# Patient Record
Sex: Female | Born: 1990
Health system: Southern US, Community
[De-identification: ages and names within clinical notes are randomized; demographics above are authoritative.]

## PROBLEM LIST (undated history)

## (undated) DIAGNOSIS — O4402 Placenta previa specified as without hemorrhage, second trimester: Secondary | ICD-10-CM

## (undated) DIAGNOSIS — Z9189 Other specified personal risk factors, not elsewhere classified: Secondary | ICD-10-CM

## (undated) DIAGNOSIS — N926 Irregular menstruation, unspecified: Secondary | ICD-10-CM

## (undated) DIAGNOSIS — R51 Headache: Secondary | ICD-10-CM

## (undated) DIAGNOSIS — I499 Cardiac arrhythmia, unspecified: Secondary | ICD-10-CM

## (undated) DIAGNOSIS — G43909 Migraine, unspecified, not intractable, without status migrainosus: Secondary | ICD-10-CM

## (undated) DIAGNOSIS — R7612 Nonspecific reaction to cell mediated immunity measurement of gamma interferon antigen response without active tuberculosis: Secondary | ICD-10-CM

## (undated) DIAGNOSIS — R519 Headache, unspecified: Secondary | ICD-10-CM

## (undated) HISTORY — DX: Headache, unspecified: R51.9

## (undated) HISTORY — PX: WISDOM TOOTH EXTRACTION: SHX21

## (undated) HISTORY — DX: Migraine, unspecified, not intractable, without status migrainosus: G43.909

## (undated) HISTORY — DX: Other specified personal risk factors, not elsewhere classified: Z91.89

## (undated) HISTORY — DX: Headache: R51

## (undated) HISTORY — DX: Nonspecific reaction to cell mediated immunity measurement of gamma interferon antigen response without active tuberculosis: R76.12

## (undated) HISTORY — DX: Irregular menstruation, unspecified: N92.6

---
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
## 2013-03-16 ENCOUNTER — Emergency Department (HOSPITAL_COMMUNITY)
Admission: EM | Admit: 2013-03-16 | Discharge: 2013-03-16 | Disposition: A | Discharge status: Home / Self Care | Payer: No Typology Code available for payment source | Attending: Emergency Medicine | Admitting: Emergency Medicine

## 2013-03-16 ENCOUNTER — Encounter (HOSPITAL_COMMUNITY): Payer: Self-pay | Admitting: Emergency Medicine

## 2013-03-16 DIAGNOSIS — S0993XA Unspecified injury of face, initial encounter: Secondary | ICD-10-CM | POA: Insufficient documentation

## 2013-03-16 DIAGNOSIS — Y92410 Unspecified street and highway as the place of occurrence of the external cause: Secondary | ICD-10-CM | POA: Insufficient documentation

## 2013-03-16 DIAGNOSIS — Y9389 Activity, other specified: Secondary | ICD-10-CM | POA: Insufficient documentation

## 2013-03-16 DIAGNOSIS — M542 Cervicalgia: Secondary | ICD-10-CM

## 2013-03-16 DIAGNOSIS — Z79899 Other long term (current) drug therapy: Secondary | ICD-10-CM | POA: Insufficient documentation

## 2013-03-16 MED ORDER — CYCLOBENZAPRINE HCL 10 MG PO TABS: 10.0000 mg | ORAL_TABLET | Freq: Two times a day (BID) | ORAL | Status: DC | PRN

## 2013-03-16 NOTE — ED Provider Notes (Signed)
Medical screening examination/treatment/procedure(s) were performed by non-physician practitioner and as supervising physician I was immediately available for consultation/collaboration.   Makynna Manocchio, MD 03/16/13 1455 
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
## 2014-04-13 ENCOUNTER — Other Ambulatory Visit: Payer: Self-pay | Admitting: Occupational Medicine

## 2014-04-13 ENCOUNTER — Ambulatory Visit: Payer: Self-pay

## 2014-04-13 DIAGNOSIS — R7612 Nonspecific reaction to cell mediated immunity measurement of gamma interferon antigen response without active tuberculosis: Secondary | ICD-10-CM

## 2014-06-30 NOTE — L&D Delivery Note (Signed)
Delivery Note At 6:29 PM a viable female was delivered via Vaginal, Spontaneous Delivery (Presentation: Right Occiput Anterior).  APGAR: 8, 9; weight 8 lb 4.6 oz (3760 g).   Placenta status: Intact, Spontaneous.  Cord: 2 vessels with the following complications: None.  Cord pH: n/a  Anesthesia: Epidural  Episiotomy: None Lacerations: 1st degree Suture Repair: 4-0 Vicryl Est. Blood Loss (mL):    Mom to postpartum.  Baby to Couplet care / Skin to Skin.  Brennan Bailey 06/24/2015, 6:58 PM   I have reviewed the record and concur with patient management and plan. Halle Davlin, Hassell Done, MD, Cherlynn June

## 2014-11-20 ENCOUNTER — Encounter (HOSPITAL_COMMUNITY): Payer: Self-pay | Admitting: Emergency Medicine

## 2014-11-20 ENCOUNTER — Emergency Department (HOSPITAL_COMMUNITY)
Admission: EM | Admit: 2014-11-20 | Discharge: 2014-11-20 | Disposition: A | Discharge status: Home / Self Care | Payer: 59 | Source: Home / Self Care | Attending: Family Medicine | Admitting: Family Medicine

## 2014-11-20 DIAGNOSIS — J029 Acute pharyngitis, unspecified: Secondary | ICD-10-CM

## 2014-11-20 LAB — POCT RAPID STREP A: STREPTOCOCCUS, GROUP A SCREEN (DIRECT): NEGATIVE

## 2014-11-20 MED ORDER — AMOXICILLIN 500 MG PO CAPS: 500.0000 mg | ORAL_CAPSULE | Freq: Three times a day (TID) | ORAL | Status: DC

## 2014-11-20 NOTE — Discharge Instructions (Signed)
Thank you for coming in today. Call or go to the emergency room if you get worse, have trouble breathing, have chest pains, or palpitations.  Take amoxicillin if not better.  Continue tylenol or ibuprofen as needed.   Pharyngitis Pharyngitis is redness, pain, and swelling (inflammation) of your pharynx.  CAUSES  Pharyngitis is usually caused by infection. Most of the time, these infections are from viruses (viral) and are part of a cold. However, sometimes pharyngitis is caused by bacteria (bacterial). Pharyngitis can also be caused by allergies. Viral pharyngitis may be spread from person to person by coughing, sneezing, and personal items or utensils (cups, forks, spoons, toothbrushes). Bacterial pharyngitis may be spread from person to person by more intimate contact, such as kissing.  SIGNS AND SYMPTOMS  Symptoms of pharyngitis include:   Sore throat.   Tiredness (fatigue).   Low-grade fever.   Headache.  Joint pain and muscle aches.  Skin rashes.  Swollen lymph nodes.  Plaque-like film on throat or tonsils (often seen with bacterial pharyngitis). DIAGNOSIS  Your health care provider will ask you questions about your illness and your symptoms. Your medical history, along with a physical exam, is often all that is needed to diagnose pharyngitis. Sometimes, a rapid strep test is done. Other lab tests may also be done, depending on the suspected cause.  TREATMENT  Viral pharyngitis will usually get better in 3-4 days without the use of medicine. Bacterial pharyngitis is treated with medicines that kill germs (antibiotics).  HOME CARE INSTRUCTIONS   Drink enough water and fluids to keep your urine clear or pale yellow.   Only take over-the-counter or prescription medicines as directed by your health care provider:   If you are prescribed antibiotics, make sure you finish them even if you start to feel better.   Do not take aspirin.   Get lots of rest.   Gargle with  8 oz of salt water ( tsp of salt per 1 qt of water) as often as every 1-2 hours to soothe your throat.   Throat lozenges (if you are not at risk for choking) or sprays may be used to soothe your throat. SEEK MEDICAL CARE IF:   You have large, tender lumps in your neck.  You have a rash.  You cough up green, yellow-brown, or bloody spit. SEEK IMMEDIATE MEDICAL CARE IF:   Your neck becomes stiff.  You drool or are unable to swallow liquids.  You vomit or are unable to keep medicines or liquids down.  You have severe pain that does not go away with the use of recommended medicines.  You have trouble breathing (not caused by a stuffy nose). MAKE SURE YOU:   Understand these instructions.  Will watch your condition.  Will get help right away if you are not doing well or get worse. Document Released: 06/16/2005 Document Revised: 04/06/2013 Document Reviewed: 02/21/2013 [facility] Patient Information 2015 [place], [place]. This information is not intended to replace advice given to you by your health care provider. Make sure you discuss any questions you have with your health care provider.

## 2014-11-20 NOTE — ED Notes (Signed)
C/o ST onset 2 days associated w/fevers Denies cold sx Alert, no signs of acute distress.

## 2014-11-20 NOTE — ED Provider Notes (Signed)
Janet Thompson is a 24 y.o. female who presents to Urgent Care today for fever and sore throat present for the last few days. No vomiting diarrhea cough or congestion. She is currently [redacted] weeks pregnant. She's tried some Tylenol which helps. 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   History reviewed. No pertinent past medical history. History reviewed. No pertinent past surgical history. History  Substance Use Topics  . Smoking status: Never Smoker   . Smokeless tobacco: Not on file  . Alcohol Use: No   ROS as above Medications: No current facility-administered medications for this encounter.   Current Outpatient Prescriptions  Medication Sig Dispense Refill  . amoxicillin (AMOXIL) 500 MG capsule Take 1 capsule (500 mg total) by mouth 3 (three) times daily. 21 capsule 0  . [DISCONTINUED] Drospirenone-Ethinyl Estradiol-Levomefol (BEYAZ) 3-0.02-0.451 MG tablet Take 1 tablet by mouth daily.     No Known Allergies   Exam:  BP 117/74 mmHg  Pulse 110  Temp(Src) 99.6 F (37.6 C) (Oral)  Resp 16  SpO2 100%  LMP 03/17/2014 Gen: Well NAD HEENT: EOMI,  MMM posterior pharynx is erythematous with exudates bilaterally. Cervical lymphadenopathy is present bilaterally. Normal tympanic membranes bilaterally Lungs: Normal work of breathing. CTABL Heart: Mild tachycardia no MRG Abd: NABS, Soft. Nondistended, Nontender Exts: Brisk capillary refill, warm and well perfused.   Results for orders placed or performed during the hospital encounter of 11/20/14 (from the past 24 hour(s))  POCT rapid strep A [facility])     Status: None   Collection Time: 11/20/14  1:00 PM  Result Value Ref Range   Streptococcus, Group A Screen (Direct) NEGATIVE NEGATIVE   No results found.  Assessment and Plan: 24 y.o. female with pharyngitis. Treat with Tylenol. Prescribe amoxicillin. Patient will take amoxicillin if not better. Culture pending.  Discussed  warning signs or symptoms. Please see discharge instructions. Patient expresses understanding.     Gregor Hams, MD 11/20/14 [phone]

## 2014-11-22 LAB — CULTURE, GROUP A STREP: STREP A CULTURE: NEGATIVE

## 2014-12-25 ENCOUNTER — Encounter: Payer: Self-pay | Admitting: *Deleted

## 2014-12-27 ENCOUNTER — Ambulatory Visit (INDEPENDENT_AMBULATORY_CARE_PROVIDER_SITE_OTHER): Payer: 59 | Admitting: Obstetrics and Gynecology

## 2014-12-27 ENCOUNTER — Encounter: Payer: Self-pay | Admitting: Obstetrics and Gynecology

## 2014-12-27 VITALS — BP 116/76 | HR 79 | Ht 68.0 in | Wt 122.9 lb

## 2014-12-27 DIAGNOSIS — Z3492 Encounter for supervision of normal pregnancy, unspecified, second trimester: Secondary | ICD-10-CM

## 2014-12-27 LAB — POCT URINALYSIS DIPSTICK
Bilirubin, UA: NEGATIVE
Glucose, UA: NEGATIVE
Ketones, UA: NEGATIVE
LEUKOCYTES UA: NEGATIVE
Nitrite, UA: NEGATIVE
PH UA: 5
RBC UA: NEGATIVE
Spec Grav, UA: 1.025
Urobilinogen, UA: 0.2

## 2014-12-27 NOTE — Progress Notes (Signed)
Pt c/o nausea, fatigue

## 2014-12-27 NOTE — Patient Instructions (Signed)
  Place 10-18 weeks prenatal visit patient instructions here.

## 2014-12-27 NOTE — Progress Notes (Signed)
NOB PE & labs- 24yo MWF happy about current pregnancy; G1P0000; reviewed routine prenatal visit. Desires panarama and CFP- obtained today. 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  NEW OB HISTORY AND PHYSICAL  SUBJECTIVE:       Janet Thompson is a 24 y.o. G1P0 female, Patient's last menstrual period was 09/26/2014 (lmp unknown)., Estimated Date of Delivery: 07/03/15, [redacted]w[redacted]d, presents today for establishment of Prenatal Care. She has no unusual complaints and complains of nausea without vomiting for entire first trimester      Gynecologic History Patient's last menstrual period was 09/26/2014 (lmp unknown). Normal Contraception: none Last Pap: 2014. Results were: normal  Obstetric History OB History  Gravida Para Term Preterm AB SAB TAB Ectopic Multiple Living  1             # Outcome Date GA Lbr Len/2nd Weight Sex Delivery Anes PTL Lv  1 Current               Past Medical History  Diagnosis Date  . Menstrual irregularity     Past Surgical History  Procedure Laterality Date  . Wisdom tooth extraction      Current Outpatient Prescriptions on File Prior to Visit  Medication Sig Dispense Refill  . Prenatal Vit-Fe Fumarate-FA (PRENATAL MULTIVITAMIN) TABS tablet Take 1 tablet by mouth daily at 12 noon.    Marland Kitchen amoxicillin (AMOXIL) 500 MG capsule Take 1 capsule (500 mg total) by mouth 3 (three) times daily. (Patient not taking: Reported on 12/27/2014) 21 capsule 0  . [DISCONTINUED] Drospirenone-Ethinyl Estradiol-Levomefol (BEYAZ) 3-0.02-0.451 MG tablet Take 1 tablet by mouth daily.     No current facility-administered medications on file prior to visit.    No Known Allergies  History   Social History  . Marital Status: Single    Spouse Name: N/A  . Number of Children: N/A  . Years of Education: N/A   Occupational History  . Not on file.   Social History Main Topics  . Smoking status: Never Smoker   . Smokeless tobacco: Never  Used  . Alcohol Use: No  . Drug Use: No  . Sexual Activity: Yes    Birth Control/ Protection: None   Other Topics Concern  . Not on file   Social History Narrative    Family History  Problem Relation Age of Onset  . Cancer Maternal Aunt   . Alcohol abuse Maternal Grandfather     colon    The following portions of the patient's history were reviewed and updated as appropriate: allergies, current medications, past OB history, past medical history, past surgical history, past family history, past social history, and problem list.    OBJECTIVE: Initial Physical Exam (New OB)  GENERAL APPEARANCE: alert, well appearing, in no apparent distress HEAD: normocephalic, atraumatic MOUTH: mucous membranes moist, pharynx normal without lesions THYROID: no thyromegaly or masses present BREASTS: no masses noted, no significant tenderness, no palpable axillary nodes, no skin changes LUNGS: clear to auscultation, no wheezes, rales or rhonchi, symmetric air entry HEART: regular rate and rhythm, no murmurs ABDOMEN: soft, nontender, nondistended, no abnormal masses, no epigastric pain EXTREMITIES: no redness or tenderness in the calves or thighs SKIN: normal coloration and turgor, no rashes LYMPH NODES: no adenopathy palpable NEUROLOGIC: alert, oriented, normal speech, no focal findings or movement disorder noted  PELVIC EXAM EXTERNAL GENITALIA: normal appearing vulva with no masses, tenderness or lesions VAGINA: no abnormal discharge or lesions CERVIX: no lesions or cervical  motion tenderness UTERUS: gravid and consistent with 13 weeks ADNEXA: no masses palpable and nontender  ASSESSMENT: Normal pregnancy  PLAN: Prenatal care See orders

## 2014-12-28 ENCOUNTER — Other Ambulatory Visit: Payer: 59

## 2014-12-28 ENCOUNTER — Other Ambulatory Visit: Payer: Self-pay | Admitting: *Deleted

## 2014-12-28 DIAGNOSIS — Z3492 Encounter for supervision of normal pregnancy, unspecified, second trimester: Secondary | ICD-10-CM

## 2014-12-30 LAB — URINE CULTURE: ORGANISM ID, BACTERIA: NO GROWTH

## 2014-12-30 LAB — HEP, RPR, HIV PANEL
HIV SCREEN 4TH GENERATION: NONREACTIVE
Hepatitis B Surface Ag: NEGATIVE
RPR Ser Ql: NONREACTIVE

## 2014-12-30 LAB — CBC WITH DIFFERENTIAL/PLATELET
BASOS ABS: 0 10*3/uL (ref 0.0–0.2)
BASOS: 1 %
EOS (ABSOLUTE): 0.1 10*3/uL (ref 0.0–0.4)
Eos: 1 %
HEMOGLOBIN: 13.4 g/dL (ref 11.1–15.9)
Hematocrit: 39.2 % (ref 34.0–46.6)
IMMATURE GRANULOCYTES: 0 %
Immature Grans (Abs): 0 10*3/uL (ref 0.0–0.1)
LYMPHS ABS: 1.5 10*3/uL (ref 0.7–3.1)
LYMPHS: 24 %
MCH: 30.7 pg (ref 26.6–33.0)
MCHC: 34.2 g/dL (ref 31.5–35.7)
MCV: 90 fL (ref 79–97)
MONOCYTES: 6 %
MONOS ABS: 0.4 10*3/uL (ref 0.1–0.9)
Neutrophils Absolute: 4.1 10*3/uL (ref 1.4–7.0)
Neutrophils: 68 %
PLATELETS: 220 10*3/uL (ref 150–379)
RBC: 4.36 x10E6/uL (ref 3.77–5.28)
RDW: 13.8 % (ref 12.3–15.4)
WBC: 6.1 10*3/uL (ref 3.4–10.8)

## 2014-12-30 LAB — URINALYSIS, ROUTINE W REFLEX MICROSCOPIC
BILIRUBIN UA: NEGATIVE
GLUCOSE, UA: NEGATIVE
Ketones, UA: NEGATIVE
LEUKOCYTES UA: NEGATIVE
NITRITE UA: NEGATIVE
PH UA: 7.5 (ref 5.0–7.5)
PROTEIN UA: NEGATIVE
RBC, UA: NEGATIVE
Specific Gravity, UA: 1.016 (ref 1.005–1.030)
Urobilinogen, Ur: 0.2 mg/dL (ref 0.2–1.0)

## 2014-12-30 LAB — RUBELLA SCREEN: Rubella Antibodies, IGG: 1.32 index (ref >0.99)

## 2014-12-30 LAB — ABO AND RH: RH TYPE: POSITIVE

## 2014-12-30 LAB — VARICELLA ZOSTER ANTIBODY, IGG: VARICELLA: 487 {index} (ref >165)

## 2014-12-30 LAB — ANTIBODY SCREEN: ANTIBODY SCREEN: NEGATIVE

## 2015-01-02 LAB — GC/CHLAMYDIA PROBE AMP
CHLAMYDIA, DNA PROBE: NEGATIVE
NEISSERIA GONORRHOEAE BY PCR: NEGATIVE

## 2015-01-03 ENCOUNTER — Encounter: Payer: Self-pay | Admitting: Obstetrics and Gynecology

## 2015-01-11 ENCOUNTER — Encounter: Payer: Self-pay | Admitting: Obstetrics and Gynecology

## 2015-01-24 ENCOUNTER — Encounter: Payer: Self-pay | Admitting: Obstetrics and Gynecology

## 2015-01-24 ENCOUNTER — Ambulatory Visit (INDEPENDENT_AMBULATORY_CARE_PROVIDER_SITE_OTHER): Payer: 59 | Admitting: Obstetrics and Gynecology

## 2015-01-24 VITALS — BP 109/76 | HR 101 | Wt 123.5 lb

## 2015-01-24 DIAGNOSIS — Z3492 Encounter for supervision of normal pregnancy, unspecified, second trimester: Secondary | ICD-10-CM

## 2015-01-24 LAB — POCT URINALYSIS DIPSTICK
Blood, UA: NEGATIVE
GLUCOSE UA: NEGATIVE
Ketones, UA: NEGATIVE
LEUKOCYTES UA: NEGATIVE
Nitrite, UA: NEGATIVE
PH UA: 6
Spec Grav, UA: 1.015
Urobilinogen, UA: 0.2

## 2015-01-24 NOTE — Progress Notes (Signed)
Pt is doing well, denies any complaints

## 2015-01-24 NOTE — Progress Notes (Signed)
ROB- reviewed labs- all normal- panarama revealed low risk female infant. Anatomy scan next visit.

## 2015-02-09 ENCOUNTER — Ambulatory Visit: Payer: 59

## 2015-02-09 DIAGNOSIS — Z3492 Encounter for supervision of normal pregnancy, unspecified, second trimester: Secondary | ICD-10-CM

## 2015-02-21 ENCOUNTER — Other Ambulatory Visit: Payer: Self-pay | Admitting: Obstetrics and Gynecology

## 2015-02-21 DIAGNOSIS — Q638 Other specified congenital malformations of kidney: Secondary | ICD-10-CM

## 2015-02-23 ENCOUNTER — Ambulatory Visit (INDEPENDENT_AMBULATORY_CARE_PROVIDER_SITE_OTHER): Payer: 59 | Admitting: Obstetrics and Gynecology

## 2015-02-23 ENCOUNTER — Other Ambulatory Visit: Payer: 59

## 2015-02-23 ENCOUNTER — Encounter: Payer: Self-pay | Admitting: Obstetrics and Gynecology

## 2015-02-23 VITALS — BP 101/71 | HR 90 | Wt 129.4 lb

## 2015-02-23 DIAGNOSIS — Q620 Congenital hydronephrosis: Secondary | ICD-10-CM | POA: Diagnosis not present

## 2015-02-23 DIAGNOSIS — Z331 Pregnant state, incidental: Secondary | ICD-10-CM | POA: Diagnosis not present

## 2015-02-23 DIAGNOSIS — Q638 Other specified congenital malformations of kidney: Secondary | ICD-10-CM

## 2015-02-23 LAB — POCT URINALYSIS DIPSTICK
Bilirubin, UA: NEGATIVE
Glucose, UA: NEGATIVE
Ketones, UA: NEGATIVE
LEUKOCYTES UA: NEGATIVE
NITRITE UA: NEGATIVE
PH UA: 6
RBC UA: NEGATIVE
Spec Grav, UA: 1.015
UROBILINOGEN UA: 0.2

## 2015-02-23 NOTE — Progress Notes (Signed)
ROB- reviewed previous anatomy scan findings, will refer to [facility] Follow-up scan today reveals: Indications:F/U Anatomy Findings:  Singleton intrauterine pregnancy is visualized with FHR at 153 BPM. Clinically established EDD of 07/03/2015 at 21 3/7. Growth is not performed today.   Fetal presentation is Vertex.  EFW: not performed. Placenta: anterior and remote from cervix. AFI: appears adequate.  Anatomic survey is complete; Gender - female  .   Survey of the adnexa demonstrates no adnexal masses. There is no free peritoneal fluid in the cul de sac.  Right renal pelvis measures 4.45mm and the left renal pelvis measures 5.1 mm.   Impression: 1. 21 3/7 week Viable Singleton Intrauterine pregnancy by LMP. 2. (U/S) EDD is consistent with Clinically established (LMP) EDD of 07/03/2015. 3. Renal pelvis are dilated bilaterally.

## 2015-02-23 NOTE — Progress Notes (Signed)
ROB-denies any changes, pt is doing well

## 2015-02-23 NOTE — Patient Instructions (Signed)
Single umbilical artery     Occasionally, there is only the one single umbilical artery (SUA) present in the umbilical cord.[1] Approximately this affects between 1 in 100 and 1 in 500 pregnancies, making it the most common umbilical abnormality. It is more common in multiple births. Its cause is not known.  Most cords have one vein and two arteries. The vein carries oxygenated blood from the placenta to the baby and the arteries carry deoxygenated blood from the baby to the placenta. In approximately 1% of pregnancies there are only two vessels -usually a single vein and single artery. In about 75% of those cases, the baby is entirely normal and healthy and the missing artery isn't missed at all. One artery can support a pregnancy and does not necessarily indicate problems. For the other 25%, a 2-vessel cord is a sign that the baby has other abnormalities-sometimes life-threatening and sometimes not.[2] SUA does increase the risk of the baby having cardiac, skeletal, intestinal or renal problems.[3] Babies with SUA may have a higher likelihood of having other congenital abnormalities, especially of the heart. However, additional testing (high level ultrasound scans) can rule out many of these abnormalities prior to birth and alleviate parental anxiety. Echocardiograms of the fetus may be advised to ensure the heart is functioning properly. Genetic counseling may be useful, too, especially when weighing the pros and cons of more invasive procedures such as chorionic villus sampling and amniocentesis.  Although the presence of an SUA is a risk factor for additional complications, most fetuses with the condition will not experience other problems, either in utero or after birth. Especially encouraging are cases in which no other soft markers for congenital abnormalities are visible via ultrasound. Prior to ultrasound technology, the only method for determining the presence of a SUA was at birth, following an  examination of the placenta. Given that the vast majority of expectant mothers do not receive the kind of advanced ultrasound scanning required to confirm SUA in utero, most cases may never be detected antenatally even today. Doctors and midwives often suggest parents take the added precaution of having regular growth scans near term to rule out intrauterine growth restriction, which can happen on occasion and warrant intervention. Yet the majority of growth restricted infants with the abnormality also have other defects. Finally, neonates with the finding may also have a higher occurrence of renal problems, therefore close examination of the infant may be warranted shortly after birth. Among SUA infants, there is a slightly elevated risk for post-natal urinary infections. It may be associated with Edwards syndrome.

## 2015-02-28 ENCOUNTER — Other Ambulatory Visit: Payer: Self-pay | Admitting: Obstetrics and Gynecology

## 2015-03-08 ENCOUNTER — Other Ambulatory Visit: Payer: Self-pay

## 2015-03-08 ENCOUNTER — Ambulatory Visit: Payer: 59

## 2015-03-08 DIAGNOSIS — IMO0001 Reserved for inherently not codable concepts without codable children: Secondary | ICD-10-CM

## 2015-03-12 ENCOUNTER — Ambulatory Visit (HOSPITAL_BASED_OUTPATIENT_CLINIC_OR_DEPARTMENT_OTHER)
Admission: RE | Admit: 2015-03-12 | Discharge: 2015-03-12 | Disposition: A | Discharge status: Home / Self Care | Payer: 59 | Source: Ambulatory Visit | Attending: Maternal & Fetal Medicine | Admitting: Maternal & Fetal Medicine

## 2015-03-12 ENCOUNTER — Ambulatory Visit
Admission: RE | Admit: 2015-03-12 | Discharge: 2015-03-12 | Disposition: A | Discharge status: Home / Self Care | Payer: 59 | Source: Ambulatory Visit | Attending: Maternal & Fetal Medicine | Admitting: Maternal & Fetal Medicine

## 2015-03-12 DIAGNOSIS — Z3A23 23 weeks gestation of pregnancy: Secondary | ICD-10-CM | POA: Diagnosis not present

## 2015-03-12 DIAGNOSIS — O35EXX0 Maternal care for other (suspected) fetal abnormality and damage, fetal genitourinary anomalies, not applicable or unspecified: Secondary | ICD-10-CM | POA: Insufficient documentation

## 2015-03-12 DIAGNOSIS — Z3A19 19 weeks gestation of pregnancy: Secondary | ICD-10-CM | POA: Diagnosis not present

## 2015-03-12 DIAGNOSIS — O26892 Other specified pregnancy related conditions, second trimester: Secondary | ICD-10-CM | POA: Insufficient documentation

## 2015-03-12 DIAGNOSIS — IMO0001 Reserved for inherently not codable concepts without codable children: Secondary | ICD-10-CM

## 2015-03-12 DIAGNOSIS — O358XX0 Maternal care for other (suspected) fetal abnormality and damage, not applicable or unspecified: Secondary | ICD-10-CM | POA: Diagnosis not present

## 2015-03-12 DIAGNOSIS — O09899 Supervision of other high risk pregnancies, unspecified trimester: Secondary | ICD-10-CM | POA: Insufficient documentation

## 2015-03-12 NOTE — Progress Notes (Signed)
[employer] Consultation   Chief Complaint: Single umbilical artery and fetal bilateral renal pelviectasis  HPI: [name] is a 24 y.o. G1P0 at [redacted]w[redacted]d by LMP and 19 week [place] who presents in consultation from [facility] for single umbilical artery and fetal bilateral renal pelviectasis noted on 20 week anatomy ultrasound and follow-up anatomy ultrasound 2 weeks later.  Gynecologic History:  Menstrual irregularity  Past Medical History: Benign  Past Surgical History: She  has past surgical history that includes Wisdom tooth extraction.   Medications: PN vitamins  Allergies: Patient has No Known Allergies.   Social History: Patient  reports that she has never smoked. She has never used smokeless tobacco. She reports that she does not drink alcohol or use illicit drugs. 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   Family History: family history includes Alcohol abuse in her maternal grandfather; Cancer in her maternal aunt.   Review of Systems A full 12 point review of systems was negative or as noted in the History of Present Illness.  Physical Exam: T 98.4, HR 92 BP 119/75, Wt 133  On ultrasound today, with the exception of the fetal kidneys, fetal anatomy is visualized and appears normal.  There is bilateral renal pelviectasis measuring 6.6 mm on the left and 6.2 mm on the right.  Only one umbilical artery was visualized.  Asessement:24 yo gravida 1 at [redacted]w[redacted]d with: 1. Bilateral fetal renal pelviectasis 2. Single umbilical artery 3. Consideration of underlying genetic cause(s) of above  Recommendations: 1. Bilateral fetal renal pelviectasis -- The patient and her husband were counseled that renal pelviectasis is a common finding that can improve, worsen or remain stable over the course of the pregnancy.  They were counseled that while this is a common finding, the finding may be more common in aneuploidy.  In this  pregnancy, however, cffDNA has been normal.   -- The couple were counseled that the finding can be due to normal variation, a functional problem, or a mechanical obstruction.   Should the pelviectasis persist  throughout pregnancy, we would recommend postnatal follow-up.  Occasionally, postnatal treatment is required, but rarely is surgery necessary. Should the pelviectasis devolve into overt hydronephrosis, we would suggest consultation with pediatric urology during pregnancy.  2. Single umbilical artery -- The couple were counseled about the association with utero-placental insufficiency.  We would recommend monthly ultrasounds for growth and weekly testing starting at 34-36 weeks gesation with delivery by 40 weeks. -- Because of the pelviectasis, we have scheduled her next ultrasound for growth here in 4-5 weeks.  -- The couple were offered fetal echocardiography to more completely exclude a fetal heart defect and declined.   3. Consideration of underlying genetic cause(s) of  --  The couple was introduced to our genetic counselor and were offered genetic amniocentesis to more completely exclude aneuploidy.  They declined.    Total time spent with the patient was 30 minutes with greater than 50% spent in counseling and coordination of care.   Erasmo Score, MD [facility]

## 2015-03-21 ENCOUNTER — Ambulatory Visit (INDEPENDENT_AMBULATORY_CARE_PROVIDER_SITE_OTHER): Payer: 59 | Admitting: Obstetrics and Gynecology

## 2015-03-21 ENCOUNTER — Encounter: Payer: 59 | Admitting: Obstetrics and Gynecology

## 2015-03-21 VITALS — BP 105/74 | HR 74 | Wt 133.5 lb

## 2015-03-21 DIAGNOSIS — Z23 Encounter for immunization: Secondary | ICD-10-CM

## 2015-03-21 MED ORDER — INFLUENZA VAC SPLIT QUAD 0.5 ML IM SUSY
0.5000 mL | PREFILLED_SYRINGE | Freq: Once | INTRAMUSCULAR | Status: AC
  Administered 2015-03-21: 0.5 mL via INTRAMUSCULAR

## 2015-03-21 NOTE — Progress Notes (Signed)
ROB- reviewed info from [facility] visit- no questions; glucola next visit

## 2015-03-21 NOTE — Progress Notes (Signed)
Flu vaccine today 

## 2015-03-30 ENCOUNTER — Encounter: Payer: 59 | Admitting: Obstetrics and Gynecology

## 2015-04-10 ENCOUNTER — Other Ambulatory Visit: Payer: Self-pay | Admitting: *Deleted

## 2015-04-10 DIAGNOSIS — Z3493 Encounter for supervision of normal pregnancy, unspecified, third trimester: Secondary | ICD-10-CM

## 2015-04-10 DIAGNOSIS — Z131 Encounter for screening for diabetes mellitus: Secondary | ICD-10-CM

## 2015-04-11 ENCOUNTER — Ambulatory Visit (INDEPENDENT_AMBULATORY_CARE_PROVIDER_SITE_OTHER): Payer: 59 | Admitting: Obstetrics and Gynecology

## 2015-04-11 ENCOUNTER — Encounter: Payer: Self-pay | Admitting: Obstetrics and Gynecology

## 2015-04-11 ENCOUNTER — Encounter: Payer: 59 | Admitting: Obstetrics and Gynecology

## 2015-04-11 VITALS — BP 114/64 | HR 87 | Wt 138.9 lb

## 2015-04-11 DIAGNOSIS — Z23 Encounter for immunization: Secondary | ICD-10-CM

## 2015-04-11 DIAGNOSIS — Z3493 Encounter for supervision of normal pregnancy, unspecified, third trimester: Secondary | ICD-10-CM | POA: Diagnosis not present

## 2015-04-11 DIAGNOSIS — Z131 Encounter for screening for diabetes mellitus: Secondary | ICD-10-CM

## 2015-04-11 LAB — POCT URINALYSIS DIPSTICK
Bilirubin, UA: NEGATIVE
Blood, UA: NEGATIVE
GLUCOSE UA: NEGATIVE
KETONES UA: NEGATIVE
Leukocytes, UA: NEGATIVE
Nitrite, UA: NEGATIVE
PH UA: 7
SPEC GRAV UA: 1.015
Urobilinogen, UA: 0.2

## 2015-04-11 MED ORDER — TETANUS-DIPHTH-ACELL PERTUSSIS 5-2.5-18.5 LF-MCG/0.5 IM SUSP
0.5000 mL | Freq: Once | INTRAMUSCULAR | Status: AC
  Administered 2015-04-11: 0.5 mL via INTRAMUSCULAR

## 2015-04-11 NOTE — Progress Notes (Cosign Needed)
ROB-pt denies any new complaints, tdap given and blood consent signed, glucola done Pt actually left due to MNB @ l/d, will make another appt

## 2015-04-12 ENCOUNTER — Ambulatory Visit (INDEPENDENT_AMBULATORY_CARE_PROVIDER_SITE_OTHER): Payer: 59 | Admitting: Obstetrics and Gynecology

## 2015-04-12 ENCOUNTER — Encounter: Payer: Self-pay | Admitting: Obstetrics and Gynecology

## 2015-04-12 ENCOUNTER — Telehealth: Payer: Self-pay | Admitting: *Deleted

## 2015-04-12 VITALS — BP 112/67 | HR 87 | Wt 136.9 lb

## 2015-04-12 DIAGNOSIS — Z3493 Encounter for supervision of normal pregnancy, unspecified, third trimester: Secondary | ICD-10-CM

## 2015-04-12 LAB — POCT URINALYSIS DIPSTICK
BILIRUBIN UA: NEGATIVE
Blood, UA: NEGATIVE
GLUCOSE UA: NEGATIVE
Ketones, UA: NEGATIVE
LEUKOCYTES UA: NEGATIVE
NITRITE UA: NEGATIVE
Spec Grav, UA: 1.015
UROBILINOGEN UA: 0.2
pH, UA: 6.5

## 2015-04-12 LAB — HEMOGLOBIN AND HEMATOCRIT, BLOOD
Hematocrit: 33.4 % — ABNORMAL LOW (ref 34.0–46.6)
Hemoglobin: 11.2 g/dL (ref 11.1–15.9)

## 2015-04-12 LAB — GLUCOSE, 1 HOUR GESTATIONAL: Gestational Diabetes Screen: 118 mg/dL (ref 65–139)

## 2015-04-12 NOTE — Telephone Encounter (Signed)
Notified pt of normal lab results 

## 2015-04-12 NOTE — Progress Notes (Signed)
ROB- denies any new complaints

## 2015-04-12 NOTE — Progress Notes (Signed)
ROB- glucola done yesterday; info on cord blood donation given

## 2015-04-12 NOTE — Telephone Encounter (Signed)
-----   Message from Evonnie Pat, [place] sent at 04/12/2015  9:47 AM EDT ----- Please let her know she passed her glucola and no signs of anemia

## 2015-04-16 ENCOUNTER — Ambulatory Visit: Payer: 59

## 2015-04-23 ENCOUNTER — Other Ambulatory Visit: Payer: Self-pay

## 2015-04-23 ENCOUNTER — Ambulatory Visit
Admission: RE | Admit: 2015-04-23 | Discharge: 2015-04-23 | Disposition: A | Discharge status: Home / Self Care | Payer: 59 | Source: Ambulatory Visit | Attending: Obstetrics and Gynecology | Admitting: Obstetrics and Gynecology

## 2015-04-23 VITALS — BP 123/68 | HR 82 | Temp 98.0°F | Wt 139.0 lb

## 2015-04-23 DIAGNOSIS — O358XX0 Maternal care for other (suspected) fetal abnormality and damage, not applicable or unspecified: Secondary | ICD-10-CM | POA: Insufficient documentation

## 2015-04-23 DIAGNOSIS — O35EXX0 Maternal care for other (suspected) fetal abnormality and damage, fetal genitourinary anomalies, not applicable or unspecified: Secondary | ICD-10-CM

## 2015-04-23 DIAGNOSIS — IMO0001 Reserved for inherently not codable concepts without codable children: Secondary | ICD-10-CM

## 2015-04-23 DIAGNOSIS — Z3A29 29 weeks gestation of pregnancy: Secondary | ICD-10-CM | POA: Diagnosis not present

## 2015-04-23 DIAGNOSIS — O35EXX1 Maternal care for other (suspected) fetal abnormality and damage, fetal genitourinary anomalies, fetus 1: Secondary | ICD-10-CM

## 2015-04-23 DIAGNOSIS — O358XX1 Maternal care for other (suspected) fetal abnormality and damage, fetus 1: Secondary | ICD-10-CM

## 2015-04-27 ENCOUNTER — Encounter: Payer: Self-pay | Admitting: Obstetrics and Gynecology

## 2015-04-27 ENCOUNTER — Ambulatory Visit (INDEPENDENT_AMBULATORY_CARE_PROVIDER_SITE_OTHER): Payer: 59 | Admitting: Obstetrics and Gynecology

## 2015-04-27 VITALS — BP 98/68 | HR 118 | Wt 138.9 lb

## 2015-04-27 DIAGNOSIS — Z3493 Encounter for supervision of normal pregnancy, unspecified, third trimester: Secondary | ICD-10-CM

## 2015-04-27 LAB — POCT URINALYSIS DIPSTICK
Bilirubin, UA: NEGATIVE
Blood, UA: NEGATIVE
GLUCOSE UA: NEGATIVE
Ketones, UA: NEGATIVE
LEUKOCYTES UA: NEGATIVE
NITRITE UA: NEGATIVE
Spec Grav, UA: 1.015
UROBILINOGEN UA: 0.2
pH, UA: 6

## 2015-04-27 NOTE — Progress Notes (Signed)
ROB- doing well.

## 2015-04-27 NOTE — Progress Notes (Signed)
ROB-pt denies any new complaints

## 2015-05-09 ENCOUNTER — Ambulatory Visit (INDEPENDENT_AMBULATORY_CARE_PROVIDER_SITE_OTHER): Payer: 59 | Admitting: Obstetrics and Gynecology

## 2015-05-09 ENCOUNTER — Encounter: Payer: Self-pay | Admitting: Obstetrics and Gynecology

## 2015-05-09 VITALS — BP 103/69 | HR 94 | Wt 143.0 lb

## 2015-05-09 DIAGNOSIS — Z3493 Encounter for supervision of normal pregnancy, unspecified, third trimester: Secondary | ICD-10-CM

## 2015-05-09 LAB — POCT URINALYSIS DIPSTICK
BILIRUBIN UA: NEGATIVE
Blood, UA: NEGATIVE
Glucose, UA: NEGATIVE
KETONES UA: NEGATIVE
NITRITE UA: NEGATIVE
PH UA: 6.5
Spec Grav, UA: 1.015
Urobilinogen, UA: 0.2

## 2015-05-09 NOTE — Progress Notes (Signed)
No complaints

## 2015-05-21 ENCOUNTER — Ambulatory Visit: Payer: Self-pay

## 2015-05-22 ENCOUNTER — Ambulatory Visit: Payer: 59

## 2015-05-22 ENCOUNTER — Ambulatory Visit (INDEPENDENT_AMBULATORY_CARE_PROVIDER_SITE_OTHER): Payer: 59 | Admitting: Obstetrics and Gynecology

## 2015-05-22 VITALS — BP 123/84 | HR 113 | Wt 144.2 lb

## 2015-05-22 DIAGNOSIS — O26899 Other specified pregnancy related conditions, unspecified trimester: Secondary | ICD-10-CM

## 2015-05-22 DIAGNOSIS — R12 Heartburn: Secondary | ICD-10-CM

## 2015-05-22 DIAGNOSIS — Z331 Pregnant state, incidental: Secondary | ICD-10-CM

## 2015-05-22 DIAGNOSIS — Z3493 Encounter for supervision of normal pregnancy, unspecified, third trimester: Secondary | ICD-10-CM

## 2015-05-22 DIAGNOSIS — Z3490 Encounter for supervision of normal pregnancy, unspecified, unspecified trimester: Secondary | ICD-10-CM

## 2015-05-22 LAB — POCT URINALYSIS DIPSTICK
Bilirubin, UA: NEGATIVE
Glucose, UA: NEGATIVE
KETONES UA: NEGATIVE
Leukocytes, UA: NEGATIVE
Nitrite, UA: NEGATIVE
RBC UA: NEGATIVE
SPEC GRAV UA: 1.015
UROBILINOGEN UA: 0.2
pH, UA: 6

## 2015-05-22 MED ORDER — RANITIDINE HCL 150 MG PO TABS: 150.0000 mg | ORAL_TABLET | Freq: Two times a day (BID) | ORAL | Status: DC

## 2015-05-22 NOTE — Patient Instructions (Addendum)
Nonstress Test The nonstress test is a procedure that monitors the fetus's heartbeat. The test will monitor the heartbeat when the fetus is at rest and while the fetus is moving. In a healthy fetus, there will be an increase in fetal heart rate when the fetus moves or kicks. The heart rate will decrease at rest. This test helps determine if the fetus is healthy. Your health care provider will look at a number of patterns in the heart rate tracing to make sure your baby is thriving. If there is concern, your health care provider may order additional tests or may suggest another course of action. This test is often done in the third trimester and can help determine if an early delivery is needed and safe. Common reasons to have this test are:  You are past your due date.  You have a high-risk pregnancy.  You are feeling less movement than normal.  You have lost a pregnancy in the past.  Your health care provider suspects fetal growth problems.  You have too much or too little amniotic fluid. BEFORE THE PROCEDURE  Eat a meal right before the test or as directed by your health care provider. Food may help stimulate fetal movements.  Use the restroom right before the test. PROCEDURE  Two belts will be placed around your abdomen. These belts have monitors attached to them. One records the fetal heart rate and the other records uterine contractions.  You may be asked to lie down on your side or to stay sitting upright.  You may be given a button to press when you feel movement.  The fetal heartbeat is listened to and watched on a screen. The heartbeat is recorded on a sheet of paper.  If the fetus seems to be sleeping, you may be asked to drink some juice or soda, gently press your abdomen, or make some noise to wake the fetus. AFTER THE PROCEDURE  Your health care provider will discuss the test results with you and make recommendations for the near future.   This information is not  intended to replace advice given to you by your health care provider. Make sure you discuss any questions you have with your health care provider.   Document Released: 06/06/2002 Document Revised: 07/07/2014 Document Reviewed: 07/20/2012 Elsevier Interactive Patient Education 2016 Elsevier Inc[employer]   Heartburn During Pregnancy Heartburn is a burning sensation in the chest caused by stomach acid backing up into the esophagus. Heartburn is common in pregnancy because a certain hormone (progesterone) is released when a woman is pregnant. The progesterone hormone may relax the valve that separates the esophagus from the stomach. This allows acid to go up into the esophagus, causing heartburn. Heartburn may also happen in pregnancy because the enlarging uterus pushes up on the stomach, which pushes more acid into the esophagus. This is especially true in the later stages of pregnancy. Heartburn problems usually go away after giving birth. CAUSES  Heartburn is caused by stomach acid backing up into the esophagus. During pregnancy, this may result from various things, including:   The progesterone hormone.  Changing hormone levels.  The growing uterus pushing stomach acid upward.  Large meals.  Certain foods and drinks.  Exercise.  Increased acid production. SIGNS AND SYMPTOMS   Burning pain in the chest or lower throat.  Bitter taste in the mouth.  Coughing. DIAGNOSIS  Your health care provider will typically diagnose heartburn by taking a careful history of your concern. Blood tests may be done  to check for a certain type of bacteria that is associated with heartburn. Sometimes, heartburn is diagnosed by prescribing a heartburn medicine to see if the symptoms improve. In some cases, a procedure called an endoscopy may be done. In this procedure, a tube with a light and a camera on the end (endoscope) is used to examine the esophagus and the stomach. TREATMENT  Treatment will vary depending  on the severity of your symptoms. Your health care provider may recommend:  Over-the-counter medicines (antacids, acid reducers) for mild heartburn.  Prescription medicines to decrease stomach acid or to protect your stomach lining.  Certain changes in your diet.  Elevating the head of your bed by putting blocks under the legs. This helps prevent stomach acid from backing up into the esophagus when you are lying down. HOME CARE INSTRUCTIONS   Only take over-the-counter or prescription medicines as directed by your health care provider.  Raise the head of your bed by putting blocks under the legs if instructed to do so by your health care provider. Sleeping with more pillows is not effective because it only changes the position of your head.  Do not exercise right after eating.  Avoid eating 2-3 hours before bed. Do not lie down right after eating.  Eat small meals throughout the day instead of three large meals.  Identify foods and beverages that make your symptoms worse and avoid them. Foods you may want to avoid include:  Peppers.  Chocolate.  High-fat foods, including fried foods.  Spicy foods.  Garlic and onions.  Citrus fruits, including oranges, grapefruit, lemons, and limes.  Food containing tomatoes or tomato products.  Mint.  Carbonated and caffeinated drinks.  Vinegar. SEEK MEDICAL CARE IF:  You have abdominal pain of any kind.  You feel burning in your upper abdomen or chest, especially after eating or lying down.  You have nausea and vomiting.  Your stomach feels upset after you eat. SEEK IMMEDIATE MEDICAL CARE IF:   You have severe chest pain that goes down your arm or into your jaw or neck.  You feel sweaty, dizzy, or light-headed.  You become short of breath.  You vomit blood.  You have difficulty or pain with swallowing.  You have bloody or black, tarry stools.  You have episodes of heartburn more than 3 times a week, for more than 2  weeks. MAKE SURE YOU:  Understand these instructions.  Will watch your condition.  Will get help right away if you are not doing well or get worse.   This information is not intended to replace advice given to you by your health care provider. Make sure you discuss any questions you have with your health care provider.   Document Released: 06/13/2000 Document Revised: 07/07/2014 Document Reviewed: 02/02/2013 Elsevier Interactive Patient Education [employer].

## 2015-05-22 NOTE — Progress Notes (Signed)
ROB-Fatigue has gotten worse over the past 2 weeks. Having reflux and taking otc med, also having SOB and full discomfort after eating

## 2015-05-22 NOTE — Progress Notes (Signed)
Indications:Growth, AFI SUA Findings:  Singleton intrauterine pregnancy is visualized with FHR at 139 BPM. Biometrics give an (U/S) Gestational age of 32 1/7 weeks and an (U/S) EDD of 06/18/15; this does NOT correlate with the clinically established EDD of 07/03/15.  Fetal presentation is Vertex.  EFW: 2777g (6 lb 2 oz). Placenta: Anterior, Grade 1, Remote to Cervix.. AFI: 16.1cm. Dilated Left renal pelvis =4.20mm Survey of the adnexa demonstrates no adnexal masses. There is no free peritoneal fluid in the cul de sac.  Impression: 1. 36 1/7 week Viable Singleton Intrauterine pregnancy by U/S. 2. (U/S) EDD is NOT consistent with Clinically established (LMP) EDD of 07/03/15.   Scan reviewed- will repeat in 4 weeks, will start weekly NST next week. To elevate HOB and start Zantac 150mg  bid (RX sent in)

## 2015-05-29 ENCOUNTER — Encounter: Payer: Self-pay | Admitting: Certified Nurse Midwife

## 2015-05-29 ENCOUNTER — Ambulatory Visit (INDEPENDENT_AMBULATORY_CARE_PROVIDER_SITE_OTHER): Payer: 59 | Admitting: Certified Nurse Midwife

## 2015-05-29 VITALS — BP 102/74 | HR 117 | Wt 144.6 lb

## 2015-05-29 DIAGNOSIS — Z331 Pregnant state, incidental: Secondary | ICD-10-CM

## 2015-05-29 DIAGNOSIS — IMO0001 Reserved for inherently not codable concepts without codable children: Secondary | ICD-10-CM

## 2015-05-29 DIAGNOSIS — Z3490 Encounter for supervision of normal pregnancy, unspecified, unspecified trimester: Secondary | ICD-10-CM

## 2015-05-29 LAB — POCT URINALYSIS DIPSTICK
Bilirubin, UA: NEGATIVE
GLUCOSE UA: NEGATIVE
Ketones, UA: NEGATIVE
NITRITE UA: NEGATIVE
PH UA: 6
Protein, UA: NEGATIVE
RBC UA: NEGATIVE
Spec Grav, UA: 1.025
Urobilinogen, UA: NEGATIVE

## 2015-05-29 NOTE — Progress Notes (Signed)
NST completed and reactive.  No decelerations.  Fetus in breech presentation.  Discussed Breech exercises.  GBS next visit.  Growth sonogram scheduled for 38 weeks.

## 2015-05-29 NOTE — Progress Notes (Signed)
Pt having some lower right back pain for past couple of weeks, worsening. Pain radiates towards right leg when this occurs, stopping her in her tracks. Also states sometimes she feels ? Baby hiccups for about 15-14min at the time.

## 2015-06-06 ENCOUNTER — Encounter: Payer: Self-pay | Admitting: Obstetrics and Gynecology

## 2015-06-06 ENCOUNTER — Ambulatory Visit (INDEPENDENT_AMBULATORY_CARE_PROVIDER_SITE_OTHER): Payer: 59 | Admitting: Obstetrics and Gynecology

## 2015-06-06 VITALS — BP 127/73 | HR 84 | Wt 145.4 lb

## 2015-06-06 DIAGNOSIS — Z3493 Encounter for supervision of normal pregnancy, unspecified, third trimester: Secondary | ICD-10-CM | POA: Diagnosis not present

## 2015-06-06 DIAGNOSIS — Z113 Encounter for screening for infections with a predominantly sexual mode of transmission: Secondary | ICD-10-CM

## 2015-06-06 DIAGNOSIS — Z3685 Encounter for antenatal screening for Streptococcus B: Secondary | ICD-10-CM

## 2015-06-06 DIAGNOSIS — Z36 Encounter for antenatal screening of mother: Secondary | ICD-10-CM

## 2015-06-06 LAB — OB RESULTS CONSOLE GBS
GBS: POSITIVE
GBS: POSITIVE

## 2015-06-06 LAB — POCT URINALYSIS DIPSTICK
Bilirubin, UA: NEGATIVE
GLUCOSE UA: NEGATIVE
Ketones, UA: NEGATIVE
NITRITE UA: NEGATIVE
RBC UA: NEGATIVE
SPEC GRAV UA: 1.02
UROBILINOGEN UA: 0.2
pH, UA: 6

## 2015-06-06 NOTE — Progress Notes (Signed)
ROB & NST-doing ok with severe fatigue after working and low back pain with sciatic nerve pain. To reduce work to not exceed 6h/d and 4d/week- note given. NST reactive.

## 2015-06-06 NOTE — Progress Notes (Signed)
ROB-slight pelvic pressure

## 2015-06-07 LAB — GC/CHLAMYDIA PROBE AMP
CHLAMYDIA, DNA PROBE: NEGATIVE
Neisseria gonorrhoeae by PCR: NEGATIVE

## 2015-06-08 ENCOUNTER — Other Ambulatory Visit: Payer: Self-pay | Admitting: Obstetrics and Gynecology

## 2015-06-08 DIAGNOSIS — O99820 Streptococcus B carrier state complicating pregnancy: Secondary | ICD-10-CM

## 2015-06-08 LAB — STREP GP B NAA: Strep Gp B NAA: POSITIVE — AB

## 2015-06-13 ENCOUNTER — Encounter: Payer: 59 | Admitting: Certified Nurse Midwife

## 2015-06-13 ENCOUNTER — Ambulatory Visit (INDEPENDENT_AMBULATORY_CARE_PROVIDER_SITE_OTHER): Payer: 59 | Admitting: Certified Nurse Midwife

## 2015-06-13 VITALS — BP 114/72 | HR 90 | Wt 148.5 lb

## 2015-06-13 DIAGNOSIS — R82998 Other abnormal findings in urine: Secondary | ICD-10-CM

## 2015-06-13 DIAGNOSIS — Z331 Pregnant state, incidental: Secondary | ICD-10-CM

## 2015-06-13 DIAGNOSIS — Z369 Encounter for antenatal screening, unspecified: Secondary | ICD-10-CM

## 2015-06-13 DIAGNOSIS — N390 Urinary tract infection, site not specified: Secondary | ICD-10-CM

## 2015-06-13 DIAGNOSIS — Z3490 Encounter for supervision of normal pregnancy, unspecified, unspecified trimester: Secondary | ICD-10-CM

## 2015-06-13 DIAGNOSIS — Z1389 Encounter for screening for other disorder: Secondary | ICD-10-CM

## 2015-06-13 DIAGNOSIS — IMO0001 Reserved for inherently not codable concepts without codable children: Secondary | ICD-10-CM

## 2015-06-13 DIAGNOSIS — Z36 Encounter for antenatal screening of mother: Secondary | ICD-10-CM

## 2015-06-13 LAB — POCT URINALYSIS DIPSTICK
BILIRUBIN UA: NEGATIVE
Blood, UA: NEGATIVE
GLUCOSE UA: NEGATIVE
KETONES UA: NEGATIVE
Nitrite, UA: NEGATIVE
PH UA: 7
Spec Grav, UA: 1.015
Urobilinogen, UA: NEGATIVE

## 2015-06-13 NOTE — Progress Notes (Signed)
ROB-NST reactive.  Denies signs of UTI.  Urine moderate leukocytes sent for culture.  GBS positive results discussed.  [employer] taught.

## 2015-06-15 LAB — URINE CULTURE, OB REFLEX

## 2015-06-15 LAB — CULTURE, OB URINE

## 2015-06-18 ENCOUNTER — Other Ambulatory Visit: Payer: Self-pay | Admitting: Certified Nurse Midwife

## 2015-06-18 ENCOUNTER — Encounter: Payer: Self-pay | Admitting: Certified Nurse Midwife

## 2015-06-18 DIAGNOSIS — O9989 Other specified diseases and conditions complicating pregnancy, childbirth and the puerperium: Principal | ICD-10-CM

## 2015-06-18 DIAGNOSIS — O99891 Other specified diseases and conditions complicating pregnancy: Secondary | ICD-10-CM | POA: Insufficient documentation

## 2015-06-18 DIAGNOSIS — R8271 Bacteriuria: Secondary | ICD-10-CM | POA: Insufficient documentation

## 2015-06-18 MED ORDER — NITROFURANTOIN MONOHYD MACRO 100 MG PO CAPS: 100.0000 mg | ORAL_CAPSULE | Freq: Two times a day (BID) | ORAL | Status: DC

## 2015-06-20 ENCOUNTER — Ambulatory Visit (INDEPENDENT_AMBULATORY_CARE_PROVIDER_SITE_OTHER): Payer: 59

## 2015-06-20 ENCOUNTER — Encounter: Payer: Self-pay | Admitting: Obstetrics and Gynecology

## 2015-06-20 ENCOUNTER — Ambulatory Visit (INDEPENDENT_AMBULATORY_CARE_PROVIDER_SITE_OTHER): Payer: 59 | Admitting: Obstetrics and Gynecology

## 2015-06-20 VITALS — BP 135/78 | HR 83 | Wt 149.2 lb

## 2015-06-20 DIAGNOSIS — Z3493 Encounter for supervision of normal pregnancy, unspecified, third trimester: Secondary | ICD-10-CM

## 2015-06-20 LAB — POCT URINALYSIS DIPSTICK
Bilirubin, UA: NEGATIVE
Glucose, UA: NEGATIVE
Ketones, UA: NEGATIVE
Leukocytes, UA: NEGATIVE
Nitrite, UA: NEGATIVE
PH UA: 6
PROTEIN UA: NEGATIVE
RBC UA: NEGATIVE
SPEC GRAV UA: 1.015
UROBILINOGEN UA: 0.2

## 2015-06-20 NOTE — Progress Notes (Signed)
Indications: Growth/AFI Findings:  Singleton intrauterine pregnancy is visualized with FHR at 127 BPM. Biometrics give an (U/S) Gestational age of [redacted] weeks and 2 days an (U/S) EDD of 07/02/15; this correlates with the clinically established EDD of 07/03/15.  Fetal presentation is vertex, spine left lateral.  EFW: 3483 grams ( 7 lbs. 11 oz. ) Placenta: Anterior, grade 2 AFI: 9.7 cm  Anatomic survey of the lateral ventricles, stomach, bladder and 4 chamber heart are seen and appear WNL. The right and left renal pelves are seen today and are WNL. Right renal pelvis= 6.0 mm and left = 4.8 mm ( > 7 mm is considered abnormal).    Impression: 1. 38 week and 2 day Viable Singleton Intrauterine pregnancy by U/S. 2. (U/S) EDD is consistent with Clinically established (LMP) EDD of 07/03/15. 3. Bilateral kidneys appear WNL. 4. EFW: 3483 grams ( 7 lbs. 11 oz). 64th%.  AFI= 9.7 cm.   Plan IOL next week, discussed method of induction and hopeful for SVD.

## 2015-06-20 NOTE — Progress Notes (Signed)
ROB- denies any new complaints 

## 2015-06-24 ENCOUNTER — Inpatient Hospital Stay: Payer: 59 | Admitting: Anesthesiology

## 2015-06-24 ENCOUNTER — Inpatient Hospital Stay
Admission: EM | Admit: 2015-06-24 | Discharge: 2015-06-26 | DRG: 775 | Disposition: A | Discharge status: Home / Self Care | Payer: 59 | Attending: Obstetrics and Gynecology | Admitting: Obstetrics and Gynecology

## 2015-06-24 ENCOUNTER — Encounter: Payer: Self-pay | Admitting: *Deleted

## 2015-06-24 DIAGNOSIS — O09899 Supervision of other high risk pregnancies, unspecified trimester: Secondary | ICD-10-CM | POA: Diagnosis present

## 2015-06-24 DIAGNOSIS — O99820 Streptococcus B carrier state complicating pregnancy: Secondary | ICD-10-CM

## 2015-06-24 DIAGNOSIS — Z3A38 38 weeks gestation of pregnancy: Secondary | ICD-10-CM

## 2015-06-24 DIAGNOSIS — Z3403 Encounter for supervision of normal first pregnancy, third trimester: Secondary | ICD-10-CM

## 2015-06-24 DIAGNOSIS — Z3480 Encounter for supervision of other normal pregnancy, unspecified trimester: Secondary | ICD-10-CM

## 2015-06-24 DIAGNOSIS — O42913 Preterm premature rupture of membranes, unspecified as to length of time between rupture and onset of labor, third trimester: Secondary | ICD-10-CM | POA: Diagnosis present

## 2015-06-24 DIAGNOSIS — Z3490 Encounter for supervision of normal pregnancy, unspecified, unspecified trimester: Secondary | ICD-10-CM

## 2015-06-24 LAB — CBC
HCT: 33.7 % — ABNORMAL LOW (ref 35.0–47.0)
Hemoglobin: 11.6 g/dL — ABNORMAL LOW (ref 12.0–16.0)
MCH: 29.7 pg (ref 26.0–34.0)
MCHC: 34.4 g/dL (ref 32.0–36.0)
MCV: 86.2 fL (ref 80.0–100.0)
PLATELETS: 140 10*3/uL — AB (ref 150–440)
RBC: 3.91 MIL/uL (ref 3.80–5.20)
RDW: 13 % (ref 11.5–14.5)
WBC: 11 10*3/uL (ref 3.6–11.0)

## 2015-06-24 LAB — ABO/RH: ABO/RH(D): A POS

## 2015-06-24 LAB — TYPE AND SCREEN
ABO/RH(D): A POS
ANTIBODY SCREEN: NEGATIVE

## 2015-06-24 MED ORDER — OXYTOCIN 40 UNITS IN LACTATED RINGERS INFUSION - SIMPLE MED: 62.5000 mL/h | INTRAVENOUS | Status: DC

## 2015-06-24 MED ORDER — OXYCODONE-ACETAMINOPHEN 5-325 MG PO TABS: 1.0000 | ORAL_TABLET | ORAL | Status: DC | PRN

## 2015-06-24 MED ORDER — AMMONIA AROMATIC IN INHA
RESPIRATORY_TRACT | Status: AC
  Filled 2015-06-24: qty 10

## 2015-06-24 MED ORDER — ONDANSETRON HCL 4 MG/2ML IJ SOLN
4.0000 mg | Freq: Four times a day (QID) | INTRAMUSCULAR | Status: DC | PRN
  Administered 2015-06-24 (×2): 4 mg via INTRAVENOUS
  Filled 2015-06-24 (×2): qty 2

## 2015-06-24 MED ORDER — PENICILLIN G POTASSIUM 5000000 UNITS IJ SOLR
5.0000 10*6.[IU] | Freq: Once | INTRAMUSCULAR | Status: AC
  Administered 2015-06-24: 5 10*6.[IU] via INTRAVENOUS
  Filled 2015-06-24: qty 5

## 2015-06-24 MED ORDER — ACETAMINOPHEN 325 MG PO TABS: 650.0000 mg | ORAL_TABLET | ORAL | Status: DC | PRN

## 2015-06-24 MED ORDER — FERROUS SULFATE 325 (65 FE) MG PO TABS
325.0000 mg | ORAL_TABLET | Freq: Two times a day (BID) | ORAL | Status: DC
  Administered 2015-06-25 (×2): 325 mg via ORAL
  Filled 2015-06-24 (×2): qty 1

## 2015-06-24 MED ORDER — OXYTOCIN 10 UNIT/ML IJ SOLN
INTRAMUSCULAR | Status: AC
  Filled 2015-06-24: qty 2

## 2015-06-24 MED ORDER — LACTATED RINGERS IV SOLN
INTRAVENOUS | Status: DC
  Administered 2015-06-24 (×2): via INTRAVENOUS

## 2015-06-24 MED ORDER — PENICILLIN G POTASSIUM 5000000 UNITS IJ SOLR
2.5000 10*6.[IU] | INTRAVENOUS | Status: DC
  Administered 2015-06-24 (×2): 2.5 10*6.[IU] via INTRAVENOUS
  Filled 2015-06-24 (×7): qty 2.5

## 2015-06-24 MED ORDER — OXYTOCIN 40 UNITS IN LACTATED RINGERS INFUSION - SIMPLE MED
INTRAVENOUS | Status: AC
  Filled 2015-06-24: qty 1000

## 2015-06-24 MED ORDER — LIDOCAINE HCL (PF) 1 % IJ SOLN: 30.0000 mL | INTRAMUSCULAR | Status: DC | PRN

## 2015-06-24 MED ORDER — OXYCODONE-ACETAMINOPHEN 5-325 MG PO TABS: 2.0000 | ORAL_TABLET | ORAL | Status: DC | PRN

## 2015-06-24 MED ORDER — LIDOCAINE HCL (PF) 1 % IJ SOLN
INTRAMUSCULAR | Status: AC
  Administered 2015-06-24: 2 mL
  Filled 2015-06-24: qty 30

## 2015-06-24 MED ORDER — EPHEDRINE 5 MG/ML INJ: 10.0000 mg | INTRAVENOUS | Status: DC | PRN

## 2015-06-24 MED ORDER — DIPHENHYDRAMINE HCL 50 MG/ML IJ SOLN
12.5000 mg | INTRAMUSCULAR | Status: DC | PRN
Start: 2015-06-24 — End: 2015-06-24

## 2015-06-24 MED ORDER — OXYTOCIN BOLUS FROM INFUSION: 500.0000 mL | INTRAVENOUS | Status: DC

## 2015-06-24 MED ORDER — MISOPROSTOL 200 MCG PO TABS
ORAL_TABLET | ORAL | Status: AC
  Filled 2015-06-24: qty 4

## 2015-06-24 MED ORDER — PHENYLEPHRINE 40 MCG/ML (10ML) SYRINGE FOR IV PUSH (FOR BLOOD PRESSURE SUPPORT)
80.0000 ug | PREFILLED_SYRINGE | INTRAVENOUS | Status: DC | PRN
Start: 2015-06-24 — End: 2015-06-24

## 2015-06-24 MED ORDER — DOCUSATE SODIUM 100 MG PO CAPS
100.0000 mg | ORAL_CAPSULE | Freq: Two times a day (BID) | ORAL | Status: DC
  Administered 2015-06-24 – 2015-06-25 (×3): 100 mg via ORAL
  Filled 2015-06-24 (×3): qty 1

## 2015-06-24 MED ORDER — MEASLES, MUMPS & RUBELLA VAC ~~LOC~~ INJ
0.5000 mL | INJECTION | Freq: Once | SUBCUTANEOUS | Status: DC
Start: 2015-06-25 — End: 2015-06-25
  Filled 2015-06-24: qty 0.5

## 2015-06-24 MED ORDER — IBUPROFEN 800 MG PO TABS
800.0000 mg | ORAL_TABLET | Freq: Three times a day (TID) | ORAL | Status: DC
  Administered 2015-06-24 – 2015-06-25 (×3): 800 mg via ORAL
  Filled 2015-06-24 (×3): qty 1

## 2015-06-24 MED ORDER — TETANUS-DIPHTH-ACELL PERTUSSIS 5-2.5-18.5 LF-MCG/0.5 IM SUSP: 0.5000 mL | INTRAMUSCULAR | Status: DC | PRN

## 2015-06-24 MED ORDER — LACTATED RINGERS IV SOLN: 500.0000 mL | INTRAVENOUS | Status: DC | PRN

## 2015-06-24 MED ORDER — BENZOCAINE-MENTHOL 20-0.5 % EX AERO
1.0000 "application " | INHALATION_SPRAY | CUTANEOUS | Status: DC | PRN
  Administered 2015-06-26: 1 via TOPICAL
  Filled 2015-06-24 (×2): qty 56

## 2015-06-24 MED ORDER — PRENATAL MULTIVITAMIN CH
1.0000 | ORAL_TABLET | Freq: Every day | ORAL | Status: DC
  Administered 2015-06-25: 1 via ORAL
  Filled 2015-06-24: qty 1

## 2015-06-24 MED ORDER — ONDANSETRON HCL 4 MG PO TABS: 4.0000 mg | ORAL_TABLET | ORAL | Status: DC | PRN

## 2015-06-24 MED ORDER — TERBUTALINE SULFATE 1 MG/ML IJ SOLN: 0.2500 mg | Freq: Once | INTRAMUSCULAR | Status: DC | PRN

## 2015-06-24 MED ORDER — BUPIVACAINE HCL (PF) 0.25 % IJ SOLN
INTRAMUSCULAR | Status: DC | PRN
  Administered 2015-06-24: 5 mL via EPIDURAL

## 2015-06-24 MED ORDER — LIDOCAINE-EPINEPHRINE (PF) 1.5 %-1:200000 IJ SOLN
INTRAMUSCULAR | Status: DC | PRN
  Administered 2015-06-24: 2 mL via PERINEURAL

## 2015-06-24 MED ORDER — CITRIC ACID-SODIUM CITRATE 334-500 MG/5ML PO SOLN: 30.0000 mL | ORAL | Status: DC | PRN

## 2015-06-24 MED ORDER — DIPHENHYDRAMINE HCL 25 MG PO CAPS: 25.0000 mg | ORAL_CAPSULE | Freq: Four times a day (QID) | ORAL | Status: DC | PRN

## 2015-06-24 MED ORDER — FENTANYL 2.5 MCG/ML W/ROPIVACAINE 0.2% IN NS 100 ML EPIDURAL INFUSION (ARMC-ANES)
EPIDURAL | Status: AC
  Administered 2015-06-24: 2 mL/h via EPIDURAL
  Filled 2015-06-24: qty 100

## 2015-06-24 MED ORDER — SIMETHICONE 80 MG PO CHEW: 80.0000 mg | CHEWABLE_TABLET | ORAL | Status: DC | PRN

## 2015-06-24 MED ORDER — OXYTOCIN 40 UNITS IN LACTATED RINGERS INFUSION - SIMPLE MED
1.0000 m[IU]/min | INTRAVENOUS | Status: DC
  Administered 2015-06-24: 2 m[IU]/min via INTRAVENOUS
  Administered 2015-06-24: 1 m[IU]/min via INTRAVENOUS
  Administered 2015-06-24: 4 m[IU]/min via INTRAVENOUS

## 2015-06-24 MED ORDER — LANOLIN HYDROUS EX OINT: TOPICAL_OINTMENT | CUTANEOUS | Status: DC | PRN

## 2015-06-24 MED ORDER — FENTANYL 2.5 MCG/ML W/ROPIVACAINE 0.2% IN NS 100 ML EPIDURAL INFUSION (ARMC-ANES)
12.0000 mL/h | EPIDURAL | Status: DC
  Administered 2015-06-24: 2 mL/h via EPIDURAL

## 2015-06-24 MED ORDER — ONDANSETRON HCL 4 MG/2ML IJ SOLN: 4.0000 mg | INTRAMUSCULAR | Status: DC | PRN

## 2015-06-24 NOTE — Progress Notes (Signed)
Janet Thompson is a 24 y.o. G1P0000 at [redacted]w[redacted]d by ultrasound admitted for rupture of membranes.  Patient feeling rectal pressure.  Subjective:   Objective: BP 120/82 mmHg  Pulse 105  Temp(Src) 98.1 F (36.7 C) (Oral)  Resp 16  Ht 5\' 10"  (1.778 m)  Wt 67.586 kg (149 lb)  BMI 21.38 kg/m2  SpO2 99%  LMP 09/26/2014 (LMP Unknown)      FHT:  FHR: 140 bpm, variability: moderate,  accelerations:  Present,  decelerations:  Absent UC:   regular, every 1-3 minutes on 6 mu/min pitocin SVE:   Dilation: 10 Effacement (%): 100 Station: 0 Exam by:: Dougles Kimmey, CNM  Labs: Lab Results  Component Value Date   WBC 11.0 06/24/2015   HGB 11.6* 06/24/2015   HCT 33.7* 06/24/2015   MCV 86.2 06/24/2015   PLT 140* 06/24/2015    Assessment / Plan: Augmentation of labor, progressing well  Labor: Progressing normally Preeclampsia:  n/a Fetal Wellbeing:  Category I Pain Control:  Epidural I/D:  n/a Anticipated MOD:  NSVD  Brennan Bailey, Certified Nurse Midwife 06/24/2015, 4:53 PM

## 2015-06-24 NOTE — Anesthesia Preprocedure Evaluation (Signed)
Anesthesia Evaluation  Patient identified by MRN, date of birth, ID band Patient awake    Reviewed: Allergy & Precautions, NPO status , Patient's Chart, lab work & pertinent test results  History of Anesthesia Complications Negative for: history of anesthetic complications  Airway Mallampati: I       Dental no notable dental hx. (+) Teeth Intact   Pulmonary neg pulmonary ROS,    breath sounds clear to auscultation       Cardiovascular negative cardio ROS   Rhythm:Regular Rate:Normal     Neuro/Psych    GI/Hepatic negative GI ROS, Neg liver ROS,   Endo/Other  negative endocrine ROS  Renal/GU negative Renal ROS     Musculoskeletal negative musculoskeletal ROS (+)   Abdominal Normal abdominal exam  (+)   Peds negative pediatric ROS (+)  Hematology negative hematology ROS (+)   Anesthesia Other Findings   Reproductive/Obstetrics                             Anesthesia Physical Anesthesia Plan  ASA: II  Anesthesia Plan: Epidural   Post-op Pain Management:    Induction:   Airway Management Planned:   Additional Equipment:   Intra-op Plan:   Post-operative Plan:   Informed Consent: I have reviewed the patients History and Physical, chart, labs and discussed the procedure including the risks, benefits and alternatives for the proposed anesthesia with the patient or authorized representative who has indicated his/her understanding and acceptance.     Plan Discussed with:   Anesthesia Plan Comments:         Anesthesia Quick Evaluation

## 2015-06-24 NOTE — Progress Notes (Signed)
History & Physical  Subjective/Objective 24 year old G1 P0 who had spontaneous rupture of membranes at 0559 clear fluid without contractions until 30 minutes after SROM.  Reports good fetal movement.  Plan epidural for pain management.  Scheduled Meds: . ammonia      . lidocaine (PF)      . misoprostol      . oxytocin      . oxytocin 40 units in LR 1000 mL      . pencillin G potassium IV  2.5 Million Units Intravenous 6 times per day   Continuous Infusions: . lactated ringers 125 mL/hr at 06/24/15 0840  . oxytocin 40 units in LR 1000 mL    . [START ON 06/27/2015] oxytocin 40 units in LR 1000 mL    . oxytocin 40 units in LR 1000 mL     PRN Meds:acetaminophen, citric acid-sodium citrate, lactated ringers, lidocaine (PF), ondansetron, terbutaline  Vital signs in last 24 hours: Temp:  [98.2 F (36.8 C)-98.4 F (36.9 C)] 98.2 F (36.8 C) (12/25 1001) Pulse Rate:  [98-114] 114 (12/25 1104) Resp:  [16] 16 (12/25 1104) BP: (117-139)/(81-84) 126/84 mmHg (12/25 1104) Weight:  [67.586 kg (149 lb)] 67.586 kg (149 lb) (12/25 0738)  Intake/Output last 3 shifts:   Intake/Output this shift:    Problem Assessment/Plan Other Single umbilical artery, maternal, antepartum Assessment & Plan New born nursery aware  GBS (group B Streptococcus carrier), +RV culture, currently pregnant Assessment & Plan PCN every 4 hours  Pregnancy Overview Anticipate NSVD, Dr Tennis Must aware of patient and status Limit sve  NEW OB HISTORY AND PHYSICAL  SUBJECTIVE:       Janet Thompson is a 24 y.o. G53P0000 female, Patient's last menstrual period was 09/26/2014 (lmp unknown).    Gynecologic History Patient's last menstrual period was 09/26/2014 (lmp unknown). Abnormal  Obstetric History OB History  Gravida Para Term Preterm AB SAB TAB Ectopic Multiple Living  1 0 0 0 0 0 0 0 0 0     # Outcome Date GA Lbr Len/2nd Weight Sex Delivery Anes PTL Lv  1 Current               Past Medical History   Diagnosis Date  . Menstrual irregularity   . Medical history non-contributory     Past Surgical History  Procedure Laterality Date  . Wisdom tooth extraction      No current facility-administered medications on file prior to encounter.   Current Outpatient Prescriptions on File Prior to Encounter  Medication Sig Dispense Refill  . nitrofurantoin, macrocrystal-monohydrate, (MACROBID) 100 MG capsule Take 1 capsule (100 mg total) by mouth 2 (two) times daily. 14 capsule 0  . Prenatal Vit-Fe Fumarate-FA (PRENATAL MULTIVITAMIN) TABS tablet Take 1 tablet by mouth daily at 12 noon.    . ranitidine (ZANTAC) 150 MG tablet Take 1 tablet (150 mg total) by mouth 2 (two) times daily. 60 tablet 2  . [DISCONTINUED] Drospirenone-Ethinyl Estradiol-Levomefol (BEYAZ) 3-0.02-0.451 MG tablet Take 1 tablet by mouth daily.      No Known Allergies  Social History   Social History  . Marital Status: Single    Spouse Name: N/A  . Number of Children: N/A  . Years of Education: N/A   Occupational History  . Not on file.   Social History Main Topics  . Smoking status: Never Smoker   . Smokeless tobacco: Never Used  . Alcohol Use: No  . Drug Use: No  . Sexual Activity: Yes    Birth  Control/ Protection: None   Other Topics Concern  . Not on file   Social History Narrative    Family History  Problem Relation Age of Onset  . Cancer Maternal Aunt   . Alcohol abuse Maternal Grandfather     colon    The following portions of the patient's history were reviewed and updated as appropriate: allergies, current medications, past OB history, past medical history, past surgical history, past family history, past social history, and problem list.    OBJECTIVE: History & Physical  GENERAL APPEARANCE: alert, well appearing, in no apparent distress HEAD: normocephalic, atraumatic MOUTH: mucous membranes moist, pharynx normal without lesions and dental hygiene good THYROID: no thyromegaly or masses  present BREASTS: no masses noted, no significant tenderness, no palpable axillary nodes, no skin changes, not examined LUNGS: clear to auscultation, no wheezes, rales or rhonchi, symmetric air entry HEART: regular rate and rhythm, no murmurs ABDOMEN: soft, nontender, nondistended, no abnormal masses, no epigastric pain, FHT present and 130 EXTREMITIES: no redness or tenderness in the calves or thighs, no edema SKIN: normal coloration and turgor, no rashes LYMPH NODES: no adenopathy palpable NEUROLOGIC: alert, oriented, normal speech, no focal findings or movement disorder noted  PELVIC EXAM completed by RN will complete when patient more uncomfortable  ASSESSMENT: Premature rupture of membranes at term  PLAN: Anticipate SVD pitocin augmentation  I have reviewed the record and concur with patient management and plan. Yvan Dority, Hassell Done, MD, Cherlynn June

## 2015-06-24 NOTE — Anesthesia Procedure Notes (Signed)
Epidural Patient location during procedure: OB Start time: 06/24/2015 12:20 PM End time: 06/24/2015 12:33 PM  Staffing Anesthesiologist: Marline Backbone F  Preanesthetic Checklist Completed: patient identified, site marked, surgical consent, pre-op evaluation, timeout performed, IV checked, risks and benefits discussed, monitors and equipment checked and at surgeon's request  Epidural Patient position: sitting Prep: Betadine Patient monitoring: heart rate and blood pressure Approach: midline Location: L3-L4 Injection technique: LOR air and LOR saline  Needle:  Needle type: Tuohy  Needle gauge: 18 G Needle length: 9 cm Needle insertion depth: 4 cm Catheter type: closed end flexible Catheter size: 20 Guage Catheter at skin depth: 9 cm Test dose: negative and 1.5% lidocaine with Epi 1:200 K  Assessment Sensory level: T8  Additional Notes Reason for block:at surgeon's request

## 2015-06-24 NOTE — OB Triage Note (Signed)
Pt. Reports spontaneous rupture of membranes @ 0600 this morning, clear fluid. Denies contractions @ this time. Does report menstrual like cramps. Janet Thompson

## 2015-06-24 NOTE — Progress Notes (Signed)
Banelly Rosenburg is a 24 y.o. G1P0000 at [redacted]w[redacted]d by LMP admitted for PROM  Subjective:   Objective: BP 126/84 mmHg  Pulse 114  Temp(Src) 98.2 F (36.8 C) (Oral)  Resp 16  Ht 5\' 10"  (1.778 m)  Wt 67.586 kg (149 lb)  BMI 21.38 kg/m2  LMP 09/26/2014 (LMP Unknown)      FHT:  FHR: 130 bpm, variability: moderate,  accelerations:  Present,  decelerations:  Absent UC:   irregular, every 2-4 minutes SVE:   Dilation: 3 Effacement (%): 80 Station: -2 Exam by:: LSE  Labs: Lab Results  Component Value Date   WBC 11.0 06/24/2015   HGB 11.6* 06/24/2015   HCT 33.7* 06/24/2015   MCV 86.2 06/24/2015   PLT 140* 06/24/2015    Assessment / Plan: Premature rupture of membranes  Labor: Progressing normally Preeclampsia:  not applicable Fetal Wellbeing:  Category I Pain Control:  Labor support without medications I/D:  n/a Anticipated MOD:  NSVD  Brennan Bailey 06/24/2015, 11:17 AM

## 2015-06-24 NOTE — Assessment & Plan Note (Signed)
New born nursery aware

## 2015-06-24 NOTE — Plan of Care (Signed)
Problem: Pain Management: Goal: Relief or control of pain from uterine contractions will improve Outcome: Completed/Met Date Met:  06/24/15 Pt. received epidural for pain management.

## 2015-06-24 NOTE — Progress Notes (Signed)
Janet Thompson is a 24 y.o. G1P0000 at [redacted]w[redacted]d by ultrasound admitted for rupture of membranes  Subjective:   Objective: BP 113/67 mmHg  Pulse 105  Temp(Src) 97.4 F (36.3 C) (Oral)  Resp 16  Ht 5\' 10"  (1.778 m)  Wt 67.586 kg (149 lb)  BMI 21.38 kg/m2  SpO2 99%  LMP 09/26/2014 (LMP Unknown)      FHT:  FHR: 135 bpm, variability: moderate,  accelerations:  Present,  decelerations:  Absent UC:   irregular, every 1-3 minutes on 6 mu/min pitocin SVE:   Dilation: 5.5 Effacement (%): 100 Station: 0 Exam by:: RN  Labs: Lab Results  Component Value Date   WBC 11.0 06/24/2015   HGB 11.6* 06/24/2015   HCT 33.7* 06/24/2015   MCV 86.2 06/24/2015   PLT 140* 06/24/2015    Assessment / Plan: Augmentation of labor, progressing well  Labor: Progressing normally Preeclampsia:  n/a Fetal Wellbeing:  Category I Pain Control:  Epidural I/D:  n/a Anticipated MOD:  NSVD  Brennan Bailey, Certified nurse midwife 06/24/2015, 3:01 PM

## 2015-06-24 NOTE — H&P (Signed)
  See initial Progress note at 1022 this am for details.  Brayton Mars, MD

## 2015-06-24 NOTE — Assessment & Plan Note (Signed)
PCN every 4 hours

## 2015-06-25 LAB — CBC
HCT: 28.3 % — ABNORMAL LOW (ref 35.0–47.0)
Hemoglobin: 9.7 g/dL — ABNORMAL LOW (ref 12.0–16.0)
MCH: 29.5 pg (ref 26.0–34.0)
MCHC: 34.1 g/dL (ref 32.0–36.0)
MCV: 86.4 fL (ref 80.0–100.0)
PLATELETS: 132 10*3/uL — AB (ref 150–440)
RBC: 3.28 MIL/uL — AB (ref 3.80–5.20)
RDW: 13 % (ref 11.5–14.5)
WBC: 12.5 10*3/uL — AB (ref 3.6–11.0)

## 2015-06-25 MED ORDER — WHITE PETROLATUM GEL
Status: AC
  Filled 2015-06-25: qty 5

## 2015-06-25 NOTE — Progress Notes (Signed)
Post Partum Day one Subjective: no complaints, up ad lib, voiding and tolerating PO.  Patient desires OCP for [facility].  Objective: Blood pressure 119/59, pulse 62, temperature 97.7 F (36.5 C), temperature source Oral, resp. rate 17, height 5\' 10"  (1.778 m), weight 67.586 kg (149 lb), last menstrual period 09/26/2014, SpO2 98 %, unknown if currently breastfeeding.  Physical Exam:  General: alert, cooperative and no distress Lochia: appropriate Uterine Fundus: firm at 2 below umbilicus Incision: n/a DVT Evaluation: No evidence of DVT seen on physical exam. Negative Homan's sign. No cords or calf tenderness.   Recent Labs  06/24/15 0844 06/25/15 0544  HGB 11.6* 9.7*  HCT 33.7* 28.3*    Assessment/Plan: Plan for discharge tomorrow   LOS: 1 day   Brennan Bailey 06/25/2015, 6:48 AM

## 2015-06-25 NOTE — Anesthesia Postprocedure Evaluation (Signed)
Anesthesia Post Note  Patient: Janet Thompson  Procedure(s) Performed: * No procedures listed *  Patient location during evaluation: Mother Baby Anesthesia Type: Epidural Level of consciousness: awake, awake and alert and oriented Pain management: pain level controlled Vital Signs Assessment: post-procedure vital signs reviewed and stable Respiratory status: spontaneous breathing and nonlabored ventilation Cardiovascular status: blood pressure returned to baseline and stable Postop Assessment: no headache, no backache and patient able to bend at knees Anesthetic complications: no    Last Vitals:  Filed Vitals:   06/25/15 1135 06/25/15 1607  BP: 107/63 100/61  Pulse: 89 86  Temp: 36.6 C 36.6 C  Resp:  18    Last Pain:  Filed Vitals:   06/25/15 1607  PainSc: 0-No pain                 Tyner Codner,  Ambers Iyengar R

## 2015-06-26 ENCOUNTER — Encounter: Payer: Self-pay | Admitting: Obstetrics and Gynecology

## 2015-06-26 LAB — RPR: RPR: NONREACTIVE

## 2015-06-26 MED ORDER — NORETHINDRONE 0.35 MG PO TABS: 1.0000 | ORAL_TABLET | Freq: Every day | ORAL | Status: DC

## 2015-06-26 NOTE — Progress Notes (Signed)
Patient understands all discharge instructions and the need to make follow up appointments. Patient discharge via wheelchair with auxillary. 

## 2015-06-26 NOTE — Discharge Instructions (Signed)
Call your midwife for increased pain.  Vaginal bleeding soaking more than a pad per hour, temperature above 100.4, depression, or concerns.  No strenuous activity or heavy lifting for 6 weeks.  No intercourse, tampons, douching, or enemas for 6 weeks.  No driving for 2 weeks if taking pain medications.  Bleeding: Your bleeding could continue up to 6 weeks, the flow should gradually decrease and the color should become dark then lightened over the next couple of weeks. If you notice you are bleeding heavily or passing clots larger than the size of your fist, PLEASE call your physician. No TAMPONS, DOUCHING, ENEMAS OR SEXUAL INTERCOURSE for 6 weeks.   Stitches: Shower daily with mild soap and water. Stitches will dissolve over the next couple of weeks, if you experience any discomfort in the vaginal area you may sit in warm water 15-20 minutes, 3-4 times per day. Just enough water to cover vaginal area.   AfterPains: This is the uterus contracting back to its normal position and size. Use medications prescribed or recommended by your physician to help relieve this discomfort.   Bowels/Hemorrhoids: Drink plenty of water and stay active. Increase fiber, fresh fruits and vegetables in your diet.   Rest/Activity: Rest when the baby is resting  Bathing: Shower daily!  Diet: Continue to eat extra calories until your follow up visit to help replenish nutrients and vitamins. If breastfeeding eat an extra [phone] and increase your fluid intake to 12 glasses a day.   Contraception: Consult with your physician on what method of birth control you would like to use.   Postpartum "BLUES": It is common to emotional days after delivery, however if it persist for greater than 2 weeks or if you feel concerned please let your physician know immediately. This is hormone driven and nothing you can control so please let someone know how you feel.  Follow Up Visit: Please schedule a follow up visit with your physician.

## 2015-06-26 NOTE — Discharge Summary (Signed)
Obstetric Discharge Summary Reason for Admission: onset of labor Prenatal Procedures: none Intrapartum Procedures: spontaneous vaginal delivery and GBS prophylaxis Postpartum Procedures: none Complications-Operative and Postpartum: 1st degree perineal laceration HEMOGLOBIN  Date Value Ref Range Status  06/25/2015 9.7* 12.0 - 16.0 g/dL Final   HCT  Date Value Ref Range Status  06/25/2015 28.3* 35.0 - 47.0 % Final   HEMATOCRIT  Date Value Ref Range Status  04/11/2015 33.4* 34.0 - 46.6 % Final    Physical Exam:  General: alert, cooperative and appears stated age 24: appropriate Uterine Fundus: soft Incision: n/a DVT Evaluation: No evidence of DVT seen on physical exam. Negative Homan's sign.  Discharge Diagnoses: Term Pregnancy-delivered  Discharge Information: Date: 06/26/2015 Activity: pelvic rest Diet: routine Medications: PNV, Ibuprofen and Nor QD Condition: stable Instructions: refer to practice specific booklet Discharge to: home Follow-up Information    Follow up with Brennan Bailey, CNM. Schedule an appointment as soon as possible for a visit in 6 weeks.   Specialty:  Certified Nurse Midwife   Contact information:   [place] [id number] [phone]       Newborn Data: Live born female  Birth Weight: 8 lb 4.6 oz (3760 g) APGAR: 8, 9  Home with mother.  Brennan Bailey, Certified Nurse Midwife 06/26/2015, 12:35 PM

## 2015-07-23 MED FILL — NORETHINDRONE 0.35 MG TAB: 0.35 | 28 days supply | Qty: 28 | Fill #1

## 2015-08-08 ENCOUNTER — Ambulatory Visit: Payer: 59 | Admitting: Obstetrics and Gynecology

## 2015-08-08 ENCOUNTER — Ambulatory Visit (INDEPENDENT_AMBULATORY_CARE_PROVIDER_SITE_OTHER): Payer: 59 | Admitting: Obstetrics and Gynecology

## 2015-08-08 ENCOUNTER — Encounter: Payer: Self-pay | Admitting: Obstetrics and Gynecology

## 2015-08-08 NOTE — Progress Notes (Signed)
  Subjective:     Janet Thompson is a 25 y.o. female who presents for a postpartum visit. She is 6 weeks postpartum following a spontaneous vaginal delivery. I have fully reviewed the prenatal and intrapartum course. The delivery was at 12 gestational weeks. Outcome: spontaneous vaginal delivery. Anesthesia: epidural. Postpartum course has been uncomplicated. Baby's course has been uncomplicated. Baby is feeding by breast. Bleeding thin lochia. Bowel function is normal. Bladder function is normal. Patient is not sexually active. Contraception method is abstinence. Postpartum depression screening: negative.  The following portions of the patient's history were reviewed and updated as appropriate: allergies, current medications, past family history, past medical history, past social history, past surgical history and problem list.  Review of Systems A comprehensive review of systems was negative.   Objective:    BP 120/83 mmHg  Pulse 93  Ht 5\' 10"  (1.778 m)  Wt 123 lb 3.2 oz (55.883 kg)  BMI 17.68 kg/m2  Breastfeeding? Yes  General:  alert, cooperative and appears stated age   Breasts:  inspection negative, no nipple discharge or bleeding, no masses or nodularity palpable  Lungs: clear to auscultation bilaterally  Heart:  regular rate and rhythm, S1, S2 normal, no murmur, click, rub or gallop  Abdomen: soft, non-tender; bowel sounds normal; no masses,  no organomegaly   Vulva:  normal  Vagina: normal vagina, no discharge, exudate, lesion, or erythema  Cervix:  multiparous appearance  Corpus: normal size, contour, position, consistency, mobility, non-tender  Adnexa:  normal adnexa and no mass, fullness, tenderness  Rectal Exam: Not performed.        Assessment:     6 weeks postpartum exam. Pap smear not done at today's visit.   Plan:    1. Contraception: oral progesterone-only contraceptive 2. Labs for anemia obtained 3. Follow up in: 4 months for AE or as needed.

## 2015-08-08 NOTE — Patient Instructions (Signed)
  Place postpartum visit patient instructions here.  

## 2015-08-09 LAB — VITAMIN D 25 HYDROXY (VIT D DEFICIENCY, FRACTURES): VIT D 25 HYDROXY: 36.4 ng/mL (ref 30.0–100.0)

## 2015-08-09 LAB — IRON: Iron: 81 ug/dL (ref 27–159)

## 2015-08-09 LAB — CBC
HEMATOCRIT: 38.2 % (ref 34.0–46.6)
HEMOGLOBIN: 12.6 g/dL (ref 11.1–15.9)
MCH: 28.2 pg (ref 26.6–33.0)
MCHC: 33 g/dL (ref 31.5–35.7)
MCV: 86 fL (ref 79–97)
Platelets: 218 10*3/uL (ref 150–379)
RBC: 4.47 x10E6/uL (ref 3.77–5.28)
RDW: 14 % (ref 12.3–15.4)
WBC: 6.3 10*3/uL (ref 3.4–10.8)

## 2015-08-24 MED FILL — NORETHINDRONE 0.35 MG TAB: 0.35 | 84 days supply | Qty: 84 | Fill #2

## 2015-11-06 MED FILL — NORETHINDRONE 0.35 MG TAB: 0.35 | 84 days supply | Qty: 84 | Fill #3

## 2015-12-13 ENCOUNTER — Encounter: Payer: 59 | Admitting: Obstetrics and Gynecology

## 2015-12-21 ENCOUNTER — Encounter: Payer: Self-pay | Admitting: Obstetrics and Gynecology

## 2015-12-21 ENCOUNTER — Other Ambulatory Visit: Payer: Self-pay | Admitting: Obstetrics and Gynecology

## 2015-12-21 ENCOUNTER — Ambulatory Visit (INDEPENDENT_AMBULATORY_CARE_PROVIDER_SITE_OTHER): Payer: 59 | Admitting: Obstetrics and Gynecology

## 2015-12-21 VITALS — BP 133/82 | HR 90 | Ht 70.0 in | Wt 132.6 lb

## 2015-12-21 DIAGNOSIS — Z01419 Encounter for gynecological examination (general) (routine) without abnormal findings: Secondary | ICD-10-CM | POA: Diagnosis not present

## 2015-12-21 NOTE — Patient Instructions (Signed)
Place annual gynecologic exam patient instructions here.

## 2015-12-21 NOTE — Progress Notes (Signed)
  Subjective:     Janet Thompson is a 25 y.o. female and is here for a comprehensive physical exam. The patient reports problems - headaches daily with OCPs.  Social History   Social History  . Marital Status: Single    Spouse Name: N/A  . Number of Children: N/A  . Years of Education: N/A   Occupational History  . Not on file.   Social History Main Topics  . Smoking status: Never Smoker   . Smokeless tobacco: Never Used  . Alcohol Use: No  . Drug Use: No  . Sexual Activity: Yes    Birth Control/ Protection: None   Other Topics Concern  . Not on file   Social History Narrative   Health Maintenance  Topic Date Due  . PAP SMEAR  09/02/2009  . INFLUENZA VACCINE  01/29/2016  . TETANUS/TDAP  04/10/2025  . HIV Screening  Completed    The following portions of the patient's history were reviewed and updated as appropriate: allergies, current medications, past family history, past medical history, past social history, past surgical history and problem list.  Review of Systems Pertinent items noted in HPI and remainder of comprehensive ROS otherwise negative.   Objective:    General appearance: alert, cooperative and appears stated age Neck: no adenopathy, no carotid bruit, no JVD, supple, symmetrical, trachea midline and thyroid not enlarged, symmetric, no tenderness/mass/nodules Lungs: clear to auscultation bilaterally Breasts: normal appearance, no masses or tenderness Heart: regular rate and rhythm, S1, S2 normal, no murmur, click, rub or gallop Abdomen: soft, non-tender; bowel sounds normal; no masses,  no organomegaly Pelvic: cervix normal in appearance, external genitalia normal, no adnexal masses or tenderness, no cervical motion tenderness, rectovaginal septum normal, uterus normal size, shape, and consistency and vagina normal without discharge    Assessment:    Healthy female exam. Contraception counseling      Plan:  Considering Paragard- info given to  review.   See After Visit Summary for Counseling Recommendations

## 2015-12-25 LAB — CYTOLOGY - PAP

## 2015-12-27 ENCOUNTER — Ambulatory Visit: Payer: 59 | Admitting: Obstetrics and Gynecology

## 2015-12-27 ENCOUNTER — Encounter: Payer: Self-pay | Admitting: Obstetrics and Gynecology

## 2015-12-27 VITALS — BP 118/74 | HR 86 | Wt 131.1 lb

## 2015-12-27 DIAGNOSIS — Z30430 Encounter for insertion of intrauterine contraceptive device: Secondary | ICD-10-CM

## 2016-03-04 ENCOUNTER — Telehealth: Payer: Self-pay | Admitting: Obstetrics and Gynecology

## 2016-03-04 NOTE — Telephone Encounter (Signed)
PT CALLED AND I TALKED TO MELODY ABOUT THIS AND SHE SAID TO TAKE A MESSAGE AND HAVE YOU CALL HER BACK TO TRIAGE, PT CALLED AND IT WANTING IUD CHECKED SHE IS HAVING LOWER ABDOMINAL PAIN./ CRAMPING.

## 2016-03-13 NOTE — Telephone Encounter (Signed)
Left Consuella a message to contact our office if she needed to be seen

## 2016-04-16 ENCOUNTER — Other Ambulatory Visit (INDEPENDENT_AMBULATORY_CARE_PROVIDER_SITE_OTHER): Payer: 59

## 2016-04-16 ENCOUNTER — Ambulatory Visit (INDEPENDENT_AMBULATORY_CARE_PROVIDER_SITE_OTHER): Payer: 59 | Admitting: Nurse Practitioner

## 2016-04-16 ENCOUNTER — Encounter: Payer: Self-pay | Admitting: Nurse Practitioner

## 2016-04-16 VITALS — BP 118/80 | HR 88 | Temp 97.7°F | Ht 70.0 in | Wt 127.0 lb

## 2016-04-16 DIAGNOSIS — R590 Localized enlarged lymph nodes: Secondary | ICD-10-CM | POA: Diagnosis not present

## 2016-04-16 DIAGNOSIS — D649 Anemia, unspecified: Secondary | ICD-10-CM

## 2016-04-16 DIAGNOSIS — N644 Mastodynia: Secondary | ICD-10-CM | POA: Diagnosis not present

## 2016-04-16 DIAGNOSIS — Z0001 Encounter for general adult medical examination with abnormal findings: Secondary | ICD-10-CM

## 2016-04-16 LAB — COMPREHENSIVE METABOLIC PANEL
ALBUMIN: 4.8 g/dL (ref 3.5–5.2)
ALT: 16 U/L (ref 0–35)
AST: 16 U/L (ref 0–37)
Alkaline Phosphatase: 68 U/L (ref 39–117)
BILIRUBIN TOTAL: 0.8 mg/dL (ref 0.2–1.2)
BUN: 12 mg/dL (ref 6–23)
CALCIUM: 9.6 mg/dL (ref 8.4–10.5)
CO2: 29 mEq/L (ref 19–32)
CREATININE: 0.66 mg/dL (ref 0.40–1.20)
Chloride: 107 mEq/L (ref 96–112)
GFR: 113.66 mL/min (ref >60.00)
Glucose, Bld: 91 mg/dL (ref 70–99)
Potassium: 4.5 mEq/L (ref 3.5–5.1)
Sodium: 142 mEq/L (ref 135–145)
Total Protein: 7.3 g/dL (ref 6.0–8.3)

## 2016-04-16 LAB — LIPID PANEL
CHOLESTEROL: 131 mg/dL (ref 0–200)
HDL: 56.9 mg/dL (ref >39.00)
LDL CALC: 62 mg/dL (ref 0–99)
NONHDL: 74.17
Total CHOL/HDL Ratio: 2
Triglycerides: 62 mg/dL (ref 0.0–149.0)
VLDL: 12.4 mg/dL (ref 0.0–40.0)

## 2016-04-16 LAB — CBC WITH DIFFERENTIAL/PLATELET
BASOS ABS: 0 10*3/uL (ref 0.0–0.1)
Basophils Relative: 0.9 % (ref 0.0–3.0)
EOS ABS: 0.1 10*3/uL (ref 0.0–0.7)
Eosinophils Relative: 1.7 % (ref 0.0–5.0)
HEMATOCRIT: 36.4 % (ref 36.0–46.0)
HEMOGLOBIN: 12.4 g/dL (ref 12.0–15.0)
LYMPHS PCT: 36.1 % (ref 12.0–46.0)
Lymphs Abs: 1.9 10*3/uL (ref 0.7–4.0)
MCHC: 34.1 g/dL (ref 30.0–36.0)
MCV: 87.3 fl (ref 78.0–100.0)
Monocytes Absolute: 0.4 10*3/uL (ref 0.1–1.0)
Monocytes Relative: 6.8 % (ref 3.0–12.0)
Neutro Abs: 2.9 10*3/uL (ref 1.4–7.7)
Neutrophils Relative %: 54.5 % (ref 43.0–77.0)
Platelets: 225 10*3/uL (ref 150.0–400.0)
RBC: 4.17 Mil/uL (ref 3.87–5.11)
RDW: 13.3 % (ref 11.5–15.5)
WBC: 5.3 10*3/uL (ref 4.0–10.5)

## 2016-04-16 LAB — TSH: TSH: 1.05 u[IU]/mL (ref 0.35–4.50)

## 2016-04-16 NOTE — Progress Notes (Signed)
Subjective:    Patient ID: Janet Thompson, female    DOB: 1991-03-23, 25 y.o.   MRN: [id number]  Patient presents today for complete physical or establish care (new patient) and left breast pain  Chest Pain   This is a new (left chest wall pain, over left breast) problem. The current episode started 1 to 4 weeks ago. The onset quality is gradual. The problem occurs constantly. The problem has been unchanged. The pain is at a severity of 4/10. The pain is moderate. The quality of the pain is described as dull and tightness. Radiates to: left axilla. Pertinent negatives include no abdominal pain, back pain, claudication, cough, dizziness, exertional chest pressure, fever, headaches, hemoptysis, irregular heartbeat, leg pain, lower extremity edema, malaise/fatigue, nausea, numbness, orthopnea, palpitations, PND, shortness of breath, sputum production, syncope, vomiting or weakness. There are no known risk factors.  Pertinent negatives for past medical history include no anxiety/panic attacks.  Her family medical history is significant for diabetes.  Pertinent negatives for family medical history include: no CAD, no heart disease, no hypertension and no sudden death.  noticed left axilla swollen lymph node 68monthago, no medication used. Has improved in size but still tender to touch. No nipple discharge. Stopped breast feeding 611monthago.  Immunizations: (TDAP, Hep C screen, Pneumovax, Influenza, zoster)  Health Maintenance  Topic Date Due  . Pap Smear  12/21/2018  . Tetanus Vaccine  04/10/2025  . Flu Shot  Completed  . HIV Screening  Completed   Diet:healthy Weight:  Wt Readings from Last 3 Encounters:  04/16/16 127 lb (57.6 kg)  12/27/15 131 lb 2 oz (59.5 kg)  12/21/15 132 lb 9.6 oz (60.1 kg)    Exercise:none Fall Risk: Fall Risk  04/16/2016 04/23/2015 03/12/2015  Falls in the past year? No No No  Risk for fall due to : - - (No Data)  Risk for fall due to (comments): - - na    Home Safety:home with husband and son (1051monthld) Depression/Suicide: [facility] 04/16/2016 08/08/2015  Decreased Interest 0 0  Down, Depressed, Hopeless 0 0  PHQ - 2 Score 0 0  Pap Smear (every 65yr74yrr >21-29 without HPV, every 62yrs65yr >30-662yrs5yr HPV):up to date Vision:up to date Dental:up to date Advanced Directive: Advanced Directives 04/16/2016  Does patient have an advance directive? No  Would patient like information on creating an advanced directive? No - patient declined information   Sexual History (birth control, marital status, STD):married, sexually active, no birth control at this time.  Medications and allergies reviewed with patient and updated if appropriate.  Patient Active Problem List   Diagnosis Date Noted  . Anemia 04/16/2016  . SVD (spontaneous vaginal delivery) 06/24/2015    Current Outpatient Prescriptions on File Prior to Visit  Medication Sig Dispense Refill  . Multiple Vitamin (MULTIVITAMIN) tablet Take 1 tablet by mouth daily.    . [DISCONTINUED] Drospirenone-Ethinyl Estradiol-Levomefol (BEYAZ) 3-0.02-0.451 MG tablet Take 1 tablet by mouth daily.     No current facility-administered medications on file prior to visit.     Past Medical History:  Diagnosis Date  . Medical history non-contributory   . Menstrual irregularity     Past Surgical History:  Procedure Laterality Date  . WISDOM TOOTH EXTRACTION      Social History   Social History  . Marital status: Single    Spouse name: N/A  . Number of children: N/A  . Years of education: N/A   Social  History Main Topics  . Smoking status: Never Smoker  . Smokeless tobacco: Never Used  . Alcohol use No  . Drug use: No  . Sexual activity: Yes    Birth control/ protection: None   Other Topics Concern  . None   Social History Narrative  . None    Family History  Problem Relation Age of Onset  . Cancer Maternal Aunt   . Alcohol abuse Maternal Grandfather      colon  . Diabetes Maternal Grandfather         Review of Systems  Constitutional: Negative for fever, malaise/fatigue and weight loss.  HENT: Negative for congestion and sore throat.   Eyes:       Negative for visual changes  Respiratory: Negative for cough, hemoptysis, sputum production and shortness of breath.   Cardiovascular: Negative for chest pain, palpitations, orthopnea, claudication, leg swelling, syncope and PND.  Gastrointestinal: Negative for abdominal pain, blood in stool, constipation, diarrhea, heartburn, nausea and vomiting.  Genitourinary: Negative for dysuria, frequency and urgency.  Musculoskeletal: Negative for back pain, falls, joint pain and myalgias.  Skin: Negative for rash.  Neurological: Negative for dizziness, sensory change, weakness, numbness and headaches.  Endo/Heme/Allergies: Does not bruise/bleed easily.  Psychiatric/Behavioral: Negative for depression, substance abuse and suicidal ideas. The patient is not nervous/anxious.     Objective:   Vitals:   04/16/16 0932  BP: 118/80  Pulse: 88  Temp: 97.7 F (36.5 C)    Body mass index is 18.22 kg/m.   Physical Examination:  Physical Exam  Constitutional: She is oriented to person, place, and time and well-developed, well-nourished, and in no distress. No distress.  HENT:  Right Ear: External ear normal.  Left Ear: External ear normal.  Nose: Nose normal.  Mouth/Throat: Oropharynx is clear and moist. No oropharyngeal exudate.  Eyes: Conjunctivae and EOM are normal. Pupils are equal, round, and reactive to light. No scleral icterus.  Neck: Normal range of motion. Neck supple. No thyromegaly present.  Cardiovascular: Normal rate, normal heart sounds and intact distal pulses.   Pulmonary/Chest: Effort normal and breath sounds normal. She exhibits tenderness. She exhibits no edema. Right breast exhibits no inverted nipple, no mass, no nipple discharge, no skin change and no tenderness. Left  breast exhibits tenderness. Left breast exhibits no mass, no nipple discharge and no skin change. Breasts are symmetrical.  Left axilla swollen and tender lymph node, fixed.  Mild erythema of skin. Lymph node is <1cm in diameter.  Abdominal: Soft. Bowel sounds are normal. She exhibits no distension. There is no tenderness.  Musculoskeletal: Normal range of motion. She exhibits no edema or tenderness.  Lymphadenopathy:    She has no cervical adenopathy.  Neurological: She is alert and oriented to person, place, and time. Gait normal.  Skin: Skin is warm and dry.  Psychiatric: Affect and judgment normal.    ASSESSMENT and PLAN:  Janet Thompson was seen today for chest pain.  Diagnoses and all orders for this visit:  Encounter for preventative adult health care exam with abnormal findings -     MM Digital Diagnostic Bilat; Future -     US BREAST LTD UNI LEFT INC AXILLA; Future -     CBC w/Diff; Future -     Comp Met (CMET); Future -     TSH; Future -     Lipid panel; Future  Pain of left breast -     MM Digital Diagnostic Bilat; Future -     US BREAST  LTD UNI LEFT INC AXILLA; Future  Axillary lymphadenopathy -     MM Digital Diagnostic Bilat; Future -     US BREAST LTD UNI LEFT INC AXILLA; Future  Anemia, unspecified type -     CBC w/Diff; Future    No problem-specific Assessment & Plan notes found for this encounter.  Recent Results (from the past 2160 hour(s))  CBC w/Diff     Status: None   Collection Time: 04/16/16 10:17 AM  Result Value Ref Range   WBC 5.3 4.0 - 10.5 K/uL   RBC 4.17 3.87 - 5.11 Mil/uL   Hemoglobin 12.4 12.0 - 15.0 g/dL   HCT 36.4 36.0 - 46.0 %   MCV 87.3 78.0 - 100.0 fl   MCHC 34.1 30.0 - 36.0 g/dL   RDW 13.3 11.5 - 15.5 %   Platelets 225.0 150.0 - 400.0 K/uL   Neutrophils Relative % 54.5 43.0 - 77.0 %   Lymphocytes Relative 36.1 12.0 - 46.0 %   Monocytes Relative 6.8 3.0 - 12.0 %   Eosinophils Relative 1.7 0.0 - 5.0 %   Basophils Relative 0.9 0.0 -  3.0 %   Neutro Abs 2.9 1.4 - 7.7 K/uL   Lymphs Abs 1.9 0.7 - 4.0 K/uL   Monocytes Absolute 0.4 0.1 - 1.0 K/uL   Eosinophils Absolute 0.1 0.0 - 0.7 K/uL   Basophils Absolute 0.0 0.0 - 0.1 K/uL  Comp Met (CMET)     Status: None   Collection Time: 04/16/16 10:17 AM  Result Value Ref Range   Sodium 142 135 - 145 mEq/L   Potassium 4.5 3.5 - 5.1 mEq/L   Chloride 107 96 - 112 mEq/L   CO2 29 19 - 32 mEq/L   Glucose, Bld 91 70 - 99 mg/dL   BUN 12 6 - 23 mg/dL   Creatinine, Ser 0.66 0.40 - 1.20 mg/dL   Total Bilirubin 0.8 0.2 - 1.2 mg/dL   Alkaline Phosphatase 68 39 - 117 U/L   AST 16 0 - 37 U/L   ALT 16 0 - 35 U/L   Total Protein 7.3 6.0 - 8.3 g/dL   Albumin 4.8 3.5 - 5.2 g/dL   Calcium 9.6 8.4 - 10.5 mg/dL   GFR 113.66 >60.00 mL/min  TSH     Status: None   Collection Time: 04/16/16 10:17 AM  Result Value Ref Range   TSH 1.05 0.35 - 4.50 uIU/mL  Lipid panel     Status: None   Collection Time: 04/16/16 10:17 AM  Result Value Ref Range   Cholesterol 131 0 - 200 mg/dL    Comment: ATP III Classification       Desirable:  < 200 mg/dL               Borderline High:  200 - 239 mg/dL          High:  > = 240 mg/dL   Triglycerides 62.0 0.0 - 149.0 mg/dL    Comment: Normal:  <150 mg/dLBorderline High:  150 - 199 mg/dL   HDL 56.90 >39.00 mg/dL   VLDL 12.4 0.0 - 40.0 mg/dL   LDL Cholesterol 62 0 - 99 mg/dL   Total CHOL/HDL Ratio 2     Comment:                Men          Women1/2 Average Risk     3.4          3.3Average Risk  5.0          4.42X Average Risk          9.6          7.13X Average Risk          15.0          11.0                       NonHDL 74.17     Comment: NOTE:  Non-HDL goal should be 30 mg/dL higher than patient's LDL goal (i.e. LDL goal of < 70 mg/dL, would have non-HDL goal of < 100 mg/dL)      Follow up: Return in about 3 months (around 07/17/2016) for breast pain and axilla lymph node.  Wilfred Lacy, NP

## 2016-04-16 NOTE — Progress Notes (Signed)
Normal results

## 2016-04-16 NOTE — Progress Notes (Signed)
Pre visit review using our clinic review tool, if applicable. No additional management support is needed unless otherwise documented below in the visit note. 

## 2016-04-16 NOTE — Patient Instructions (Signed)
You will be called with appt for axilla and breast US.  go to basement for lab draw. You will be called with lab results.

## 2016-04-24 ENCOUNTER — Other Ambulatory Visit: Payer: Self-pay | Admitting: Nurse Practitioner

## 2016-04-24 DIAGNOSIS — N644 Mastodynia: Secondary | ICD-10-CM

## 2016-04-25 ENCOUNTER — Ambulatory Visit
Admission: RE | Admit: 2016-04-25 | Discharge: 2016-04-25 | Disposition: A | Discharge status: Home / Self Care | Payer: 59 | Source: Ambulatory Visit | Attending: Nurse Practitioner | Admitting: Nurse Practitioner

## 2016-04-25 DIAGNOSIS — N644 Mastodynia: Secondary | ICD-10-CM | POA: Diagnosis not present

## 2016-04-25 DIAGNOSIS — Z0001 Encounter for general adult medical examination with abnormal findings: Secondary | ICD-10-CM

## 2016-04-25 DIAGNOSIS — R590 Localized enlarged lymph nodes: Secondary | ICD-10-CM

## 2016-04-25 NOTE — Progress Notes (Signed)
Normal results, see office note

## 2016-07-17 ENCOUNTER — Ambulatory Visit: Payer: Self-pay | Admitting: Nurse Practitioner

## 2016-10-03 IMAGING — US US MFM OB DETAIL+14 WK
1 series · 14 of 28 positions shown · non-contrast
Comparison: none

[Series 1: us mfm ob detail+14 wk · 0.25mm/px · 14 of 59 slices shown]
[im 3/59]
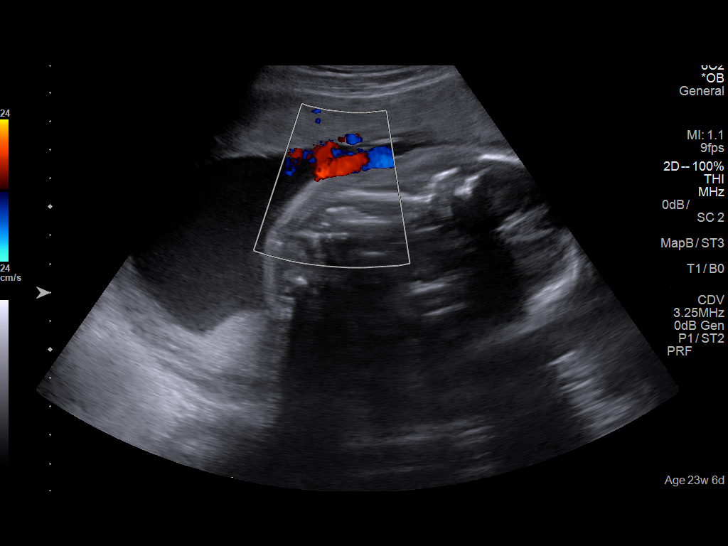
[im 7/59]
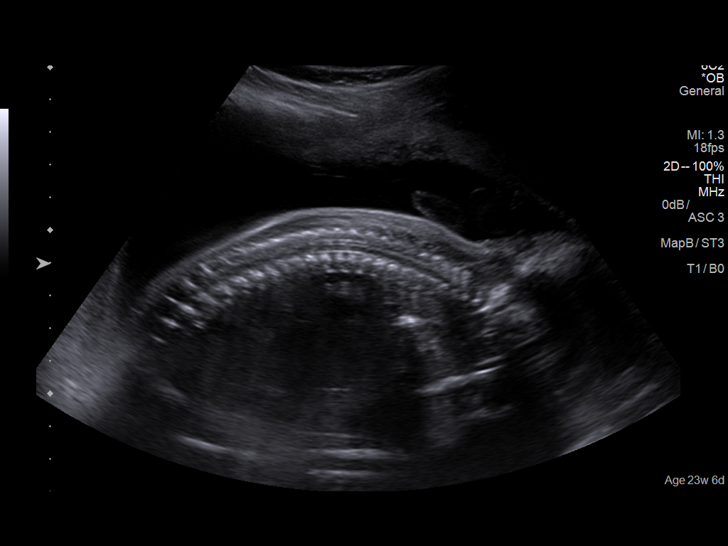
[im 11/59]
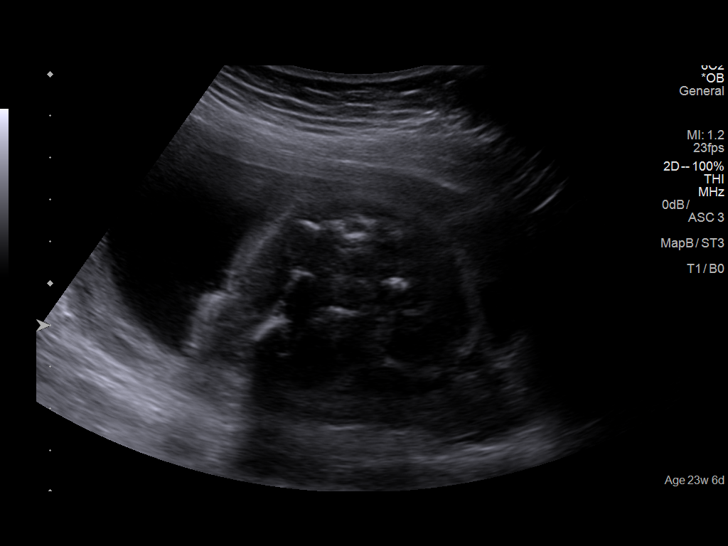
[im 16/59]
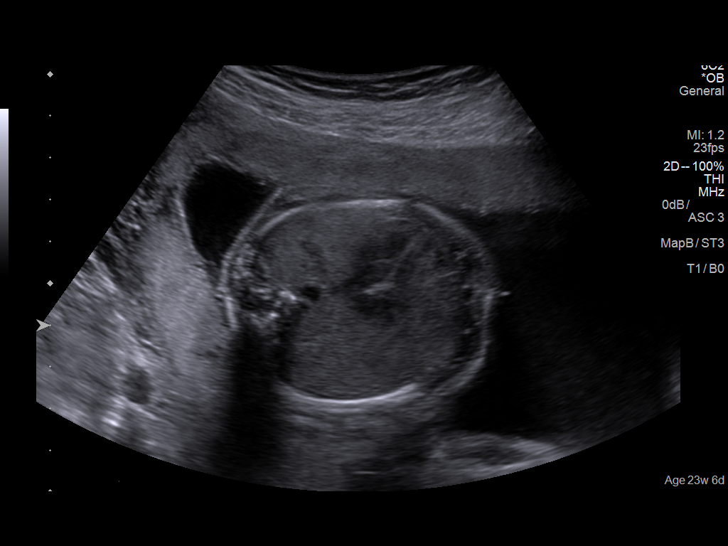
[im 20/59]
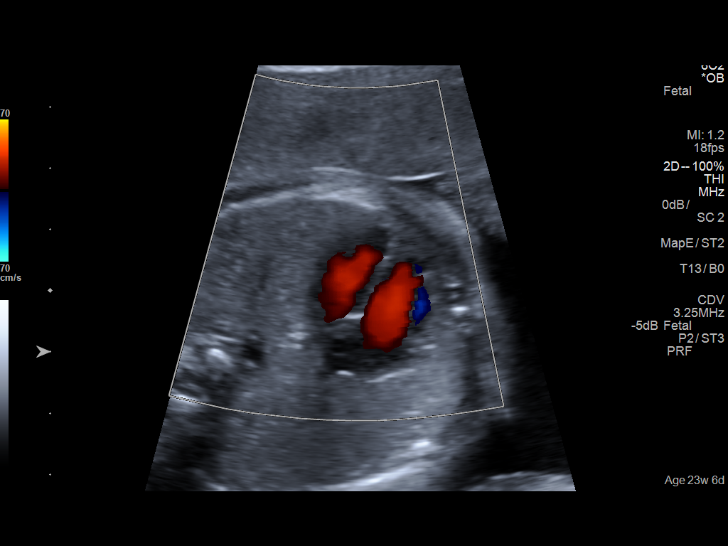
[im 24/59]
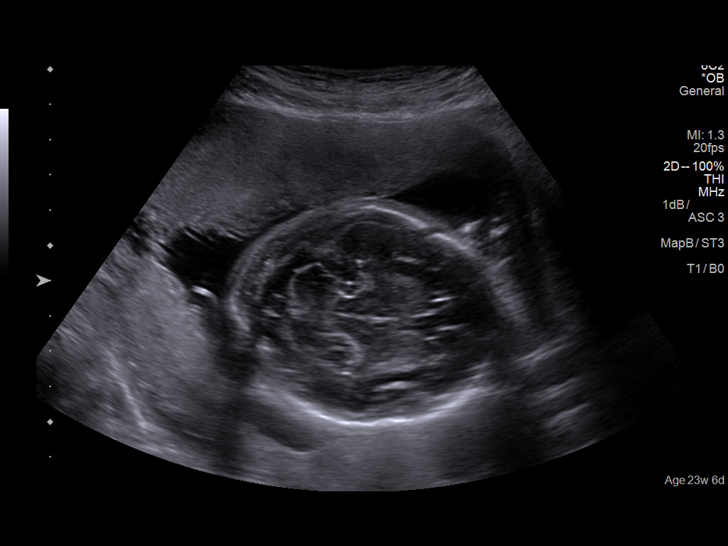
[im 28/59]
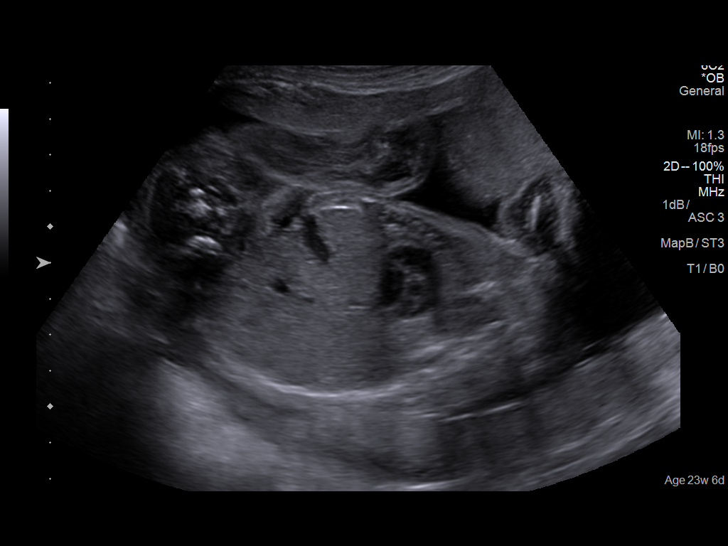
[im 33/59]
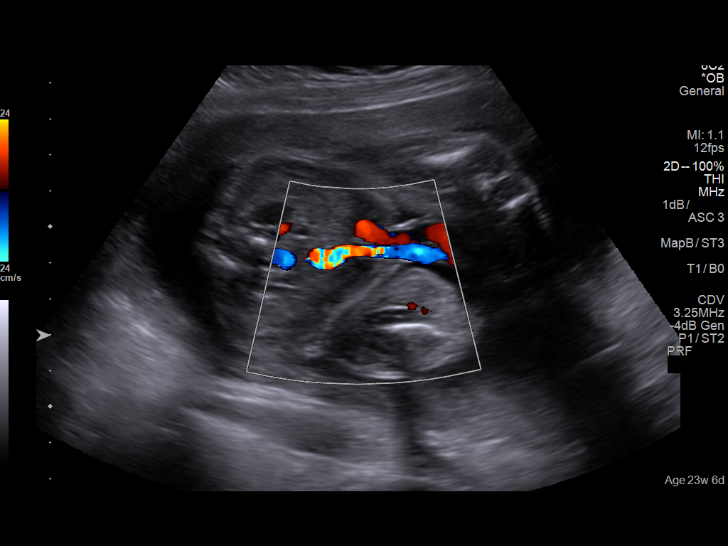
[im 37/59]
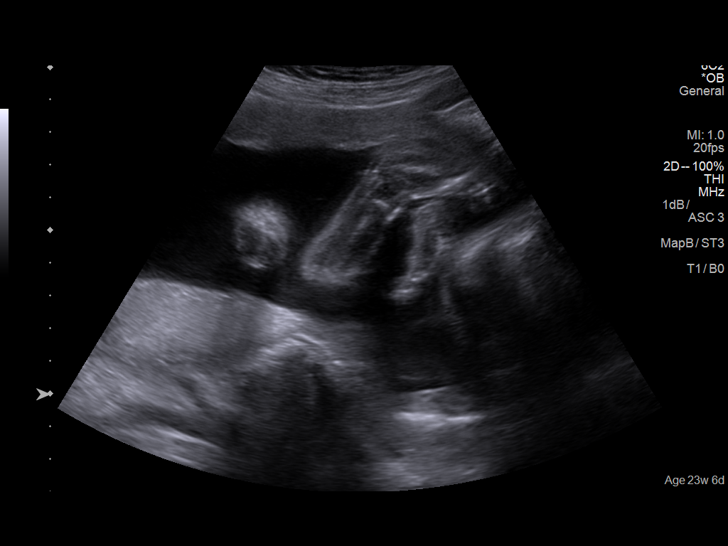
[im 41/59]
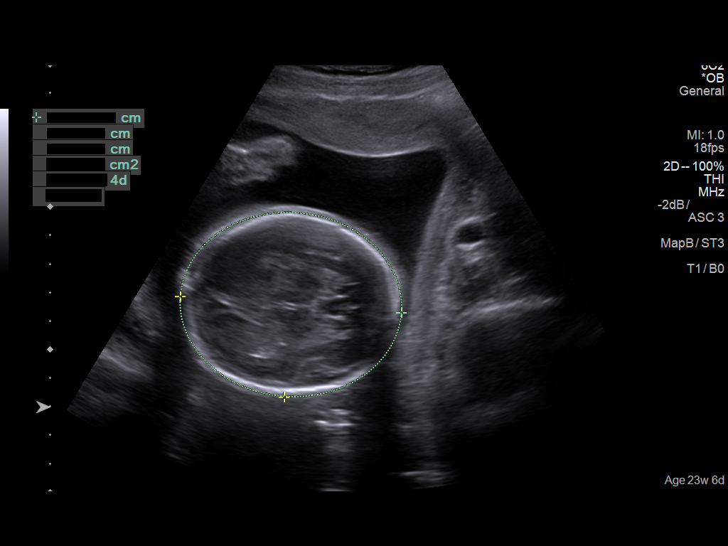
[im 46/59]
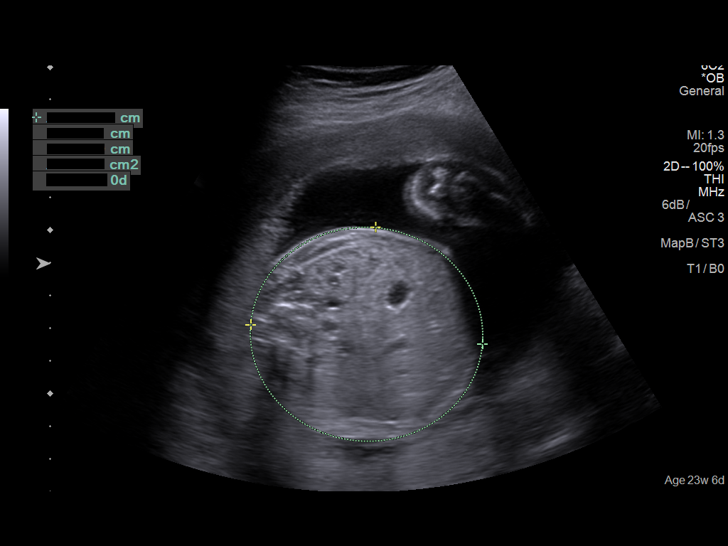
[im 50/59]
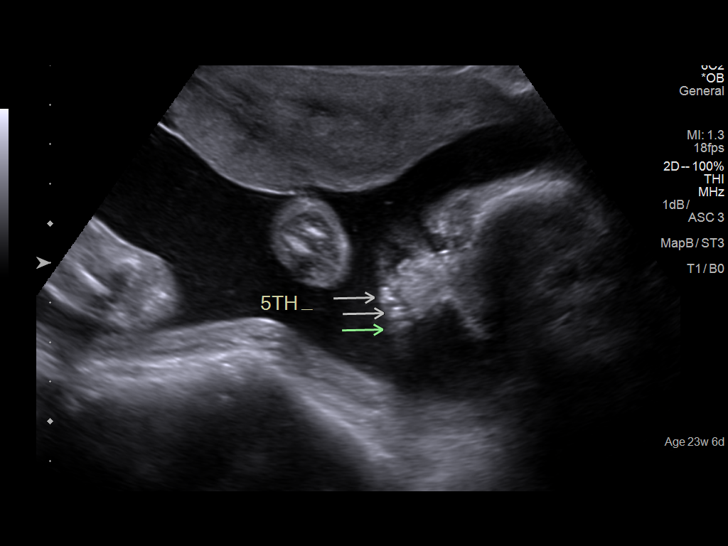
[im 54/59]
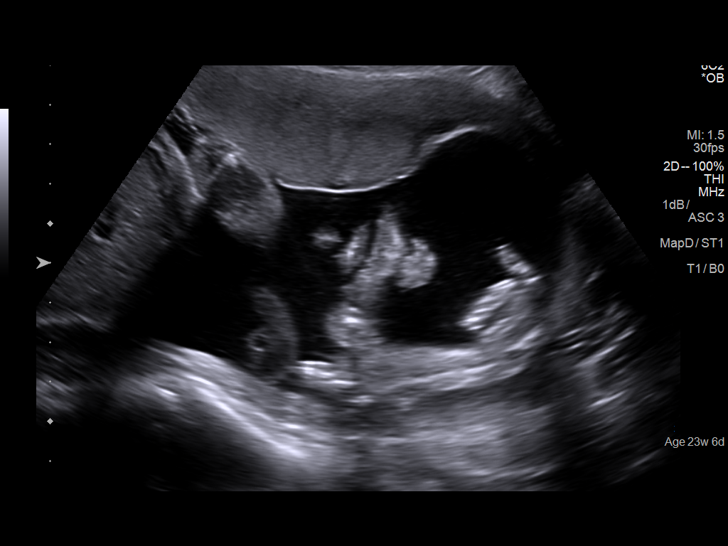
[im 59/59]
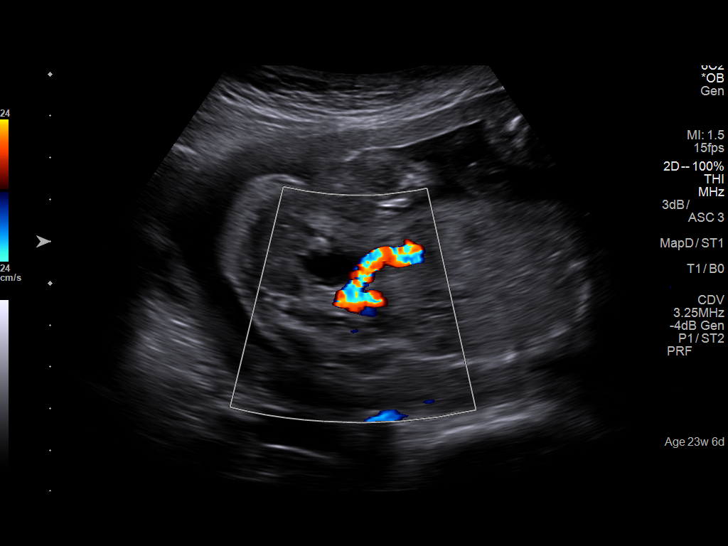

[14 of 28 positions shown; findings below may reference images not displayed]

Canned report from images found in remote index.

Refer to host system for actual result text.

## 2016-12-25 ENCOUNTER — Encounter: Payer: Self-pay | Admitting: Obstetrics and Gynecology

## 2016-12-25 ENCOUNTER — Ambulatory Visit (INDEPENDENT_AMBULATORY_CARE_PROVIDER_SITE_OTHER): Payer: 59 | Admitting: Obstetrics and Gynecology

## 2016-12-25 VITALS — BP 129/84 | HR 100 | Ht 70.0 in | Wt 127.7 lb

## 2016-12-25 DIAGNOSIS — Z01419 Encounter for gynecological examination (general) (routine) without abnormal findings: Secondary | ICD-10-CM

## 2016-12-25 DIAGNOSIS — Z30431 Encounter for routine checking of intrauterine contraceptive device: Secondary | ICD-10-CM | POA: Diagnosis not present

## 2016-12-25 NOTE — Progress Notes (Signed)
   Subjective:     Janet Thompson is a married white 26 y.o. female and is here for a comprehensive physical exam. The patient reports no problems. Monthly menses lasting 5-7 days. Occasional postcoital spotting. Had heavier bleeding after paragard insertion.   Social History   Social History  . Marital status: Single    Spouse name: N/A  . Number of children: N/A  . Years of education: N/A   Occupational History  . Not on file.   Social History Main Topics  . Smoking status: Never Smoker  . Smokeless tobacco: Never Used  . Alcohol use No  . Drug use: No  . Sexual activity: Yes    Birth control/ protection: None   Other Topics Concern  . Not on file   Social History Narrative  . No narrative on file   Health Maintenance  Topic Date Due  . INFLUENZA VACCINE  01/28/2017  . PAP SMEAR  12/21/2018  . TETANUS/TDAP  04/10/2025  . HIV Screening  Completed    The following portions of the patient's history were reviewed and updated as appropriate: allergies, current medications, past family history, past medical history, past social history, past surgical history and problem list.  Review of Systems A comprehensive review of systems was negative.   Objective:    General appearance: alert, cooperative and appears stated age Neck: no adenopathy, no carotid bruit, no JVD, supple, symmetrical, trachea midline and thyroid not enlarged, symmetric, no tenderness/mass/nodules Lungs: clear to auscultation bilaterally Breasts: normal appearance, no masses or tenderness Heart: regular rate and rhythm, S1, S2 normal, no murmur, click, rub or gallop Abdomen: soft, non-tender; bowel sounds normal; no masses,  no organomegaly Pelvic: cervix normal in appearance, external genitalia normal, no adnexal masses or tenderness, no cervical motion tenderness, rectovaginal septum normal, uterus normal size, shape, and consistency, vagina normal without discharge and IUD string noted     Assessment:    Healthy female exam. IUD check     Plan:  Will continue with paragard use at this time RTC as needed or in 1 year.  Leonia Reader, CNM   See After Visit Summary for Counseling Recommendations

## 2016-12-25 NOTE — Patient Instructions (Signed)
Preventive Care 18-39 Years, Female Preventive care refers to lifestyle choices and visits with your health care provider that can promote health and wellness. What does preventive care include?  A yearly physical exam. This is also called an annual well check.  Dental exams once or twice a year.  Routine eye exams. Ask your health care provider how often you should have your eyes checked.  Personal lifestyle choices, including: ? Daily care of your teeth and gums. ? Regular physical activity. ? Eating a healthy diet. ? Avoiding tobacco and drug use. ? Limiting alcohol use. ? Practicing safe sex. ? Taking vitamin and mineral supplements as recommended by your health care provider. What happens during an annual well check? The services and screenings done by your health care provider during your annual well check will depend on your age, overall health, lifestyle risk factors, and family history of disease. Counseling Your health care provider may ask you questions about your:  Alcohol use.  Tobacco use.  Drug use.  Emotional well-being.  Home and relationship well-being.  Sexual activity.  Eating habits.  Work and work Statistician.  Method of birth control.  Menstrual cycle.  Pregnancy history.  Screening You may have the following tests or measurements:  Height, weight, and BMI.  Diabetes screening. This is done by checking your blood sugar (glucose) after you have not eaten for a while (fasting).  Blood pressure.  Lipid and cholesterol levels. These may be checked every 5 years starting at age 38.  Skin check.  Hepatitis C blood test.  Hepatitis B blood test.  Sexually transmitted disease (STD) testing.  BRCA-related cancer screening. This may be done if you have a family history of breast, ovarian, tubal, or peritoneal cancers.  Pelvic exam and Pap test. This may be done every 3 years starting at age 38. Starting at age 30, this may be done  every 5 years if you have a Pap test in combination with an HPV test.  Discuss your test results, treatment options, and if necessary, the need for more tests with your health care provider. Vaccines Your health care provider may recommend certain vaccines, such as:  Influenza vaccine. This is recommended every year.  Tetanus, diphtheria, and acellular pertussis (Tdap, Td) vaccine. You may need a Td booster every 10 years.  Varicella vaccine. You may need this if you have not been vaccinated.  HPV vaccine. If you are 39 or younger, you may need three doses over 6 months.  Measles, mumps, and rubella (MMR) vaccine. You may need at least one dose of MMR. You may also need a second dose.  Pneumococcal 13-valent conjugate (PCV13) vaccine. You may need this if you have certain conditions and were not previously vaccinated.  Pneumococcal polysaccharide (PPSV23) vaccine. You may need one or two doses if you smoke cigarettes or if you have certain conditions.  Meningococcal vaccine. One dose is recommended if you are age 68-21 years and a first-year college student living in a residence hall, or if you have one of several medical conditions. You may also need additional booster doses.  Hepatitis A vaccine. You may need this if you have certain conditions or if you travel or work in places where you may be exposed to hepatitis A.  Hepatitis B vaccine. You may need this if you have certain conditions or if you travel or work in places where you may be exposed to hepatitis B.  Haemophilus influenzae type b (Hib) vaccine. You may need this  if you have certain risk factors.  Talk to your health care provider about which screenings and vaccines you need and how often you need them. This information is not intended to replace advice given to you by your health care provider. Make sure you discuss any questions you have with your health care provider. Document Released: 08/12/2001 Document Revised:  03/05/2016 Document Reviewed: 04/17/2015 Elsevier Interactive Patient Education  2017 Elsevier Inc.  

## 2017-04-02 DIAGNOSIS — H6690 Otitis media, unspecified, unspecified ear: Secondary | ICD-10-CM | POA: Diagnosis not present

## 2017-04-22 ENCOUNTER — Encounter: Payer: Self-pay | Admitting: Obstetrics and Gynecology

## 2017-04-22 ENCOUNTER — Encounter: Payer: 59 | Admitting: Obstetrics and Gynecology

## 2017-04-22 ENCOUNTER — Ambulatory Visit (INDEPENDENT_AMBULATORY_CARE_PROVIDER_SITE_OTHER): Payer: 59 | Admitting: Obstetrics and Gynecology

## 2017-04-22 VITALS — BP 129/75 | HR 93 | Ht 70.0 in | Wt 133.3 lb

## 2017-04-22 DIAGNOSIS — Z975 Presence of (intrauterine) contraceptive device: Secondary | ICD-10-CM | POA: Diagnosis not present

## 2017-04-22 DIAGNOSIS — N921 Excessive and frequent menstruation with irregular cycle: Secondary | ICD-10-CM | POA: Diagnosis not present

## 2017-04-22 DIAGNOSIS — Z30432 Encounter for removal of intrauterine contraceptive device: Secondary | ICD-10-CM

## 2017-04-22 MED ORDER — DESOGESTREL-ETHINYL ESTRADIOL 0.15-0.02/0.01 MG (21/5) PO TABS: 1.0000 | ORAL_TABLET | Freq: Every day | ORAL | 11 refills | Status: DC

## 2017-04-22 MED FILL — DESOGESTR-ETH ESTRAD ETH ES: 0.15-0.02/0 | 28 days supply | Qty: 28 | Fill #0

## 2017-04-22 NOTE — Progress Notes (Signed)
Janet Thompson is a 26 y.o. year old G25P1001 Caucasian female who presents for removal of a Mirena IUD. Her Paragard IUD was placed 18 months ago. She has been having heavy menses twice a month for 7-10 days with lower left sided cramping. Desires to switch back to OCPs.  Patient's last menstrual period was 03/31/2017. BP 129/75   Pulse 93   Ht 5\' 10"  (1.778 m)   Wt 133 lb 4.8 oz (60.5 kg)   LMP 03/31/2017   BMI 19.13 kg/m   Time out was performed.  A graves speculum was placed in the vagina.  The cervix was visualized, and the strings were visible. They were grasped and the Mirena was easily removed intact without complications.   F/U as needed.  Melody Rockney Ghee, CNM

## 2017-06-03 MED FILL — DESOGESTR-ETH ESTRAD ETH ES: 0.15-0.02/0 | 84 days supply | Qty: 84 | Fill #1

## 2017-07-23 ENCOUNTER — Encounter: Payer: Self-pay | Admitting: Emergency Medicine

## 2017-07-23 ENCOUNTER — Ambulatory Visit (INDEPENDENT_AMBULATORY_CARE_PROVIDER_SITE_OTHER): Payer: Self-pay | Admitting: Emergency Medicine

## 2017-07-23 VITALS — BP 120/76 | HR 95 | Temp 97.5°F | Resp 20 | Wt 133.6 lb

## 2017-07-23 DIAGNOSIS — J069 Acute upper respiratory infection, unspecified: Secondary | ICD-10-CM

## 2017-07-23 DIAGNOSIS — R52 Pain, unspecified: Secondary | ICD-10-CM

## 2017-07-23 LAB — POCT INFLUENZA A/B
INFLUENZA A, POC: NEGATIVE
INFLUENZA B, POC: NEGATIVE

## 2017-07-23 NOTE — Patient Instructions (Signed)

## 2017-07-23 NOTE — Progress Notes (Signed)
Subjective:     Janet Thompson is a 27 y.o. female who presents for evaluation of symptoms of a URI. Symptoms include achiness, congestion, nasal congestion, no  fever, non productive cough and sneezing. Onset of symptoms was 1 day ago, and has been unchanged since that time. Treatment to date: ibuprofen. 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  Review of Systems Pertinent items noted in HPI and remainder of comprehensive ROS otherwise negative.   Objective:    BP 120/76 (BP Location: Right Arm, Patient Position: Sitting, Cuff Size: Normal)   Pulse 95   Temp (!) 97.5 F (36.4 C) (Oral)   Resp 20   Wt 133 lb 9.6 oz (60.6 kg)   SpO2 98%   BMI 19.17 kg/m  General appearance: alert, cooperative and appears stated age Head: Normocephalic, without obvious abnormality, atraumatic Ears: normal TM's and external ear canals both ears Nose: Nares normal. Septum midline. Mucosa normal. No drainage or sinus tenderness. Throat: lips, mucosa, and tongue normal; teeth and gums normal Neck: no adenopathy Lungs: clear to auscultation bilaterally Heart: regular rate and rhythm Pulses: 2+ and symmetric Skin: Skin color, texture, turgor normal. No rashes or lesions    Influenza A/B negative  Assessment:    viral upper respiratory illness   Plan:    Discussed diagnosis and treatment of URI. Suggested symptomatic OTC remedies. Nasal saline spray for congestion. Follow up as needed.

## 2017-07-24 ENCOUNTER — Telehealth: Payer: No Typology Code available for payment source | Admitting: Family

## 2017-07-24 DIAGNOSIS — J029 Acute pharyngitis, unspecified: Secondary | ICD-10-CM

## 2017-07-24 MED ORDER — BENZONATATE 100 MG PO CAPS: 100.0000 mg | ORAL_CAPSULE | Freq: Three times a day (TID) | ORAL | 0 refills | Status: DC | PRN

## 2017-07-24 MED ORDER — FLUTICASONE PROPIONATE 50 MCG/ACT NA SUSP: 2.0000 | Freq: Every day | NASAL | 6 refills | Status: DC

## 2017-07-24 MED FILL — BENZONATATE 100 MG CAPS: 100 | 5 days supply | Qty: 30 | Fill #0

## 2017-07-24 MED FILL — FLUTICASONE PROP 50 MCG SPR: 50 | 30 days supply | Qty: 16 | Fill #0

## 2017-07-24 NOTE — Progress Notes (Signed)
Thank you for the details you included in the comment boxes. Those details are very helpful in determining the best course of treatment for you and help [place] to provide the best care. The Sudafed is over the counter. Also, if you are very congested, I would recommend going to [facility] and asking the pharmacist for Bronkaid (it is stronger than Sudafed and works much better). I am sending the Rx for flonase also. See other items below.  We are sorry that you are not feeling well.  Here is how we plan to help!  Based on your presentation I believe you most likely have A cough due to a virus.  This is called viral bronchitis and is best treated by rest, plenty of fluids and control of the cough.  You may use Ibuprofen or Tylenol as directed to help your symptoms.     In addition you may use A non-prescription cough medication called Mucinex DM: take 2 tablets every 12 hours. and A prescription cough medication called Tessalon Perles 100mg . You may take 1-2 capsules every 8 hours as needed for your cough.   From your responses in the eVisit questionnaire you describe inflammation in the upper respiratory tract which is causing a significant cough.  This is commonly called Bronchitis and has four common causes:    Allergies  Viral Infections  Acid Reflux  Bacterial Infection Allergies, viruses and acid reflux are treated by controlling symptoms or eliminating the cause. An example might be a cough caused by taking certain blood pressure medications. You stop the cough by changing the medication. Another example might be a cough caused by acid reflux. Controlling the reflux helps control the cough.  USE OF BRONCHODILATOR ("RESCUE") INHALERS: There is a risk from using your bronchodilator too frequently.  The risk is that over-reliance on a medication which only relaxes the muscles surrounding the breathing tubes can reduce the effectiveness of medications prescribed to reduce swelling and congestion of the  tubes themselves.  Although you feel brief relief from the bronchodilator inhaler, your asthma may actually be worsening with the tubes becoming more swollen and filled with mucus.  This can delay other crucial treatments, such as oral steroid medications. If you need to use a bronchodilator inhaler daily, several times per day, you should discuss this with your provider.  There are probably better treatments that could be used to keep your asthma under control.     HOME CARE . Only take medications as instructed by your medical team. . Complete the entire course of an antibiotic. . Drink plenty of fluids and get plenty of rest. . Avoid close contacts especially the very young and the elderly . Cover your mouth if you cough or cough into your sleeve. . Always remember to wash your hands . A steam or ultrasonic humidifier can help congestion.   GET HELP RIGHT AWAY IF: . You develop worsening fever. . You become short of breath . You cough up blood. . Your symptoms persist after you have completed your treatment plan MAKE SURE YOU   Understand these instructions.  Will watch your condition.  Will get help right away if you are not doing well or get worse.  Your e-visit answers were reviewed by a board certified advanced clinical practitioner to complete your personal care plan.  Depending on the condition, your plan could have included both over the counter or prescription medications. If there is a problem please reply  once you have received a response from  your provider. Your safety is important to [place].  If you have drug allergies check your prescription carefully.    You can use MyChart to ask questions about today's visit, request a non-urgent call back, or ask for a work or school excuse for 24 hours related to this e-Visit. If it has been greater than 24 hours you will need to follow up with your provider, or enter a new e-Visit to address those concerns. You will get an e-mail in  the next two days asking about your experience.  I hope that your e-visit has been valuable and will speed your recovery. Thank you for using e-visits.

## 2017-08-15 NOTE — Progress Notes (Signed)
HPI:  [name] is here to establish care.  Last PCP and physical: Reports has not had a primary care doctor in several years, but sees her gynecologist in [place] on a regular basis for physicals.  Has the following chronic problems that require follow up and concerns today:  Migraines: -Reports she has had these for ever -Was on prophylactic medicine, propranolol remotely -She does not want to be on medications for these- -gets about 2 migraines per month, takes over-the-counter medications for these -Stable, unchanged  History of positive skin test: -remotely -reports saw health department -Positive skin test and QuantiFERON gold -Negative chest x-ray -She opted against treatment -Never symptomatic  ROS negative for unless reported above: fevers, unintentional weight loss, hearing or vision loss, chest pain, palpitations, struggling to breath, hemoptysis, melena, hematochezia, hematuria, falls, loc, si, thoughts of self harm  Past Medical History:  Diagnosis Date  . Frequent headaches   . History of fainting spells of unknown cause   . Medical history non-contributory   . Menstrual irregularity   . Migraines   . 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 WISDOM TOOTH EXTRACTION      Family History  Problem Relation Age of Onset  . Cancer Maternal Aunt   . Alcohol abuse Maternal Grandfather        colon  . Diabetes Paternal Grandmother     Social History   Socioeconomic History  . Marital status: Single    Spouse name: None  . Number of children: None  . Years of education: None  . Highest education level: None  Social Needs  . Financial resource strain: None  . Food insecurity - worry: None  . Food insecurity - inability: None  . Transportation needs - medical: None  . Transportation needs - non-medical: None  Occupational History  . None  Tobacco Use  . Smoking status: Never Smoker  .  Smokeless tobacco: Never Used  Substance and Sexual Activity  . Alcohol use: No  . Drug use: No  . Sexual activity: Yes    Birth control/protection: None, Pill  Other Topics Concern  . None  Social History Narrative  . None     Current Outpatient Medications:  .  desogestrel-ethinyl estradiol (KARIVA,AZURETTE,MIRCETTE) 0.15-0.02/0.01 MG (21/5) tablet, Take 1 tablet by mouth daily., Disp: , Rfl:  .  Multiple Vitamin (MULTIVITAMIN) tablet, Take 1 tablet by mouth daily., Disp: , Rfl:   EXAM:  Vitals:   08/17/17 1049  BP: 100/60  Pulse: 90  Temp: 98.3 F (36.8 C)    Body mass index is 19.3 kg/m.  GENERAL: vitals reviewed and listed above, alert, oriented, appears well hydrated and in no acute distress  HEENT: atraumatic, conjunttiva clear, no obvious abnormalities on inspection of external nose and ears  NECK: no obvious masses on inspection  LUNGS: clear to auscultation bilaterally, no wheezes, rales or rhonchi, good air movement  CV: HRRR, no peripheral edema  MS: moves all extremities without noticeable abnormality  PSYCH: pleasant and cooperative, no obvious depression or anxiety  ASSESSMENT AND PLAN:  Discussed the following assessment and plan:  Other migraine without status migrainosus, not intractable -Discussed possible treatments -She prefers to not take medications, discussed journal, identifying and avoiding triggers, avoiding regular use of over-the-counter analgesics to prevent rebound headaches, consideration magnesium, acupuncture, cranial, etc.  H/O TB skin testing -Discussed implications  Encounter to establish care -We reviewed the PMH, PSH, FH,  SH, Meds and Allergies. -We provided refills for any medications we will prescribe as needed. -We addressed current concerns per orders and patient instructions. -We have asked for records for pertinent exams, studies, vaccines and notes from previous providers. -We have advised patient to follow up  per instructions below.   -Follow-up as needed and for yearly physicals. Declined AVS. There are no Patient Instructions on file for this visit.   Janet Thompson

## 2017-08-17 ENCOUNTER — Encounter: Payer: Self-pay | Admitting: Family Medicine

## 2017-08-17 ENCOUNTER — Ambulatory Visit (INDEPENDENT_AMBULATORY_CARE_PROVIDER_SITE_OTHER): Payer: No Typology Code available for payment source | Admitting: Family Medicine

## 2017-08-17 VITALS — BP 100/60 | HR 90 | Temp 98.3°F | Ht 69.0 in | Wt 130.7 lb

## 2017-08-17 DIAGNOSIS — G43809 Other migraine, not intractable, without status migrainosus: Secondary | ICD-10-CM | POA: Diagnosis not present

## 2017-08-17 DIAGNOSIS — Z7689 Persons encountering health services in other specified circumstances: Secondary | ICD-10-CM

## 2017-08-17 DIAGNOSIS — Z9289 Personal history of other medical treatment: Secondary | ICD-10-CM

## 2017-08-17 DIAGNOSIS — G43909 Migraine, unspecified, not intractable, without status migrainosus: Secondary | ICD-10-CM | POA: Insufficient documentation

## 2017-08-22 ENCOUNTER — Encounter: Payer: Self-pay | Admitting: Nurse Practitioner

## 2017-08-22 ENCOUNTER — Ambulatory Visit (INDEPENDENT_AMBULATORY_CARE_PROVIDER_SITE_OTHER): Payer: Self-pay | Admitting: Nurse Practitioner

## 2017-08-22 VITALS — BP 95/65 | HR 105 | Temp 98.9°F | Wt 130.6 lb

## 2017-08-22 DIAGNOSIS — Z20828 Contact with and (suspected) exposure to other viral communicable diseases: Secondary | ICD-10-CM

## 2017-08-22 DIAGNOSIS — J029 Acute pharyngitis, unspecified: Secondary | ICD-10-CM

## 2017-08-22 DIAGNOSIS — R6889 Other general symptoms and signs: Secondary | ICD-10-CM

## 2017-08-22 LAB — POCT INFLUENZA A/B
INFLUENZA A, POC: NEGATIVE
INFLUENZA B, POC: NEGATIVE

## 2017-08-22 LAB — POCT RAPID STREP A (OFFICE): RAPID STREP A SCREEN: NEGATIVE

## 2017-08-22 MED ORDER — OSELTAMIVIR PHOSPHATE 75 MG PO CAPS: 75.0000 mg | ORAL_CAPSULE | Freq: Two times a day (BID) | ORAL | 0 refills | Status: DC

## 2017-08-22 NOTE — Patient Instructions (Signed)

## 2017-08-22 NOTE — Progress Notes (Signed)
   Subjective:    Patient ID: Janet Thompson, female    DOB: 25-Nov-1990, 27 y.o.   MRN: [id number]  HPI Patient comes in today c/o headache that started yesterday evening. Progressed to body aches , fever 101.2 , sore throat and congestion.    Review of Systems  Constitutional: Positive for appetite change (decreased), chills, fatigue and fever.  HENT: Positive for congestion, ear pain, postnasal drip, rhinorrhea, sore throat, trouble swallowing and voice change.   Respiratory: Positive for cough.   Gastrointestinal: Positive for nausea.  Musculoskeletal: Positive for myalgias.  Neurological: Positive for headaches.       Objective:   Physical Exam  Constitutional: She is oriented to person, place, and time. She appears well-developed and well-nourished. She appears distressed (mild).  HENT:  Right Ear: Hearing, tympanic membrane, external ear and ear canal normal.  Left Ear: Hearing, tympanic membrane, external ear and ear canal normal.  Nose: Mucosal edema and rhinorrhea present. Right sinus exhibits no maxillary sinus tenderness and no frontal sinus tenderness. Left sinus exhibits no maxillary sinus tenderness and no frontal sinus tenderness.  Mouth/Throat: Uvula is midline. Posterior oropharyngeal erythema (mild) present.  Eyes: Conjunctivae are normal. Pupils are equal, round, and reactive to light.  Neck: Normal range of motion. Neck supple.  Cardiovascular: Normal rate and regular rhythm.  Pulmonary/Chest: Effort normal and breath sounds normal. No respiratory distress. She has no wheezes.  Dry cough   Lymphadenopathy:    She has no cervical adenopathy.  Neurological: She is alert and oriented to person, place, and time.  Skin: Skin is warm.  Psychiatric: She has a normal mood and affect. Her behavior is normal. Judgment and thought content normal.    BP 95/65 (BP Location: Right Arm, Patient Position: Sitting, Cuff Size: Normal)   Pulse (!) 105   Temp 98.9 F  (37.2 C) (Oral)   Wt 130 lb 9.6 oz (59.2 kg)   LMP 07/28/2017 (Exact Date)   HC 16" (40.6 cm)   SpO2 98%   BMI 19.29 kg/m        Assessment & Plan:   1. Flu-like symptoms   2. Sore throat   3. Exposure to influenza    Meds ordered this encounter  Medications  . oseltamivir (TAMIFLU) 75 MG capsule    Sig: Take 1 capsule (75 mg total) by mouth 2 (two) times daily.    Dispense:  10 capsule    Refill:  0    Order Specific Question:   Supervising Provider    Answer:   Benay Pillow E [[id number]]   1. Take meds as prescribed 2. Use a cool mist humidifier especially during the winter months and when heat has been humid. 3. Use saline nose sprays frequently 4. Saline irrigations of the nose can be very helpful if done frequently.  * 4X daily for 1 week*  * Use of a nettie pot can be helpful with this. Follow directions with this* 5. Drink plenty of fluids 6. Keep thermostat turn down low 7.For any cough or congestion  Use plain Mucinex- regular strength or max strength is fine   * Children- consult with Pharmacist for dosing 8. For fever or aces or pains- take tylenol or ibuprofen appropriate for age and weight.  * for fevers greater than 101 orally you may alternate ibuprofen and tylenol every  3 hours.   Mary-Margaret Hassell Done, FNP

## 2017-08-24 MED FILL — DESOGESTR-ETH ESTRAD ETH ES: 0.15-0.02/0 | 84 days supply | Qty: 84 | Fill #2

## 2017-08-26 ENCOUNTER — Telehealth: Payer: Self-pay

## 2017-10-26 ENCOUNTER — Encounter: Payer: Self-pay | Admitting: Family Medicine

## 2017-11-05 MED FILL — FLUTICASONE PROP 50 MCG SPR: 50 | 30 days supply | Qty: 16 | Fill #1

## 2017-11-05 MED FILL — DESOGESTR-ETH ESTRAD ETH ES: 0.15-0.02/0 | 84 days supply | Qty: 84 | Fill #3

## 2017-11-15 NOTE — Progress Notes (Signed)
HPI:  Using dictation device. Unfortunately this device frequently misinterprets words/phrases.  Here for CPE:  -Concerns and/or follow up today:  Reports migraines much better and has been feeling much better overall. Rare migraine that resolves with ibuprofen 1-2 times per month. Taking claritin and flonase for allergies and doing well. Eating health and getting regular exercise. Sees dermatologist for skin exams.  -Taking folic acid, vitamin D or calcium: no -Diabetes and Dyslipidemia Screening: fasting for labs -Vaccines: see vaccine section EPIC -pap history: neg pap 11/2015 - see gyn in  yearly -FDLMP: see nursing notes -FH breast, colon or ovarian ca: see FH Last mammogram: n/a Last colon cancer screening: n/a Breast Ca Risk Assessment: see family history and pt history DEXA (>/= 65): n/a  -Alcohol, Tobacco, drug use: see social history  Review of Systems - no reported fevers, unintentional weight loss, vision loss, hearing loss, chest pain, sob, hemoptysis, melena, hematochezia, hematuria, genital discharge, changing or concerning skin lesions, bleeding, bruising, loc, thoughts of self harm or SI  Past Medical History:  Diagnosis Date  . Frequent headaches   . History of fainting spells of unknown cause   . Medical history non-contributory   . Menstrual irregularity   . Migraines   . 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 WISDOM TOOTH EXTRACTION      Family History  Problem Relation Age of Onset  . Cancer Maternal Aunt   . Alcohol abuse Maternal Grandfather        colon  . Diabetes Paternal Grandmother     Social History   Socioeconomic History  . Marital status: Significant Other    Spouse name: Not on file  . Number of children: Not on file  . Years of education: Not on file  . Highest education level: Not on file  Occupational History  . Not on file  Social Needs  . Financial  resource strain: Not on file  . Food insecurity:    Worry: Not on file    Inability: Not on file  . Transportation needs:    Medical: Not on file    Non-medical: Not on file  Tobacco Use  . Smoking status: Never Smoker  . Smokeless tobacco: Never Used  Substance and Sexual Activity  . Alcohol use: No  . Drug use: No  . Sexual activity: Yes    Birth control/protection: None, Pill  Lifestyle  . Physical activity:    Days per week: Not on file    Minutes per session: Not on file  . Stress: Not on file  Relationships  . Social connections:    Talks on phone: Not on file    Gets together: Not on file    Attends religious service: Not on file    Active member of club or organization: Not on file    Attends meetings of clubs or organizations: Not on file    Relationship status: Not on file  Other Topics Concern  . Not on file  Social History Narrative   Work or School: [employer] [place] Situation: Has 58-year-old son and 2019      Spiritual Beliefs:      Lifestyle: Healthy diet, regular activity     Current Outpatient Medications:  .  desogestrel-ethinyl estradiol (KARIVA,AZURETTE,MIRCETTE) 0.15-0.02/0.01 MG (21/5) tablet, Take 1 tablet by mouth daily., Disp: , Rfl:  .  fluticasone (FLONASE) 50 MCG/ACT nasal spray, Place 1  spray into both nostrils daily., Disp: , Rfl:  .  loratadine (CLARITIN) 10 MG tablet, Take 10 mg by mouth daily., Disp: , Rfl:  .  Magnesium 200 MG TABS, Take by mouth at bedtime., Disp: , Rfl:   EXAM:  Vitals:   11/16/17 0904  BP: 102/80  Pulse: 83  Temp: 98.2 F (36.8 C)    GENERAL: vitals reviewed and listed below, alert, oriented, appears well hydrated and in no acute distress  HEENT: head atraumatic, PERRLA, normal appearance of eyes, ears, nose and mouth. moist mucus membranes.  NECK: supple, no masses or lymphadenopathy  LUNGS: clear to auscultation bilaterally, no rales, rhonchi or wheeze  CV: HRRR, no peripheral  edema or cyanosis, normal pedal pulses  ABDOMEN: bowel sounds normal, soft, non tender to palpation, no masses, no rebound or guarding  GU/BREAST: sees gyn  SKIN: no rash or abnormal lesions, sees dermatologist for full skin exams  MS: normal gait, moves all extremities normally  NEURO: normal gait, speech and thought processing grossly intact, muscle tone grossly intact throughout  PSYCH: normal affect, pleasant and cooperative  ASSESSMENT AND PLAN:  Discussed the following assessment and plan:  PREVENTIVE EXAM: -Discussed and advised all [place] preventive services health task force level A and B recommendations for age, sex and risks. -Advised at least 150 minutes of exercise per week and a healthy diet with avoidance of (less then 1 serving per week) processed foods, white starches, red meat, fast foods and sweets and consisting of: * 5-9 servings of fresh fruits and vegetables (not corn or potatoes) *nuts and seeds, beans *olives and olive oil *lean meats such as fish and white chicken  *whole grains -labs, studies and vaccines per orders this encounter   Patient advised to return to clinic immediately if symptoms worsen or persist or new concerns.  Patient Instructions  BEFORE YOU LEAVE: -labs -follow up: yearly for CPE  We have ordered labs or studies at this visit. It can take up to 1-2 weeks for results and processing. IF results require follow up or explanation, we will call you with instructions. Clinically stable results will be released to your [facility]. If you have not heard from [place] or cannot find your results in [facility] in 2 weeks please contact our office at [phone].  If you are not yet signed up for [facility], please consider signing up.   Preventive Care 18-39 Years, Female Preventive care refers to lifestyle choices and visits with your health care provider that can promote health and wellness. What does preventive care include?  A yearly physical exam.  This is also called an annual well check.  Dental exams once or twice a year.  Routine eye exams. Ask your health care provider how often you should have your eyes checked.  Personal lifestyle choices, including: ? Daily care of your teeth and gums. ? Regular physical activity. ? Eating a healthy diet. ? Avoiding tobacco and drug use. ? Limiting alcohol use. ? Practicing safe sex. ? Taking vitamin and mineral supplements as recommended by your health care provider. What happens during an annual well check? The services and screenings done by your health care provider during your annual well check will depend on your age, overall health, lifestyle risk factors, and family history of disease. Counseling Your health care provider may ask you questions about your:  Alcohol use.  Tobacco use.  Drug use.  Emotional well-being.  Home and relationship well-being.  Sexual activity.  Eating habits.  Work and  work environment.  Method of birth control.  Menstrual cycle.  Pregnancy history.  Screening You may have the following tests or measurements:  Height, weight, and BMI.  Diabetes screening. This is done by checking your blood sugar (glucose) after you have not eaten for a while (fasting).  Blood pressure.  Lipid and cholesterol levels. These may be checked every 5 years starting at age 28.  Skin check.  Hepatitis C blood test.  Hepatitis B blood test.  Sexually transmitted disease (STD) testing.  BRCA-related cancer screening. This may be done if you have a family history of breast, ovarian, tubal, or peritoneal cancers.  Pelvic exam and Pap test. This may be done every 3 years starting at age 3. Starting at age 29, this may be done every 5 years if you have a Pap test in combination with an HPV test.  Discuss your test results, treatment options, and if necessary, the need for more tests with your health care provider. Vaccines Your health care provider  may recommend certain vaccines, such as:  Influenza vaccine. This is recommended every year.  Tetanus, diphtheria, and acellular pertussis (Tdap, Td) vaccine. You may need a Td booster every 10 years.  Varicella vaccine. You may need this if you have not been vaccinated.  HPV vaccine. If you are 38 or younger, you may need three doses over 6 months.  Measles, mumps, and rubella (MMR) vaccine. You may need at least one dose of MMR. You may also need a second dose.  Pneumococcal 13-valent conjugate (PCV13) vaccine. You may need this if you have certain conditions and were not previously vaccinated.  Pneumococcal polysaccharide (PPSV23) vaccine. You may need one or two doses if you smoke cigarettes or if you have certain conditions.  Meningococcal vaccine. One dose is recommended if you are age 29-21 years and a first-year college student living in a residence hall, or if you have one of several medical conditions. You may also need additional booster doses.  Hepatitis A vaccine. You may need this if you have certain conditions or if you travel or work in places where you may be exposed to hepatitis A.  Hepatitis B vaccine. You may need this if you have certain conditions or if you travel or work in places where you may be exposed to hepatitis B.  Haemophilus influenzae type b (Hib) vaccine. You may need this if you have certain risk factors.  Talk to your health care provider about which screenings and vaccines you need and how often you need them. This information is not intended to replace advice given to you by your health care provider. Make sure you discuss any questions you have with your health care provider. Document Released: 08/12/2001 Document Revised: 03/05/2016 Document Reviewed: 04/17/2015 Elsevier Interactive Patient Education  2018 [employer].          No follow-ups on file.  Lucretia Kern, DO

## 2017-11-16 ENCOUNTER — Ambulatory Visit (INDEPENDENT_AMBULATORY_CARE_PROVIDER_SITE_OTHER): Payer: No Typology Code available for payment source | Admitting: Family Medicine

## 2017-11-16 ENCOUNTER — Encounter: Payer: Self-pay | Admitting: Family Medicine

## 2017-11-16 VITALS — BP 102/80 | HR 83 | Temp 98.2°F | Ht 67.75 in | Wt 128.8 lb

## 2017-11-16 DIAGNOSIS — Z0000 Encounter for general adult medical examination without abnormal findings: Secondary | ICD-10-CM

## 2017-11-16 DIAGNOSIS — Z1331 Encounter for screening for depression: Secondary | ICD-10-CM | POA: Diagnosis not present

## 2017-11-16 LAB — HEMOGLOBIN A1C: Hgb A1c MFr Bld: 5.3 % (ref 4.6–6.5)

## 2017-11-16 LAB — LIPID PANEL
Cholesterol: 143 mg/dL (ref 0–200)
HDL: 70.6 mg/dL (ref >39.00)
LDL CALC: 58 mg/dL (ref 0–99)
NONHDL: 71.95
Total CHOL/HDL Ratio: 2
Triglycerides: 69 mg/dL (ref 0.0–149.0)
VLDL: 13.8 mg/dL (ref 0.0–40.0)

## 2017-11-16 NOTE — Patient Instructions (Signed)
BEFORE YOU LEAVE: -labs  -follow up: yearly for CPE  We have ordered labs or studies at this visit. It can take up to 1-2 weeks for results and processing. IF results require follow up or explanation, we will call you with instructions. Clinically stable results will be released to your MYCHART. If you have not heard from us or cannot find your results in MYCHART in 2 weeks please contact our office at [phone].  If you are not yet signed up for MYCHART, please consider signing up.   Preventive Care 18-39 Years, Female Preventive care refers to lifestyle choices and visits with your health care provider that can promote health and wellness. What does preventive care include?  A yearly physical exam. This is also called an annual well check.  Dental exams once or twice a year.  Routine eye exams. Ask your health care provider how often you should have your eyes checked.  Personal lifestyle choices, including: ? Daily care of your teeth and gums. ? Regular physical activity. ? Eating a healthy diet. ? Avoiding tobacco and drug use. ? Limiting alcohol use. ? Practicing safe sex. ? Taking vitamin and mineral supplements as recommended by your health care provider. What happens during an annual well check? The services and screenings done by your health care provider during your annual well check will depend on your age, overall health, lifestyle risk factors, and family history of disease. Counseling Your health care provider may ask you questions about your:  Alcohol use.  Tobacco use.  Drug use.  Emotional well-being.  Home and relationship well-being.  Sexual activity.  Eating habits.  Work and work environment.  Method of birth control.  Menstrual cycle.  Pregnancy history.  Screening You may have the following tests or measurements:  Height, weight, and BMI.  Diabetes screening. This is done by checking your blood sugar (glucose) after you have not eaten  for a while (fasting).  Blood pressure.  Lipid and cholesterol levels. These may be checked every 5 years starting at age 20.  Skin check.  Hepatitis C blood test.  Hepatitis B blood test.  Sexually transmitted disease (STD) testing.  BRCA-related cancer screening. This may be done if you have a family history of breast, ovarian, tubal, or peritoneal cancers.  Pelvic exam and Pap test. This may be done every 3 years starting at age 21. Starting at age 30, this may be done every 5 years if you have a Pap test in combination with an HPV test.  Discuss your test results, treatment options, and if necessary, the need for more tests with your health care provider. Vaccines Your health care provider may recommend certain vaccines, such as:  Influenza vaccine. This is recommended every year.  Tetanus, diphtheria, and acellular pertussis (Tdap, Td) vaccine. You may need a Td booster every 10 years.  Varicella vaccine. You may need this if you have not been vaccinated.  HPV vaccine. If you are 26 or younger, you may need three doses over 6 months.  Measles, mumps, and rubella (MMR) vaccine. You may need at least one dose of MMR. You may also need a second dose.  Pneumococcal 13-valent conjugate (PCV13) vaccine. You may need this if you have certain conditions and were not previously vaccinated.  Pneumococcal polysaccharide (PPSV23) vaccine. You may need one or two doses if you smoke cigarettes or if you have certain conditions.  Meningococcal vaccine. One dose is recommended if you are age 19-21 years and a first-year college   student living in a residence hall, or if you have one of several medical conditions. You may also need additional booster doses.  Hepatitis A vaccine. You may need this if you have certain conditions or if you travel or work in places where you may be exposed to hepatitis A.  Hepatitis B vaccine. You may need this if you have certain conditions or if you travel  or work in places where you may be exposed to hepatitis B.  Haemophilus influenzae type b (Hib) vaccine. You may need this if you have certain risk factors.  Talk to your health care provider about which screenings and vaccines you need and how often you need them. This information is not intended to replace advice given to you by your health care provider. Make sure you discuss any questions you have with your health care provider. Document Released: 08/12/2001 Document Revised: 03/05/2016 Document Reviewed: 04/17/2015 Elsevier Interactive Patient Education  2018 Elsevier Inc.        

## 2018-01-05 ENCOUNTER — Ambulatory Visit (INDEPENDENT_AMBULATORY_CARE_PROVIDER_SITE_OTHER): Payer: No Typology Code available for payment source | Admitting: Obstetrics and Gynecology

## 2018-01-05 ENCOUNTER — Encounter: Payer: Self-pay | Admitting: Obstetrics and Gynecology

## 2018-01-05 VITALS — BP 130/80 | HR 107 | Ht 68.0 in | Wt 131.8 lb

## 2018-01-05 DIAGNOSIS — Z01419 Encounter for gynecological examination (general) (routine) without abnormal findings: Secondary | ICD-10-CM

## 2018-01-05 NOTE — Patient Instructions (Signed)
Preventive Care 18-39 Years, Female Preventive care refers to lifestyle choices and visits with your health care provider that can promote health and wellness. What does preventive care include?  A yearly physical exam. This is also called an annual well check.  Dental exams once or twice a year.  Routine eye exams. Ask your health care provider how often you should have your eyes checked.  Personal lifestyle choices, including: ? Daily care of your teeth and gums. ? Regular physical activity. ? Eating a healthy diet. ? Avoiding tobacco and drug use. ? Limiting alcohol use. ? Practicing safe sex. ? Taking vitamin and mineral supplements as recommended by your health care provider. What happens during an annual well check? The services and screenings done by your health care provider during your annual well check will depend on your age, overall health, lifestyle risk factors, and family history of disease. Counseling Your health care provider may ask you questions about your:  Alcohol use.  Tobacco use.  Drug use.  Emotional well-being.  Home and relationship well-being.  Sexual activity.  Eating habits.  Work and work Statistician.  Method of birth control.  Menstrual cycle.  Pregnancy history.  Screening You may have the following tests or measurements:  Height, weight, and BMI.  Diabetes screening. This is done by checking your blood sugar (glucose) after you have not eaten for a while (fasting).  Blood pressure.  Lipid and cholesterol levels. These may be checked every 5 years starting at age 38.  Skin check.  Hepatitis C blood test.  Hepatitis B blood test.  Sexually transmitted disease (STD) testing.  BRCA-related cancer screening. This may be done if you have a family history of breast, ovarian, tubal, or peritoneal cancers.  Pelvic exam and Pap test. This may be done every 3 years starting at age 38. Starting at age 30, this may be done  every 5 years if you have a Pap test in combination with an HPV test.  Discuss your test results, treatment options, and if necessary, the need for more tests with your health care provider. Vaccines Your health care provider may recommend certain vaccines, such as:  Influenza vaccine. This is recommended every year.  Tetanus, diphtheria, and acellular pertussis (Tdap, Td) vaccine. You may need a Td booster every 10 years.  Varicella vaccine. You may need this if you have not been vaccinated.  HPV vaccine. If you are 39 or younger, you may need three doses over 6 months.  Measles, mumps, and rubella (MMR) vaccine. You may need at least one dose of MMR. You may also need a second dose.  Pneumococcal 13-valent conjugate (PCV13) vaccine. You may need this if you have certain conditions and were not previously vaccinated.  Pneumococcal polysaccharide (PPSV23) vaccine. You may need one or two doses if you smoke cigarettes or if you have certain conditions.  Meningococcal vaccine. One dose is recommended if you are age 68-21 years and a first-year college student living in a residence hall, or if you have one of several medical conditions. You may also need additional booster doses.  Hepatitis A vaccine. You may need this if you have certain conditions or if you travel or work in places where you may be exposed to hepatitis A.  Hepatitis B vaccine. You may need this if you have certain conditions or if you travel or work in places where you may be exposed to hepatitis B.  Haemophilus influenzae type b (Hib) vaccine. You may need this  if you have certain risk factors.  Talk to your health care provider about which screenings and vaccines you need and how often you need them. This information is not intended to replace advice given to you by your health care provider. Make sure you discuss any questions you have with your health care provider. Document Released: 08/12/2001 Document Revised:  03/05/2016 Document Reviewed: 04/17/2015 Elsevier Interactive Patient Education  2018 Elsevier Inc.  

## 2018-01-05 NOTE — Progress Notes (Signed)
  Subjective:     Janet Thompson is a married white  27 y.o. female and is here for a comprehensive physical exam. Is sexually active with female spouse. Is trying for another pregnancy.  Menses are now normal.The patient reports no problems.  Social History   Socioeconomic History  . Marital status: Married    Spouse name: Not on file  . Number of children: Not on file  . Years of education: Not on file  . Highest education level: Not on file  Occupational History  . Not on file  Social Needs  . Financial resource strain: Not on file  . Food insecurity:    Worry: Not on file    Inability: Not on file  . Transportation needs:    Medical: Not on file    Non-medical: Not on file  Tobacco Use  . Smoking status: Never Smoker  . Smokeless tobacco: Never Used  Substance and Sexual Activity  . Alcohol use: No  . Drug use: No  . Sexual activity: Yes    Birth control/protection: None  Lifestyle  . Physical activity:    Days per week: Not on file    Minutes per session: Not on file  . Stress: Not on file  Relationships  . Social connections:    Talks on phone: Not on file    Gets together: Not on file    Attends religious service: Not on file    Active member of club or organization: Not on file    Attends meetings of clubs or organizations: Not on file    Relationship status: Not on file  . Intimate partner violence:    Fear of current or ex partner: Not on file    Emotionally abused: Not on file    Physically abused: Not on file    Forced sexual activity: Not on file  Other Topics Concern  . Not on file  Social History Narrative   Work or School: [employer] Situation: Has 29-year-old son and 2019      Spiritual Beliefs:      Lifestyle: Healthy diet, regular activity   Health Maintenance  Topic Date Due  . INFLUENZA VACCINE  01/28/2018  . PAP SMEAR  12/21/2018  . TETANUS/TDAP  04/10/2025  . HIV Screening  Completed    The following  portions of the patient's history were reviewed and updated as appropriate: allergies, current medications, past family history, past medical history, past social history, past surgical history and problem list.  Review of Systems Pertinent items noted in HPI and remainder of comprehensive ROS otherwise negative.   Objective:    General appearance: alert, cooperative and appears stated age Neck: no adenopathy, no carotid bruit, no JVD, supple, symmetrical, trachea midline and thyroid not enlarged, symmetric, no tenderness/mass/nodules Lungs: clear to auscultation bilaterally Breasts: normal appearance, no masses or tenderness Heart: regular rate and rhythm, S1, S2 normal, no murmur, click, rub or gallop Abdomen: soft, non-tender; bowel sounds normal; no masses,  no organomegaly Pelvic: cervix normal in appearance, external genitalia normal, no adnexal masses or tenderness, no cervical motion tenderness, positive findings: none, rectovaginal septum normal, uterus normal size, shape, and consistency and vagina normal without discharge    Assessment:    Healthy female exam.     Plan:    RTC 1 year or as needed.  Melody Shambley,CNM   See After Visit Summary for Counseling Recommendations

## 2018-03-03 ENCOUNTER — Encounter: Payer: Self-pay | Admitting: Family Medicine

## 2018-03-03 DIAGNOSIS — D229 Melanocytic nevi, unspecified: Secondary | ICD-10-CM

## 2018-03-31 ENCOUNTER — Encounter: Payer: Self-pay | Admitting: Obstetrics and Gynecology

## 2018-03-31 ENCOUNTER — Other Ambulatory Visit: Payer: Self-pay | Admitting: Obstetrics and Gynecology

## 2018-03-31 ENCOUNTER — Other Ambulatory Visit (INDEPENDENT_AMBULATORY_CARE_PROVIDER_SITE_OTHER): Payer: No Typology Code available for payment source

## 2018-03-31 ENCOUNTER — Encounter: Payer: No Typology Code available for payment source | Admitting: Obstetrics and Gynecology

## 2018-03-31 ENCOUNTER — Ambulatory Visit (INDEPENDENT_AMBULATORY_CARE_PROVIDER_SITE_OTHER): Payer: No Typology Code available for payment source | Admitting: Obstetrics and Gynecology

## 2018-03-31 VITALS — BP 123/84 | HR 96 | Ht 68.0 in | Wt 130.5 lb

## 2018-03-31 DIAGNOSIS — Z3201 Encounter for pregnancy test, result positive: Secondary | ICD-10-CM

## 2018-03-31 DIAGNOSIS — O3411 Maternal care for benign tumor of corpus uteri, first trimester: Secondary | ICD-10-CM | POA: Diagnosis not present

## 2018-03-31 DIAGNOSIS — N8312 Corpus luteum cyst of left ovary: Secondary | ICD-10-CM

## 2018-03-31 DIAGNOSIS — Z3A01 Less than 8 weeks gestation of pregnancy: Secondary | ICD-10-CM | POA: Diagnosis not present

## 2018-03-31 DIAGNOSIS — N926 Irregular menstruation, unspecified: Secondary | ICD-10-CM

## 2018-03-31 LAB — POCT URINE PREGNANCY: Preg Test, Ur: POSITIVE — AB

## 2018-03-31 NOTE — Progress Notes (Signed)
  Subjective:     Patient ID: Janet Thompson, female   DOB: 1990-12-17, 27 y.o.   MRN: [id number]  HPI  Here for pregnancy confirmation, reports LMP 02/08/18, giving EDC 11/15/18, & EGA [redacted]w[redacted]d. Review of Systems Moderate nausea with occasional dry heaving. Fatigue.    Objective:   Physical Exam    A&Ox4 Well groomed female in no distress Blood pressure 123/84, pulse 96, height 5\' 8"  (1.727 m), weight 130 lb 8 oz (59.2 kg), last menstrual period 02/08/2018. UPT+ Pelvic exam not indicated. Assessment:     Missed menses    Plan:     Viability scan and NOB labs and nurse intake in 1-2 weeks.

## 2018-04-01 ENCOUNTER — Encounter: Payer: No Typology Code available for payment source | Admitting: Obstetrics and Gynecology

## 2018-04-15 ENCOUNTER — Encounter: Payer: Self-pay | Admitting: Obstetrics and Gynecology

## 2018-04-15 ENCOUNTER — Ambulatory Visit: Payer: No Typology Code available for payment source | Admitting: Obstetrics and Gynecology

## 2018-04-15 ENCOUNTER — Other Ambulatory Visit: Payer: Self-pay

## 2018-04-15 VITALS — BP 116/71 | HR 85 | Ht 68.0 in | Wt 132.2 lb

## 2018-04-15 DIAGNOSIS — Z3481 Encounter for supervision of other normal pregnancy, first trimester: Secondary | ICD-10-CM

## 2018-04-15 NOTE — Progress Notes (Addendum)
Janet Thompson presents for NOB nurse interview visit. Pregnancy confirmation done _10/2/19_____.  G-2.  P- 1   . Pregnancy education material explained and given. __0_ cats in the home. NOB labs ordered. Marland Kitchen HIV labs and Drug screen were explained. Drug screen ordered. PNV encouraged. Genetic screening options discussed. Genetic testing: Ordered.  Pt may discuss with provider. Pt. To follow up with provider in _2_ weeks for NOB physical.Appointment scheduled 04/30/18  All questions answered.

## 2018-04-16 LAB — URINALYSIS, ROUTINE W REFLEX MICROSCOPIC
Bilirubin, UA: NEGATIVE
Glucose, UA: NEGATIVE
Ketones, UA: NEGATIVE
Leukocytes, UA: NEGATIVE
NITRITE UA: NEGATIVE
PH UA: 5.5 (ref 5.0–7.5)
Protein, UA: NEGATIVE
RBC UA: NEGATIVE
Specific Gravity, UA: 1.021 (ref 1.005–1.030)
UUROB: 0.2 mg/dL (ref 0.2–1.0)

## 2018-04-16 LAB — ABO AND RH: Rh Factor: POSITIVE

## 2018-04-17 LAB — HEPATITIS B SURFACE ANTIGEN: Hepatitis B Surface Ag: NEGATIVE

## 2018-04-17 LAB — MONITOR DRUG PROFILE 14(MW)
AMPHETAMINE SCREEN URINE: NEGATIVE ng/mL
BARBITURATE SCREEN URINE: NEGATIVE ng/mL
BENZODIAZEPINE SCREEN, URINE: NEGATIVE ng/mL
Buprenorphine, Urine: NEGATIVE ng/mL
CANNABINOIDS UR QL SCN: NEGATIVE ng/mL
Cocaine (Metab) Scrn, Ur: NEGATIVE ng/mL
Creatinine(Crt), U: 156 mg/dL (ref 20.0–300.0)
FENTANYL, URINE: NEGATIVE pg/mL
MEPERIDINE SCREEN, URINE: NEGATIVE ng/mL
METHADONE SCREEN, URINE: NEGATIVE ng/mL
OXYCODONE+OXYMORPHONE UR QL SCN: NEGATIVE ng/mL
Opiate Scrn, Ur: NEGATIVE ng/mL
PH UR, DRUG SCRN: 6.2 (ref 4.5–8.9)
Phencyclidine Qn, Ur: NEGATIVE ng/mL
Propoxyphene Scrn, Ur: NEGATIVE ng/mL
SPECIFIC GRAVITY: 1.029
TRAMADOL SCREEN, URINE: NEGATIVE ng/mL

## 2018-04-17 LAB — URINE CULTURE: Organism ID, Bacteria: NO GROWTH

## 2018-04-17 LAB — CBC
HEMATOCRIT: 36.9 % (ref 34.0–46.6)
HEMOGLOBIN: 13 g/dL (ref 11.1–15.9)
MCH: 30.9 pg (ref 26.6–33.0)
MCHC: 35.2 g/dL (ref 31.5–35.7)
MCV: 88 fL (ref 79–97)
Platelets: 213 10*3/uL (ref 150–450)
RBC: 4.21 x10E6/uL (ref 3.77–5.28)
RDW: 12.2 % — ABNORMAL LOW (ref 12.3–15.4)
WBC: 7.9 10*3/uL (ref 3.4–10.8)

## 2018-04-17 LAB — VARICELLA ZOSTER ANTIBODY, IGG: Varicella zoster IgG: 431 index (ref >165)

## 2018-04-17 LAB — GC/CHLAMYDIA PROBE AMP
Chlamydia trachomatis, NAA: NEGATIVE
NEISSERIA GONORRHOEAE BY PCR: NEGATIVE

## 2018-04-17 LAB — RPR: RPR Ser Ql: NONREACTIVE

## 2018-04-17 LAB — NICOTINE SCREEN, URINE: COTININE UR QL SCN: NEGATIVE ng/mL

## 2018-04-17 LAB — ANTIBODY SCREEN: Antibody Screen: NEGATIVE

## 2018-04-17 LAB — RUBELLA SCREEN: Rubella Antibodies, IGG: 1.34 index (ref >0.99)

## 2018-04-17 LAB — HIV ANTIBODY (ROUTINE TESTING W REFLEX): HIV Screen 4th Generation wRfx: NONREACTIVE

## 2018-04-17 LAB — HGB SOLU + RFLX FRAC: Sickle Solubility Test - HGBRFX: NEGATIVE

## 2018-04-30 ENCOUNTER — Ambulatory Visit (INDEPENDENT_AMBULATORY_CARE_PROVIDER_SITE_OTHER): Payer: No Typology Code available for payment source | Admitting: Obstetrics and Gynecology

## 2018-04-30 VITALS — BP 112/73 | HR 87 | Wt 130.8 lb

## 2018-04-30 DIAGNOSIS — Z3492 Encounter for supervision of normal pregnancy, unspecified, second trimester: Secondary | ICD-10-CM

## 2018-04-30 LAB — POCT URINALYSIS DIPSTICK OB
BILIRUBIN UA: NEGATIVE
Blood, UA: NEGATIVE
Glucose, UA: NEGATIVE
KETONES UA: NEGATIVE
Leukocytes, UA: NEGATIVE
NITRITE UA: NEGATIVE
PH UA: 6 (ref 5.0–8.0)
POC,PROTEIN,UA: NEGATIVE
SPEC GRAV UA: 1.02 (ref 1.010–1.025)
Urobilinogen, UA: 0.2 E.U./dL

## 2018-04-30 NOTE — Progress Notes (Signed)
NOB PE- pt is having some nausea

## 2018-04-30 NOTE — Progress Notes (Signed)
NEW OB HISTORY AND PHYSICAL  SUBJECTIVE:       Janet Thompson is a 27 y.o. G5P1001 female, Patient's last menstrual period was 02/08/2018., Estimated Date of Delivery: 11/11/18, [redacted]w[redacted]d, presents today for establishment of Prenatal Care. She has no unusual complaints and complains of nausea with vomiting intermittent      Gynecologic History Patient's last menstrual period was 02/08/2018. Normal Contraception: none Last Pap: 2017. Results were: normal  Obstetric History OB History  Gravida Para Term Preterm AB Living  2 1 1  0 0 1  SAB TAB Ectopic Multiple Live Births  0 0 0 0 1    # Outcome Date GA Lbr Len/2nd Weight Sex Delivery Anes PTL Lv  2 Current           1 Term 06/24/15 [redacted]w[redacted]d 10:50 / 01:39 8 lb 4.6 oz (3.76 kg) M Vag-Spont EPI  LIV    Past Medical History:  Diagnosis Date  . Frequent headaches   . History of fainting spells of unknown cause   . Medical history non-contributory   . Menstrual irregularity   . Migraines   . 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 WISDOM TOOTH EXTRACTION      Current Outpatient Medications on File Prior to Visit  Medication Sig Dispense Refill  . Prenatal Vit-Fe Fumarate-FA (PRENATAL MULTIVITAMIN) TABS tablet Take 1 tablet by mouth daily at 12 noon.    . desogestrel-ethinyl estradiol (KARIVA,AZURETTE,MIRCETTE) 0.15-0.02/0.01 MG (21/5) tablet Take 1 tablet by mouth daily.    . fluticasone (FLONASE) 50 MCG/ACT nasal spray Place 1 spray into both nostrils daily.    Marland Kitchen loratadine (CLARITIN) 10 MG tablet Take 10 mg by mouth daily.    . Magnesium 200 MG TABS Take by mouth at bedtime.    . [DISCONTINUED] Drospirenone-Ethinyl Estradiol-Levomefol (BEYAZ) 3-0.02-0.451 MG tablet Take 1 tablet by mouth daily.     No current facility-administered medications on file prior to visit.     No Known Allergies  Social History   Socioeconomic History  . Marital status: Married    Spouse  name: Not on file  . Number of children: Not on file  . Years of education: Not on file  . Highest education level: Not on file  Occupational History  . Not on file  Social Needs  . Financial resource strain: Not on file  . Food insecurity:    Worry: Not on file    Inability: Not on file  . Transportation needs:    Medical: Not on file    Non-medical: Not on file  Tobacco Use  . Smoking status: Never Smoker  . Smokeless tobacco: Never Used  Substance and Sexual Activity  . Alcohol use: No  . Drug use: No  . Sexual activity: Yes    Birth control/protection: None  Lifestyle  . Physical activity:    Days per week: Not on file    Minutes per session: Not on file  . Stress: Not on file  Relationships  . Social connections:    Talks on phone: Not on file    Gets together: Not on file    Attends religious service: Not on file    Active member of club or organization: Not on file    Attends meetings of clubs or organizations: Not on file    Relationship status: Not on file  . Intimate partner violence:    Fear of current or ex partner: Not on  file    Emotionally abused: Not on file    Physically abused: Not on file    Forced sexual activity: Not on file  Other Topics Concern  . Not on file  Social History Narrative   Work or School: [employer] Situation: Has 85-year-old son and 2019      Spiritual Beliefs:      Lifestyle: Healthy diet, regular activity    Family History  Problem Relation Age of Onset  . Cancer Maternal Aunt   . Alcohol abuse Maternal Grandfather        colon  . Diabetes Paternal Grandmother     The following portions of the patient's history were reviewed and updated as appropriate: allergies, current medications, past OB history, past medical history, past surgical history, past family history, past social history, and problem list.    OBJECTIVE: Initial Physical Exam (New OB)  GENERAL APPEARANCE: alert, well  appearing, in no apparent distress, oriented to person, place and time HEAD: normocephalic, atraumatic MOUTH: mucous membranes moist, pharynx normal without lesions and dental hygiene good THYROID: no thyromegaly or masses present BREASTS: not examined LUNGS: clear to auscultation, no wheezes, rales or rhonchi, symmetric air entry HEART: regular rate and rhythm, no murmurs ABDOMEN: soft, nontender, nondistended, no abnormal masses, no epigastric pain, fundus not palpable and FHT present EXTREMITIES: no redness or tenderness in the calves or thighs SKIN: normal coloration and turgor, no rashes LYMPH NODES: no adenopathy palpable NEUROLOGIC: alert, oriented, normal speech, no focal findings or movement disorder noted  PELVIC EXAM deferred  ASSESSMENT: Normal pregnancy  PLAN: Prenatal care See orders

## 2018-04-30 NOTE — Patient Instructions (Signed)
Second Trimester of Pregnancy The second trimester is from week 13 through week 28, month 4 through 6. This is often the time in pregnancy that you feel your best. Often times, morning sickness has lessened or quit. You may have more energy, and you may get hungry more often. Your unborn baby (fetus) is growing rapidly. At the end of the sixth month, he or she is about 9 inches long and weighs about 1 pounds. You will likely feel the baby move (quickening) between 18 and 20 weeks of pregnancy. Follow these instructions at home:  Avoid all smoking, herbs, and alcohol. Avoid drugs not approved by your doctor.  Do not use any tobacco products, including cigarettes, chewing tobacco, and electronic cigarettes. If you need help quitting, ask your doctor. You may get counseling or other support to help you quit.  Only take medicine as told by your doctor. Some medicines are safe and some are not during pregnancy.  Exercise only as told by your doctor. Stop exercising if you start having cramps.  Eat regular, healthy meals.  Wear a good support bra if your breasts are tender.  Do not use hot tubs, steam rooms, or saunas.  Wear your seat belt when driving.  Avoid raw meat, uncooked cheese, and liter boxes and soil used by cats.  Take your prenatal vitamins.  Take 1500-2000 milligrams of calcium daily starting at the 20th week of pregnancy until you deliver your baby.  Try taking medicine that helps you poop (stool softener) as needed, and if your doctor approves. Eat more fiber by eating fresh fruit, vegetables, and whole grains. Drink enough fluids to keep your pee (urine) clear or pale yellow.  Take warm water baths (sitz baths) to soothe pain or discomfort caused by hemorrhoids. Use hemorrhoid cream if your doctor approves.  If you have puffy, bulging veins (varicose veins), wear support hose. Raise (elevate) your feet for 15 minutes, 3-4 times a day. Limit salt in your diet.  Avoid heavy  lifting, wear low heals, and sit up straight.  Rest with your legs raised if you have leg cramps or low back pain.  Visit your dentist if you have not gone during your pregnancy. Use a soft toothbrush to brush your teeth. Be gentle when you floss.  You can have sex (intercourse) unless your doctor tells you not to.  Go to your doctor visits. Get help if:  You feel dizzy.  You have mild cramps or pressure in your lower belly (abdomen).  You have a nagging pain in your belly area.  You continue to feel sick to your stomach (nauseous), throw up (vomit), or have watery poop (diarrhea).  You have bad smelling fluid coming from your vagina.  You have pain with peeing (urination). Get help right away if:  You have a fever.  You are leaking fluid from your vagina.  You have spotting or bleeding from your vagina.  You have severe belly cramping or pain.  You lose or gain weight rapidly.  You have trouble catching your breath and have chest pain.  You notice sudden or extreme puffiness (swelling) of your face, hands, ankles, feet, or legs.  You have not felt the baby move in over an hour.  You have severe headaches that do not go away with medicine.  You have vision changes. This information is not intended to replace advice given to you by your health care provider. Make sure you discuss any questions you have with your health care   provider. Document Released: 09/10/2009 Document Revised: 11/22/2015 Document Reviewed: 08/17/2012 Elsevier Interactive Patient Education  2017 Elsevier Inc.  

## 2018-05-07 ENCOUNTER — Ambulatory Visit
Admission: RE | Admit: 2018-05-07 | Discharge: 2018-05-07 | Disposition: A | Discharge status: Home / Self Care | Payer: No Typology Code available for payment source | Source: Ambulatory Visit | Attending: Obstetrics and Gynecology | Admitting: Obstetrics and Gynecology

## 2018-05-07 ENCOUNTER — Other Ambulatory Visit: Payer: Self-pay | Admitting: Obstetrics and Gynecology

## 2018-05-07 ENCOUNTER — Ambulatory Visit (INDEPENDENT_AMBULATORY_CARE_PROVIDER_SITE_OTHER): Payer: No Typology Code available for payment source | Admitting: Obstetrics and Gynecology

## 2018-05-07 VITALS — BP 132/91 | HR 95 | Wt 132.3 lb

## 2018-05-07 DIAGNOSIS — O4402 Placenta previa specified as without hemorrhage, second trimester: Secondary | ICD-10-CM

## 2018-05-07 DIAGNOSIS — Z3492 Encounter for supervision of normal pregnancy, unspecified, second trimester: Secondary | ICD-10-CM

## 2018-05-07 DIAGNOSIS — O4692 Antepartum hemorrhage, unspecified, second trimester: Secondary | ICD-10-CM

## 2018-05-07 HISTORY — DX: Complete placenta previa nos or without hemorrhage, second trimester: O44.02

## 2018-05-07 NOTE — Telephone Encounter (Signed)
The patient called back at 11:16 today and LVM that her spotting has increased and is more red in color with some abdominal cramping for 20 minutes in duration.  She is asking to speak to her nurse, please advise, thanks.

## 2018-05-07 NOTE — Progress Notes (Signed)
OB WORK IN- started last night, lower abd cramping, has seen some dark old blood

## 2018-05-07 NOTE — Telephone Encounter (Signed)
The patient called this morning and asked to speak to her provider/Amy about her symptoms.  Please advise, thanks.

## 2018-05-07 NOTE — Progress Notes (Signed)
Work in [employer]- reports going to have BM last night and noticed dark brown spotting, went to bed and noticed more this am and throught the day. It is still dark brown and scant. Lower pelvic pressure. Has had a headache also for days, very anxious that something is wrong, no sex in 3 weeks.  Pelvic exam: normal external genitalia, vulva, vagina, cervix, uterus and adnexa, dark blood cervical discharge noted with closed cervix.FH c/w 13 weeks and FHT 166  Pelvic u/s at [facility] reveals the following:    CLINICAL DATA: Vaginal bleeding.  EXAM: OBSTETRIC <14 WK [place] AND TRANSVAGINAL OB US  TECHNIQUE: Both transabdominal and transvaginal ultrasound examinations were performed for complete evaluation of the gestation as well as the maternal uterus, adnexal regions, and pelvic cul-de-sac. Transvaginal technique was performed to assess early pregnancy.  COMPARISON: None.  FINDINGS: Intrauterine gestational sac: Single  Yolk sac: Not Visualized.  Embryo: Visualized.  Cardiac Activity: Visualized.  Heart Rate: 163 bpm  CRL: 72.9 mm  13 w  3 d         [place] EDC: 11/09/2018  Subchorionic hemorrhage: None visualized.  Maternal uterus/adnexae: Small left ovarian cyst measuring 2.5 x 1.3 x 2 cm without a mural nodule or septation. Otherwise normal bilateral ovaries. Anterior placenta with placenta previa. Small amount of blood products in the cervical os.  IMPRESSION: 1. Single live intrauterine pregnancy as detailed above. 2. Anterior placenta with placenta previa. Recommend close clinical and sonographic follow-up on subsequent exams. Small amount of blood in the cervical os.   Reviewed findings with patient over the phone. To be on pelvic rest until previa resolves. Will let us know if bleeding persist or worsens. To come in to ED if heavy bleeding noted after hours.

## 2018-05-31 ENCOUNTER — Ambulatory Visit (INDEPENDENT_AMBULATORY_CARE_PROVIDER_SITE_OTHER): Payer: No Typology Code available for payment source | Admitting: Certified Nurse Midwife

## 2018-05-31 ENCOUNTER — Encounter: Payer: No Typology Code available for payment source | Admitting: Certified Nurse Midwife

## 2018-05-31 ENCOUNTER — Encounter: Payer: Self-pay | Admitting: Certified Nurse Midwife

## 2018-05-31 VITALS — BP 104/70 | HR 78 | Wt 134.5 lb

## 2018-05-31 DIAGNOSIS — O99612 Diseases of the digestive system complicating pregnancy, second trimester: Secondary | ICD-10-CM

## 2018-05-31 DIAGNOSIS — Z3492 Encounter for supervision of normal pregnancy, unspecified, second trimester: Secondary | ICD-10-CM

## 2018-05-31 DIAGNOSIS — O4402 Placenta previa specified as without hemorrhage, second trimester: Secondary | ICD-10-CM

## 2018-05-31 DIAGNOSIS — K5900 Constipation, unspecified: Secondary | ICD-10-CM

## 2018-05-31 DIAGNOSIS — O1212 Gestational proteinuria, second trimester: Secondary | ICD-10-CM

## 2018-05-31 LAB — POCT URINALYSIS DIPSTICK OB
BILIRUBIN UA: NEGATIVE
Blood, UA: NEGATIVE
GLUCOSE, UA: NEGATIVE
KETONES UA: NEGATIVE
Nitrite, UA: NEGATIVE
SPEC GRAV UA: 1.015 (ref 1.010–1.025)
Urobilinogen, UA: 0.2 E.U./dL
pH, UA: 6.5 (ref 5.0–8.0)

## 2018-05-31 MED ORDER — POLYETHYLENE GLYCOL 3350 17 GM/SCOOP PO POWD: 1.0000 | Freq: Once | ORAL | 0 refills | Status: AC

## 2018-05-31 MED ORDER — DOCUSATE SODIUM 100 MG PO CAPS: 100.0000 mg | ORAL_CAPSULE | Freq: Two times a day (BID) | ORAL | 0 refills | Status: DC

## 2018-05-31 NOTE — Progress Notes (Signed)
ROB-Questions regarding placenta previa diagnosis answered. Reviewed precautions. Encouraged bowel regimen to prevent constipation, see orders. Anticipatory guidance regarding course of prenatal care. Reviewed red flag symptoms and when to call. RTC x 4-5 weeks for ANATOMY SCAN and ROB or sooner if needed.

## 2018-05-31 NOTE — Patient Instructions (Addendum)
Second Trimester of Pregnancy The second trimester is from week 13 through week 28, month 4 through 6. This is often the time in pregnancy that you feel your best. Often times, morning sickness has lessened or quit. You may have more energy, and you may get hungry more often. Your unborn baby (fetus) is growing rapidly. At the end of the sixth month, he or she is about 9 inches long and weighs about 1 pounds. You will likely feel the baby move (quickening) between 18 and 20 weeks of pregnancy. Follow these instructions at home:  Avoid all smoking, herbs, and alcohol. Avoid drugs not approved by your doctor.  Do not use any tobacco products, including cigarettes, chewing tobacco, and electronic cigarettes. If you need help quitting, ask your doctor. You may get counseling or other support to help you quit.  Only take medicine as told by your doctor. Some medicines are safe and some are not during pregnancy.  Exercise only as told by your doctor. Stop exercising if you start having cramps.  Eat regular, healthy meals.  Wear a good support bra if your breasts are tender.  Do not use hot tubs, steam rooms, or saunas.  Wear your seat belt when driving.  Avoid raw meat, uncooked cheese, and liter boxes and soil used by cats.  Take your prenatal vitamins.  Take 1500-2000 milligrams of calcium daily starting at the 20th week of pregnancy until you deliver your baby.  Try taking medicine that helps you poop (stool softener) as needed, and if your doctor approves. Eat more fiber by eating fresh fruit, vegetables, and whole grains. Drink enough fluids to keep your pee (urine) clear or pale yellow.  Take warm water baths (sitz baths) to soothe pain or discomfort caused by hemorrhoids. Use hemorrhoid cream if your doctor approves.  If you have puffy, bulging veins (varicose veins), wear support hose. Raise (elevate) your feet for 15 minutes, 3-4 times a day. Limit salt in your diet.  Avoid  heavy lifting, wear low heals, and sit up straight.  Rest with your legs raised if you have leg cramps or low back pain.  Visit your dentist if you have not gone during your pregnancy. Use a soft toothbrush to brush your teeth. Be gentle when you floss.  You can have sex (intercourse) unless your doctor tells you not to.  Go to your doctor visits. Get help if:  You feel dizzy.  You have mild cramps or pressure in your lower belly (abdomen).  You have a nagging pain in your belly area.  You continue to feel sick to your stomach (nauseous), throw up (vomit), or have watery poop (diarrhea).  You have bad smelling fluid coming from your vagina.  You have pain with peeing (urination). Get help right away if:  You have a fever.  You are leaking fluid from your vagina.  You have spotting or bleeding from your vagina.  You have severe belly cramping or pain.  You lose or gain weight rapidly.  You have trouble catching your breath and have chest pain.  You notice sudden or extreme puffiness (swelling) of your face, hands, ankles, feet, or legs.  You have not felt the baby move in over an hour.  You have severe headaches that do not go away with medicine.  You have vision changes. This information is not intended to replace advice given to you by your health care provider. Make sure you discuss any questions you have with your  health care provider. Document Released: 09/10/2009 Document Revised: 11/22/2015 Document Reviewed: 08/17/2012 Elsevier Interactive Patient Education  2017 Elsevier Inc. WHAT OB PATIENTS CAN EXPECT   Confirmation of pregnancy and ultrasound ordered if medically indicated-[redacted] weeks gestation  New OB (NOB) intake with nurse and New OB (NOB) labs- [redacted] weeks gestation  New OB (NOB) physical examination with provider- 11/[redacted] weeks gestation  Flu vaccine-[redacted] weeks gestation  Anatomy scan-[redacted] weeks gestation  Glucose tolerance test, blood work to test for  anemia, T-dap vaccine-[redacted] weeks gestation  Vaginal swabs/cultures-STD/Group B strep-[redacted] weeks gestation  Appointments every 4 weeks until 28 weeks  Every 2 weeks from 28 weeks until 36 weeks  Weekly visits from 45 weeks until delivery    Placenta Previa Placenta previa is a condition in which the placenta implants in the lower part of the uterus in pregnant women. The placenta either partially or completely covers the opening to the cervix. This is a problem because the baby must pass through the cervix during delivery. There are three types of placenta previa:  Marginal placenta previa. The placenta reaches within an inch (2.5 cm) of the cervical opening but does not cover it.  Partial placenta previa. The placenta covers part of the cervical opening.  Complete placenta previa. The placenta covers the entire cervical opening.  If the previa is marginal or partial and it is diagnosed in the first half of pregnancy, the placenta may move into a normal position as the pregnancy progresses and may no longer cover the cervix. It is important to keep all prenatal visits with your health care provider so you can be more closely monitored. What are the causes? The cause of this condition is not known. What increases the risk? This condition is more likely to develop in women who:  Are carrying more than one baby (multiples).  Have an abnormally shaped uterus.  Have scars on the lining of the uterus.  Have had surgeries involving the uterus, such as a cesarean delivery.  Have delivered a baby before.  Have a history of placenta previa.  Have smoked or used cocaine during pregnancy.  Are age 56 or older during pregnancy.  What are the signs or symptoms? The main symptom of this condition is sudden, painless vaginal bleeding during the second half of pregnancy. The amount of bleeding can be very light at first, and it usually stops on its own. Heavier bleeding episodes may also  happen. Some women with placenta previa may have no bleeding at all. How is this diagnosed?  This condition is diagnosed: ? From an ultrasound. This test uses sound waves to find where the placenta is located before you have any bleeding episodes. ? During a checkup after vaginal bleeding is noticed.  If you are diagnosed with a partial or complete previa, digital exams with fingers will generally be avoided. Your health care provider will still perform a speculum exam.  If you did not have an ultrasound during your pregnancy, placenta previa may not be diagnosed until bleeding occurs during labor. How is this treated? Treatment for this condition may include:  Decreased activity.  Bed rest at home or in the hospital.  Pelvic rest. Nothing is placed inside the vagina during pelvic rest. This means not having sex and not using tampons or douches.  A blood transfusion to replace blood that you have lost (maternal blood loss).  A cesarean delivery. This may be performed if: ? The bleeding is heavy and cannot be controlled. ? The  placenta completely covers the cervix.  Medicines to stop premature labor or to help the baby's lungs to mature. This treatment may be used if you need delivery before your pregnancy is full-term.  Your treatment will be decided based on:  How much you are bleeding, or whether the bleeding has stopped.  How far along you are in your pregnancy.  The condition of your baby.  The type of placenta previa that you have.  Follow these instructions at home:  Get plenty of rest and lessen activity as told by your health care provider.  Stay on bed rest for as long as told by your health care provider.  Do not have sex, use tampons, use a douche, or place anything inside of your vagina if your health care provider recommended pelvic rest.  Take over-the-counter and prescription medicines as told by your health care provider.  Keep all follow-up visits as  told by your health care provider. This is important. Get help right away if:  You have vaginal bleeding, even if in small amounts and even if you have no pain.  You have cramping or regular contractions.  You have pain in your abdomen or your lower back.  You have a feeling of increased pressure in your pelvis.  You have increased watery or bloody mucus from the vagina. This information is not intended to replace advice given to you by your health care provider. Make sure you discuss any questions you have with your health care provider. Document Released: 06/16/2005 Document Revised: 03/05/2016 Document Reviewed: 12/29/2015 Elsevier Interactive Patient Education  2018 [employer].  Constipation, Adult Constipation is when a person:  Poops (has a bowel movement) fewer times in a week than normal.  Has a hard time pooping.  Has poop that is dry, hard, or bigger than normal.  Follow these instructions at home: Eating and drinking   Eat foods that have a lot of fiber, such as: ? Fresh fruits and vegetables. ? Whole grains. ? Beans.  Eat less of foods that are high in fat, low in fiber, or overly processed, such as: ? [place] fries. ? Hamburgers. ? Cookies. ? Candy. ? Soda.  Drink enough fluid to keep your pee (urine) clear or pale yellow. General instructions  Exercise regularly or as told by your doctor.  Go to the restroom when you feel like you need to poop. Do not hold it in.  Take over-the-counter and prescription medicines only as told by your doctor. These include any fiber supplements.  Do pelvic floor retraining exercises, such as: ? Doing deep breathing while relaxing your lower belly (abdomen). ? Relaxing your pelvic floor while pooping.  Watch your condition for any changes.  Keep all follow-up visits as told by your doctor. This is important. Contact a doctor if:  You have pain that gets worse.  You have a fever.  You have not pooped for 4  days.  You throw up (vomit).  You are not hungry.  You lose weight.  You are bleeding from the anus.  You have thin, pencil-like poop (stool). Get help right away if:  You have a fever, and your symptoms suddenly get worse.  You leak poop or have blood in your poop.  Your belly feels hard or bigger than normal (is bloated).  You have very bad belly pain.  You feel dizzy or you faint. This information is not intended to replace advice given to you by your health care provider. Make sure you  discuss any questions you have with your health care provider. Document Released: 12/03/2007 Document Revised: 01/04/2016 Document Reviewed: 12/05/2015 Elsevier Interactive Patient Education  2018 [employer].

## 2018-05-31 NOTE — Progress Notes (Signed)
Janet Thompson, patient was diagnosed with placenta previa and she has questions for provider.

## 2018-06-29 ENCOUNTER — Ambulatory Visit (INDEPENDENT_AMBULATORY_CARE_PROVIDER_SITE_OTHER): Payer: No Typology Code available for payment source

## 2018-06-29 ENCOUNTER — Encounter: Payer: Self-pay | Admitting: Certified Nurse Midwife

## 2018-06-29 ENCOUNTER — Ambulatory Visit (INDEPENDENT_AMBULATORY_CARE_PROVIDER_SITE_OTHER): Payer: No Typology Code available for payment source | Admitting: Certified Nurse Midwife

## 2018-06-29 ENCOUNTER — Other Ambulatory Visit: Payer: Self-pay | Admitting: Obstetrics and Gynecology

## 2018-06-29 VITALS — BP 112/60 | HR 91 | Wt 136.1 lb

## 2018-06-29 DIAGNOSIS — O4402 Placenta previa specified as without hemorrhage, second trimester: Secondary | ICD-10-CM

## 2018-06-29 DIAGNOSIS — Z363 Encounter for antenatal screening for malformations: Secondary | ICD-10-CM

## 2018-06-29 DIAGNOSIS — Z3A20 20 weeks gestation of pregnancy: Secondary | ICD-10-CM | POA: Diagnosis not present

## 2018-06-29 DIAGNOSIS — Z3492 Encounter for supervision of normal pregnancy, unspecified, second trimester: Secondary | ICD-10-CM

## 2018-06-29 LAB — POCT URINALYSIS DIPSTICK OB
BILIRUBIN UA: NEGATIVE
Blood, UA: NEGATIVE
Glucose, UA: NEGATIVE
Ketones, UA: NEGATIVE
Leukocytes, UA: NEGATIVE
Nitrite, UA: NEGATIVE
PH UA: 5 (ref 5.0–8.0)
PROTEIN: NEGATIVE
Spec Grav, UA: >=1.03 — AB (ref 1.010–1.025)
Urobilinogen, UA: 0.2 E.U./dL

## 2018-06-29 NOTE — Patient Instructions (Signed)

## 2018-06-29 NOTE — Progress Notes (Signed)
ROB doing well. Feels good movement. Anatomy u/s completed today ( see below). WNL, resolution of previa. Follow up 4 wks.   Philip Aspen, CNM    Patient Name: Janet Thompson DOB: 04/01/1991 MRN: [id number]  ULTRASOUND REPORT  Location: [facility] Date of Service: 06/29/2018   Indications:Anatomy Ultrasound Findings:  Janet Thompson intrauterine pregnancy is visualized with FHR at 133 BPM. Biometrics give an (U/S) Gestational age of [redacted]w[redacted]d and an (U/S) EDD of 11/11/18; this correlates with the clinically established Estimated Date of Delivery: 11/11/18  Fetal presentation is Breech.   EFW: wnl. Placenta: anterior. Grade: 0. Lower edge shows circumvallate appearance with placental lake  = 8.56mm AFI: subjectively normal.  Anatomic survey is complete and normal; Gender - female.    Right Ovary is normal in appearance. Left Ovary is normal appearance. Survey of the adnexa demonstrates no adnexal masses. There is no free peritoneal fluid in the cul de sac.  Impression: 1. [redacted]w[redacted]d Viable Singleton Intrauterine pregnancy by U/S. 2. (U/S) EDD is consistent with Clinically established Estimated Date of Delivery: 11/11/18 . 3. Normal Anatomy Scan 4.  Lower edge shows circumvallate appearance with placental lake  = 8.29mm  Recommendations: 1.Clinical correlation with the patient's History and Physical Exam.   Abeer Alsammarraie,RDMS

## 2018-06-30 NOTE — L&D Delivery Note (Signed)
     Delivery Note   [name] is a 28 y.o. [id number] at [redacted]w[redacted]d Estimated Date of Delivery: 11/11/18  PRE-OPERATIVE DIAGNOSIS:  1) [redacted]w[redacted]d pregnancy.   POST-OPERATIVE DIAGNOSIS:  1) [redacted]w[redacted]d pregnancy s/p Vaginal, Spontaneous   Delivery Type: Vaginal, Spontaneous    Delivery Anesthesia: General   Labor Complications:   none     ESTIMATED BLOOD LOSS: 350 ml    FINDINGS:   1) female infant, Apgar scores of 8    at 1 minute and 9    at 5 minutes and a birthweight of    ounces.    2) Nuchal cord:  Yes x 1 reduced   SPECIMENS:   PLACENTA:   Appearance: Intact , 3 vessel cord   Removal: Spontaneous      Disposition:   Held per protocol then discarded   DISPOSITION:  Infant to left in stable condition in the delivery room, with L&D personnel and mother,  NARRATIVE SUMMARY: Labor course:  [name] is a [id number] at [redacted]w[redacted]d who presented for induction of labor.  She progressed well in labor with pitocin.  She received the appropriate anesthesia and proceeded to complete dilation. She evidenced good maternal expulsive effort during the second stage. Head delivered in LOA, nuchal cord x 1 reduced. Shoulder delivered with ease. She went on to deliver a viable female infant "Janet Thompson". The placenta delivered without problems and was noted to be complete. A perineal and vaginal examination was performed.  No lacerations, periurethra skid mark hemostatic not in need of repair. The patient tolerated this well. Vaginal vault check completed x 2 . Counts correct.   Philip Aspen, CNM  11/04/2018 2:33 PM

## 2018-07-27 ENCOUNTER — Ambulatory Visit (INDEPENDENT_AMBULATORY_CARE_PROVIDER_SITE_OTHER): Payer: No Typology Code available for payment source | Admitting: Obstetrics and Gynecology

## 2018-07-27 VITALS — BP 120/70 | HR 110 | Wt 140.0 lb

## 2018-07-27 DIAGNOSIS — O4402 Placenta previa specified as without hemorrhage, second trimester: Secondary | ICD-10-CM

## 2018-07-27 DIAGNOSIS — Z3492 Encounter for supervision of normal pregnancy, unspecified, second trimester: Secondary | ICD-10-CM

## 2018-07-27 LAB — POCT URINALYSIS DIPSTICK OB
BILIRUBIN UA: NEGATIVE
Glucose, UA: NEGATIVE
Ketones, UA: NEGATIVE
Leukocytes, UA: NEGATIVE
Nitrite, UA: NEGATIVE
PH UA: 6 (ref 5.0–8.0)
POC,PROTEIN,UA: NEGATIVE
RBC UA: NEGATIVE
Spec Grav, UA: 1.01 (ref 1.010–1.025)
UROBILINOGEN UA: 0.2 U/dL

## 2018-07-27 NOTE — Progress Notes (Signed)
ROB- reports feeling more low back pain and nerve pains, increased pressure.  Feeling tired. glucola and repeat ultrasound next visit.

## 2018-07-27 NOTE — Progress Notes (Signed)
ROB- pt is doing well 

## 2018-08-16 ENCOUNTER — Telehealth: Payer: No Typology Code available for payment source | Admitting: Physician Assistant

## 2018-08-16 ENCOUNTER — Encounter: Payer: Self-pay | Admitting: Physician Assistant

## 2018-08-16 DIAGNOSIS — J029 Acute pharyngitis, unspecified: Secondary | ICD-10-CM

## 2018-08-16 NOTE — Progress Notes (Signed)
Based on what you shared with me it looks like you have a serious condition that should be evaluated in a face to face office visit.   Janet Thompson, since you are currently pregnant, I would rather be on the safe side than prescribe antibiotics, especially since there is no known exposure to Strep. As you may know, we would be more cautious in pregnancy, so, I don't believe there is a role for antibiotics. If you symptoms persist, worsen, or if you develop any new symptoms, please follow up with your primary care provider or OBGYN. Hope you feel better soon! For your convenience, I will drop the charges for this evisit.    NOTE: If you entered your credit card information for this eVisit, you will not be charged. You may see a "hold" on your card for the $30 but that hold will drop off and you will not have a charge processed.  If you are having a true medical emergency please call 911.  If you need an urgent face to face visit, [facility] has four urgent care centers for your convenience.  If you need care fast and have a high deductible or no insurance consider:   [email or web address] to reserve your spot online an avoid wait times  [facility] [address], [address] [place], [place] [address] 8 am to 8 pm Monday-Friday 10 am to 4 pm Saturday-Sunday *Across the street from [employer]  [place], [address] 8 am to 5 pm Monday-Friday * In the [facility] on the [facility]   The following sites will take your  insurance:  . [facility] [place] a Provider at this Location  [address] [place], [place] [address] . 10 am to 8 pm Monday-Friday . 12 pm to 8 pm Saturday-Sunday   . [facility] at [place] a Provider at this Location  [address], [place], [place]  [address] . 8 am to 8 pm Monday-Friday . 9 am to 6 pm Saturday . 11 am to 6 pm Sunday   . [facility] at [place] Get Driving Directions  [address] [place],  [address] . 8 am to 8 pm Monday-Friday . 8 am to 4 pm Saturday-Sunday   Your e-visit answers were reviewed by a board certified advanced clinical practitioner to complete your personal care plan.  Thank you for using e-Visits.

## 2018-08-24 ENCOUNTER — Other Ambulatory Visit (INDEPENDENT_AMBULATORY_CARE_PROVIDER_SITE_OTHER): Payer: No Typology Code available for payment source

## 2018-08-24 ENCOUNTER — Other Ambulatory Visit: Payer: No Typology Code available for payment source

## 2018-08-24 ENCOUNTER — Ambulatory Visit (INDEPENDENT_AMBULATORY_CARE_PROVIDER_SITE_OTHER): Payer: No Typology Code available for payment source | Admitting: Certified Nurse Midwife

## 2018-08-24 VITALS — BP 107/72 | HR 104 | Wt 147.2 lb

## 2018-08-24 DIAGNOSIS — Z3A28 28 weeks gestation of pregnancy: Secondary | ICD-10-CM | POA: Diagnosis not present

## 2018-08-24 DIAGNOSIS — Z131 Encounter for screening for diabetes mellitus: Secondary | ICD-10-CM

## 2018-08-24 DIAGNOSIS — Z130 Encounter for screening for diseases of the blood and blood-forming organs and certain disorders involving the immune mechanism: Secondary | ICD-10-CM

## 2018-08-24 DIAGNOSIS — O4402 Placenta previa specified as without hemorrhage, second trimester: Secondary | ICD-10-CM

## 2018-08-24 DIAGNOSIS — O4403 Placenta previa specified as without hemorrhage, third trimester: Secondary | ICD-10-CM | POA: Diagnosis not present

## 2018-08-24 DIAGNOSIS — Z3493 Encounter for supervision of normal pregnancy, unspecified, third trimester: Secondary | ICD-10-CM

## 2018-08-24 LAB — POCT URINALYSIS DIPSTICK OB
Bilirubin, UA: NEGATIVE
GLUCOSE, UA: NEGATIVE
Ketones, UA: NEGATIVE
LEUKOCYTES UA: NEGATIVE
NITRITE UA: NEGATIVE
POC,PROTEIN,UA: NEGATIVE
RBC UA: NEGATIVE
Spec Grav, UA: 1.01 (ref 1.010–1.025)
Urobilinogen, UA: 0.2 E.U./dL
pH, UA: 5 (ref 5.0–8.0)

## 2018-08-24 MED ORDER — MAGNESIUM OXIDE 400 (241.3 MG) MG PO TABS: 400.0000 mg | ORAL_TABLET | Freq: Every day | ORAL | 1 refills | Status: DC

## 2018-08-24 MED ORDER — TETANUS-DIPHTH-ACELL PERTUSSIS 5-2.5-18.5 LF-MCG/0.5 IM SUSP
0.5000 mL | Freq: Once | INTRAMUSCULAR | Status: AC
  Administered 2018-08-24: 0.5 mL via INTRAMUSCULAR

## 2018-08-24 MED ORDER — PANTOPRAZOLE SODIUM 20 MG PO TBEC: 20.0000 mg | DELAYED_RELEASE_TABLET | Freq: Every day | ORAL | 2 refills | Status: DC

## 2018-08-24 MED FILL — PANTOPRAZOLE SOD DR 20 MG T: 20 | 30 days supply | Qty: 30 | Fill #0 | Status: TO

## 2018-08-24 NOTE — Patient Instructions (Addendum)
Third Trimester of Pregnancy  The third trimester is from week 28 through week 40 (months 7 through 9). This trimester is when your unborn baby (fetus) is growing very fast. At the end of the ninth month, the unborn baby is about 20 inches in length. It weighs about 6-10 pounds. Follow these instructions at home: Medicines  Take over-the-counter and prescription medicines only as told by your doctor. Some medicines are safe and some medicines are not safe during pregnancy.  Take a prenatal vitamin that contains at least 600 micrograms (mcg) of folic acid.  If you have trouble pooping (constipation), take medicine that will make your stool soft (stool softener) if your doctor approves. Eating and drinking   Eat regular, healthy meals.  Avoid raw meat and uncooked cheese.  If you get low calcium from the food you eat, talk to your doctor about taking a daily calcium supplement.  Eat four or five small meals rather than three large meals a day.  Avoid foods that are high in fat and sugars, such as fried and sweet foods.  To prevent constipation: ? Eat foods that are high in fiber, like fresh fruits and vegetables, whole grains, and beans. ? Drink enough fluids to keep your pee (urine) clear or pale yellow. Activity  Exercise only as told by your doctor. Stop exercising if you start to have cramps.  Avoid heavy lifting, wear low heels, and sit up straight.  Do not exercise if it is too hot, too humid, or if you are in a place of great height (high altitude).  You may continue to have sex unless your doctor tells you not to. Relieving pain and discomfort  Wear a good support bra if your breasts are tender.  Take frequent breaks and rest with your legs raised if you have leg cramps or low back pain.  Take warm water baths (sitz baths) to soothe pain or discomfort caused by hemorrhoids. Use hemorrhoid cream if your doctor approves.  If you develop puffy, bulging veins (varicose  veins) in your legs: ? Wear support hose or compression stockings as told by your doctor. ? Raise (elevate) your feet for 15 minutes, 3-4 times a day. ? Limit salt in your food. Safety  Wear your seat belt when driving.  Make a list of emergency phone numbers, including numbers for family, friends, the hospital, and police and fire departments. Preparing for your baby's arrival To prepare for the arrival of your baby:  Take prenatal classes.  Practice driving to the hospital.  Visit the hospital and tour the maternity area.  Talk to your work about taking leave once the baby comes.  Pack your hospital bag.  Prepare the baby's room.  Go to your doctor visits.  Buy a rear-facing car seat. Learn how to install it in your car. General instructions  Do not use hot tubs, steam rooms, or saunas.  Do not use any products that contain nicotine or tobacco, such as cigarettes and e-cigarettes. If you need help quitting, ask your doctor.  Do not drink alcohol.  Do not douche or use tampons or scented sanitary pads.  Do not cross your legs for long periods of time.  Do not travel for long distances unless you must. Only do so if your doctor says it is okay.  Visit your dentist if you have not gone during your pregnancy. Use a soft toothbrush to brush your teeth. Be gentle when you floss.  Avoid cat litter boxes and soil   used by cats. These carry germs that can cause birth defects in the baby and can cause a loss of your baby (miscarriage) or stillbirth.  Keep all your prenatal visits as told by your doctor. This is important. Contact a doctor if:  You are not sure if you are in labor or if your water has broken.  You are dizzy.  You have mild cramps or pressure in your lower belly.  You have a nagging pain in your belly area.  You continue to feel sick to your stomach, you throw up, or you have watery poop.  You have bad smelling fluid coming from your vagina.  You have  pain when you pee. Get help right away if:  You have a fever.  You are leaking fluid from your vagina.  You are spotting or bleeding from your vagina.  You have severe belly cramps or pain.  You lose or gain weight quickly.  You have trouble catching your breath and have chest pain.  You notice sudden or extreme puffiness (swelling) of your face, hands, ankles, feet, or legs.  You have not felt the baby move in over an hour.  You have severe headaches that do not go away with medicine.  You have trouble seeing.  You are leaking, or you are having a gush of fluid, from your vagina before you are 37 weeks.  You have regular belly spasms (contractions) before you are 37 weeks. Summary  The third trimester is from week 28 through week 40 (months 7 through 9). This time is when your unborn baby is growing very fast.  Follow your doctor's advice about medicine, food, and activity.  Get ready for the arrival of your baby by taking prenatal classes, getting all the baby items ready, preparing the baby's room, and visiting your doctor to be checked.  Get help right away if you are bleeding from your vagina, or you have chest pain and trouble catching your breath, or if you have not felt your baby move in over an hour. This information is not intended to replace advice given to you by your health care provider. Make sure you discuss any questions you have with your health care provider. Document Released: 09/10/2009 Document Revised: 07/22/2016 Document Reviewed: 07/22/2016 Elsevier Interactive Patient Education  2019 Elsevier Inc. Pantoprazole tablets What is this medicine? PANTOPRAZOLE (pan TOE pra zole) prevents the production of acid in the stomach. It is used to treat gastroesophageal reflux disease (GERD), inflammation of the esophagus, and Zollinger-Ellison syndrome. This medicine may be used for other purposes; ask your health care provider or pharmacist if you have  questions. COMMON BRAND NAME(S): Protonix What should I tell my health care provider before I take this medicine? They need to know if you have any of these conditions: -liver disease -low levels of magnesium in the blood -lupus -an unusual or allergic reaction to omeprazole, lansoprazole, pantoprazole, rabeprazole, other medicines, foods, dyes, or preservatives -pregnant or trying to get pregnant -breast-feeding How should I use this medicine? Take this medicine by mouth. Swallow the tablets whole with a drink of water. Follow the directions on the prescription label. Do not crush, break, or chew. Take your medicine at regular intervals. Do not take your medicine more often than directed. Talk to your pediatrician regarding the use of this medicine in children. While this drug may be prescribed for children as young as 5 years for selected conditions, precautions do apply. Overdosage: If you think you have taken too  much of this medicine contact a poison control center or emergency room at once. NOTE: This medicine is only for you. Do not share this medicine with others. What if I miss a dose? If you miss a dose, take it as soon as you can. If it is almost time for your next dose, take only that dose. Do not take double or extra doses. What may interact with this medicine? Do not take this medicine with any of the following medications: -atazanavir -nelfinavir This medicine may also interact with the following medications: -ampicillin -delavirdine -erlotinib -iron salts -medicines for fungal infections like ketoconazole, itraconazole and voriconazole -methotrexate -mycophenolate mofetil -warfarin This list may not describe all possible interactions. Give your health care provider a list of all the medicines, herbs, non-prescription drugs, or dietary supplements you use. Also tell them if you smoke, drink alcohol, or use illegal drugs. Some items may interact with your medicine. What  should I watch for while using this medicine? It can take several days before your stomach pain gets better. Check with your doctor or health care professional if your condition does not start to get better, or if it gets worse. You may need blood work done while you are taking this medicine. This medicine may cause a decrease in vitamin B12. You should make sure that you get enough vitamin B12 while you are taking this medicine. Discuss the foods you eat and the vitamins you take with your health care professional. What side effects may I notice from receiving this medicine? Side effects that you should report to your doctor or health care professional as soon as possible: - allergic reactions like skin rash, itching or hives, swelling of the face, lips, or tongue - bone, muscle or joint pain - breathing problems - chest pain or chest tightness - dark yellow or brown urine - dizziness - fast, irregular heartbeat - feeling faint or lightheaded - fever or sore throat - muscle spasm - palpitations - rash on cheeks or arms that gets worse in the sun - redness, blistering, peeling or loosening of the skin, including inside the mouth - seizures -stomach polyps - tremors - unusual bleeding or bruising - unusually weak or tired - yellowing of the eyes or skin Side effects that usually do not require medical attention (report to your doctor or health care professional if they continue or are bothersome): - constipation - diarrhea - dry mouth - headache - nausea This list may not describe all possible side effects. Call your doctor for medical advice about side effects. You may report side effects to FDA at 1-800-FDA-1088. Where should I keep my medicine? Keep out of the reach of children. Store at room temperature between 15 and 30 degrees C (59 and 86 degrees F). Protect from light and moisture. Throw away any unused medicine after the expiration date. NOTE: This  sheet is a summary. It may not cover all possible information. If you have questions about this medicine, talk to your doctor, pharmacist, or health care provider.  2019 Elsevier/Gold Standard (2017-01-30 13:51:59)

## 2018-08-24 NOTE — Progress Notes (Signed)
ROB-Doing well, notes increased headaches and reflux. 28 week labs today. TDaP given. Blood transfusion consent reviewed and signed. Follow up ultrasound within normal limits. Rx Protonix and Magnesium, see orders. Anticipatory guidance regarding course of prenatal care. Reviewed red flag symptoms and when to call. RTC x 2 weeks for ROB or sooner if needed.   ULTRASOUND REPORT  Location: [facility] Date of Service: 08/24/2018   Indications: Anatomy follow up ultrasound Findings:  Singleton intrauterine pregnancy is visualized with FHR at 143 BPM.  Fetal presentation is Cephalic.  Placenta: anterior. Grade: 1 AFI: subjectively normal.  Anatomic survey is complete.   There is no free peritoneal fluid in the cul de sac.  Impression: 1. [redacted]w[redacted]d Viable Singleton Intrauterine pregnancy previously established criteria. 2. Normal Anatomy Scan is now complete. No placental lake is seen. No circumvallate placental appearance  3. Placenta to Cervical IO shortest distance = 6.25cm  Recommendations: 1.Clinical correlation with the patient's History and Physical Exam.

## 2018-08-25 LAB — CBC
HEMOGLOBIN: 11.4 g/dL (ref 11.1–15.9)
Hematocrit: 33.2 % — ABNORMAL LOW (ref 34.0–46.6)
MCH: 31.6 pg (ref 26.6–33.0)
MCHC: 34.3 g/dL (ref 31.5–35.7)
MCV: 92 fL (ref 79–97)
PLATELETS: 171 10*3/uL (ref 150–450)
RBC: 3.61 x10E6/uL — ABNORMAL LOW (ref 3.77–5.28)
RDW: 11.7 % (ref 11.7–15.4)
WBC: 8.9 10*3/uL (ref 3.4–10.8)

## 2018-08-25 LAB — GLUCOSE, 1 HOUR GESTATIONAL: GESTATIONAL DIABETES SCREEN: 143 mg/dL — AB (ref 65–139)

## 2018-08-25 LAB — RPR: RPR: NONREACTIVE

## 2018-08-26 ENCOUNTER — Other Ambulatory Visit: Payer: Self-pay | Admitting: Certified Nurse Midwife

## 2018-08-26 DIAGNOSIS — O99810 Abnormal glucose complicating pregnancy: Secondary | ICD-10-CM

## 2018-08-26 DIAGNOSIS — Z3493 Encounter for supervision of normal pregnancy, unspecified, third trimester: Secondary | ICD-10-CM

## 2018-08-26 DIAGNOSIS — Z131 Encounter for screening for diabetes mellitus: Secondary | ICD-10-CM

## 2018-08-31 ENCOUNTER — Other Ambulatory Visit: Payer: No Typology Code available for payment source

## 2018-08-31 DIAGNOSIS — Z131 Encounter for screening for diabetes mellitus: Secondary | ICD-10-CM

## 2018-08-31 DIAGNOSIS — Z3493 Encounter for supervision of normal pregnancy, unspecified, third trimester: Secondary | ICD-10-CM

## 2018-08-31 DIAGNOSIS — O99810 Abnormal glucose complicating pregnancy: Secondary | ICD-10-CM

## 2018-09-01 LAB — GESTATIONAL GLUCOSE TOLERANCE
GLUCOSE FASTING: 75 mg/dL (ref 65–94)
Glucose, GTT - 1 Hour: 139 mg/dL (ref 65–179)
Glucose, GTT - 2 Hour: 154 mg/dL (ref 65–154)
Glucose, GTT - 3 Hour: 91 mg/dL (ref 65–139)

## 2018-09-07 ENCOUNTER — Ambulatory Visit (INDEPENDENT_AMBULATORY_CARE_PROVIDER_SITE_OTHER): Payer: No Typology Code available for payment source | Admitting: Certified Nurse Midwife

## 2018-09-07 VITALS — BP 114/63 | HR 88 | Wt 150.4 lb

## 2018-09-07 DIAGNOSIS — Z3483 Encounter for supervision of other normal pregnancy, third trimester: Secondary | ICD-10-CM

## 2018-09-07 LAB — POCT URINALYSIS DIPSTICK OB
BILIRUBIN UA: NEGATIVE
GLUCOSE, UA: NEGATIVE
Ketones, UA: NEGATIVE
LEUKOCYTES UA: NEGATIVE
Nitrite, UA: NEGATIVE
POC,PROTEIN,UA: NEGATIVE
RBC UA: NEGATIVE
SPEC GRAV UA: 1.015 (ref 1.010–1.025)
Urobilinogen, UA: 0.2 E.U./dL
pH, UA: 7 (ref 5.0–8.0)

## 2018-09-07 NOTE — Patient Instructions (Signed)
How a Baby Grows During Pregnancy  Pregnancy begins when a female's sperm enters a female's egg (fertilization). Fertilization usually happens in one of the tubes (fallopian tubes) that connect the ovaries to the womb (uterus). The fertilized egg moves down the fallopian tube to the uterus. Once it reaches the uterus, it implants into the lining of the uterus and begins to grow. For the first 10 weeks, the fertilized egg is called an embryo. After 10 weeks, it is called a fetus. As the fetus continues to grow, it receives oxygen and nutrients through tissue (placenta) that grows to support the developing baby. The placenta is the life support system for the baby. It provides oxygen and nutrition and removes waste. Learning as much as you can about your pregnancy and how your baby is developing can help you enjoy the experience. It can also make you aware of when there might be a problem and when to ask questions. How long does a typical pregnancy last? A pregnancy usually lasts 280 days, or about 40 weeks. Pregnancy is divided into three periods of growth, also called trimesters:  First trimester: 0-12 weeks.  Second trimester: 13-27 weeks.  Third trimester: 28-40 weeks. The day when your baby is ready to be born (full term) is your estimated date of delivery. How does my baby develop month by month? First month  The fertilized egg attaches to the inside of the uterus.  Some cells will form the placenta. Others will form the fetus.  The arms, legs, brain, spinal cord, lungs, and heart begin to develop.  At the end of the first month, the heart begins to beat. Second month  The bones, inner ear, eyelids, hands, and feet form.  The genitals develop.  By the end of 8 weeks, all major organs are developing. Third month  All of the internal organs are forming.  Teeth develop below the gums.  Bones and muscles begin to grow. The spine can flex.  The skin is transparent.  Fingernails  and toenails begin to form.  Arms and legs continue to grow longer, and hands and feet develop.  The fetus is about 3 inches (7.6 cm) long. Fourth month  The placenta is completely formed.  The external sex organs, neck, outer ear, eyebrows, eyelids, and fingernails are formed.  The fetus can hear, swallow, and move its arms and legs.  The kidneys begin to produce urine.  The skin is covered with a white, waxy coating (vernix) and very fine hair (lanugo). Fifth month  The fetus moves around more and can be felt for the first time (quickening).  The fetus starts to sleep and wake up and may begin to suck its finger.  The nails grow to the end of the fingers.  The organ in the digestive system that makes bile (gallbladder) functions and helps to digest nutrients.  If your baby is a girl, eggs are present in her ovaries. If your baby is a boy, testicles start to move down into his scrotum. Sixth month  The lungs are formed.  The eyes open. The brain continues to develop.  Your baby has fingerprints and toe prints. Your baby's hair grows thicker.  At the end of the second trimester, the fetus is about 9 inches (22.9 cm) long. Seventh month  The fetus kicks and stretches.  The eyes are developed enough to sense changes in light.  The hands can make a grasping motion.  The fetus responds to sound. Eighth month  All  fetus is about 9 inches (22.9 cm) long.  Seventh month   The fetus kicks and stretches.   The eyes are developed enough to sense changes in light.   The hands can make a grasping motion.   The fetus responds to sound.  Eighth month   All organs and body systems are fully developed and functioning.   Bones harden, and taste buds develop. The fetus may hiccup.   Certain areas of the brain are still developing. The skull remains soft.  Ninth month   The fetus gains about  lb (0.23 kg) each week.   The lungs are fully developed.   Patterns of sleep develop.   The fetus's head typically moves into a head-down position (vertex) in the uterus to prepare for birth.   The fetus weighs 6-9 lb (2.72-4.08 kg) and is 19-20 inches (48.26-50.8 cm) long.  What can I do to have a healthy pregnancy and help  my baby develop?  General instructions   Take prenatal vitamins as directed by your health care provider. These include vitamins such as folic acid, iron, calcium, and vitamin D. They are important for healthy development.   Take medicines only as directed by your health care provider. Read labels and ask a pharmacist or your health care provider whether over-the-counter medicines, supplements, and prescription drugs are safe to take during pregnancy.   Keep all follow-up visits as directed by your health care provider. This is important. Follow-up visits include prenatal care and screening tests.  How do I know if my baby is developing well?  At each prenatal visit, your health care provider will do several different tests to check on your health and keep track of your baby's development. These include:   Fundal height and position.  ? Your health care provider will measure your growing belly from your pubic bone to the top of the uterus using a tape measure.  ? Your health care provider will also feel your belly to determine your baby's position.   Heartbeat.  ? An ultrasound in the first trimester can confirm pregnancy and show a heartbeat, depending on how far along you are.  ? Your health care provider will check your baby's heart rate at every prenatal visit.   Second trimester ultrasound.  ? This ultrasound checks your baby's development. It also may show your baby's gender.  What should I do if I have concerns about my baby's development?  Always talk with your health care provider about any concerns that you may have about your pregnancy and your baby.  Summary   A pregnancy usually lasts 280 days, or about 40 weeks. Pregnancy is divided into three periods of growth, also called trimesters.   Your health care provider will monitor your baby's growth and development throughout your pregnancy.   Follow your health care provider's recommendations about taking prenatal vitamins and medicines during  your pregnancy.   Talk with your health care provider if you have any concerns about your pregnancy or your developing baby.  This information is not intended to replace advice given to you by your health care provider. Make sure you discuss any questions you have with your health care provider.  Document Released: 12/03/2007 Document Revised: 04/29/2017 Document Reviewed: 04/29/2017  Elsevier Interactive Patient Education  2019 Elsevier Inc.

## 2018-09-07 NOTE — Progress Notes (Signed)
ROB doing well. Feels good movement. No complaints. Follow up 2 wks with Melody.   Philip Aspen, CNM

## 2018-09-22 NOTE — Progress Notes (Signed)
Coronavirus (COVID-19) Are you at risk?  Are you at risk for the Coronavirus (COVID-19)?  To be considered HIGH RISK for Coronavirus (COVID-19), you have to meet the following criteria:  . Traveled to [place], [place], [place], [place] or [place]; or in the [place] to [place], [place], [place], or [place]; and have fever, cough, and shortness of breath within the last 2 weeks of travel OR . Been in close contact with a person diagnosed with COVID-19 within the last 2 weeks and have fever, cough, and shortness of breath . IF YOU DO NOT MEET THESE CRITERIA, YOU ARE CONSIDERED LOW RISK FOR COVID-19.  What to do if you are HIGH RISK for COVID-19?  . If you are having a medical emergency, call 911. . Seek medical care right away. Before you go to a doctor's office, urgent care or emergency department, call ahead and tell them about your recent travel, contact with someone diagnosed with COVID-19, and your symptoms. You should receive instructions from your physician's office regarding next steps of care.  . When you arrive at healthcare provider, tell the healthcare staff immediately you have returned from visiting [place], [place], [place], [place] or [place]; or traveled in the [place] to [place], [place], [place], or [place]; in the last two weeks or you have been in close contact with a person diagnosed with COVID-19 in the last 2 weeks.   . Tell the health care staff about your symptoms: fever, cough and shortness of breath. . After you have been seen by a medical provider, you will be either: o Tested for (COVID-19) and discharged home on quarantine except to seek medical care if symptoms worsen, and asked to  - Stay home and avoid contact with others until you get your results (4-5 days)  - Avoid travel on public transportation if possible (such as bus, train, or airplane) or o Sent to the Emergency Department by EMS for evaluation, COVID-19 testing, and possible  admission depending on your condition and test results.  What to do if you are LOW RISK for COVID-19?  Reduce your risk of any infection by using the same precautions used for avoiding the common cold or flu:  . Wash your hands often with soap and warm water for at least 20 seconds.  If soap and water are not readily available, use an alcohol-based hand sanitizer with at least 60% alcohol.  . If coughing or sneezing, cover your mouth and nose by coughing or sneezing into the elbow areas of your shirt or coat, into a tissue or into your sleeve (not your hands). . Avoid shaking hands with others and consider head nods or verbal greetings only. . Avoid touching your eyes, nose, or mouth with unwashed hands.  . Avoid close contact with people who are sick. . Avoid places or events with large numbers of people in one location, like concerts or sporting events. . Carefully consider travel plans you have or are making. . If you are planning any travel outside or inside the [place], visit the CDC's Travelers' Health webpage for the latest health notices. . If you have some symptoms but not all symptoms, continue to monitor at home and seek medical attention if your symptoms worsen. . If you are having a medical emergency, call 911.  Spoke with pt denies any sx. Amy Clontz, CMA   ADDITIONAL HEALTHCARE OPTIONS FOR PATIENTS  [facility] [facility] / e-Visit: [email or web address]           [facility]: [phone]  [facility]: [phone]                   [facility]: [phone]  

## 2018-09-23 ENCOUNTER — Other Ambulatory Visit: Payer: Self-pay

## 2018-09-23 ENCOUNTER — Ambulatory Visit (INDEPENDENT_AMBULATORY_CARE_PROVIDER_SITE_OTHER): Payer: No Typology Code available for payment source | Admitting: Obstetrics and Gynecology

## 2018-09-23 VITALS — BP 128/79 | HR 87 | Wt 151.2 lb

## 2018-09-23 DIAGNOSIS — Z3493 Encounter for supervision of normal pregnancy, unspecified, third trimester: Secondary | ICD-10-CM

## 2018-09-23 LAB — POCT URINALYSIS DIPSTICK OB
BILIRUBIN UA: NEGATIVE
Glucose, UA: NEGATIVE
KETONES UA: NEGATIVE
Leukocytes, UA: NEGATIVE
Nitrite, UA: NEGATIVE
PH UA: 6.5 (ref 5.0–8.0)
POC,PROTEIN,UA: NEGATIVE
RBC UA: NEGATIVE
Spec Grav, UA: 1.01 (ref 1.010–1.025)
UROBILINOGEN UA: 0.2 U/dL

## 2018-09-23 NOTE — Progress Notes (Signed)
ROB- doing well, discussed covid-19, cultures next visit.discussed BC and doesn't want another IUD- bled a lot, so will use OCPs.

## 2018-09-23 NOTE — Progress Notes (Signed)
ROB- pt is doing well 

## 2018-09-23 NOTE — Patient Instructions (Signed)
FREQUENTLY ASKED QUESTIONS FOR OBSTETRICS/PEDIATRICS    Q: Why are visitor restrictions different for maternity care areas?  [place] is restricting visitors for the duration of the patient's hospitalization. The birth of a child involves the mother, considered the patient, and a birthing partner. These are unprecedented times and we are making the exception to allow a birthing partner to be a part of the patient unit. No other guests will be allowed in our [place] at [facility] and at [facility].   Q: Are credentialed doulas allowed to support their existing patients?  We acknowledge the value these doula partnerships offer our care teams and many birthing families in our communities. Each laboring mother is allowed one birthing partner of the patient's choosing for her entire hospitalization.   Q: Are visitor restrictions different for hospitalized children?  Pediatric patients (infants and children under 1 years of age), such as those in the Children's Unit, Pediatric ICU and NICU, will be allowed two visitors (parents or legal guardians)   Q: Are pregnant women at an increased risk for COVID-19?  The [employer] of Obstetricians and Gynecologists (ACOG) is monitoring closely the coronavirus pandemic. With the limited information available, data does not indicate pregnant women are at an increased risk. However, pregnant women are known to be at greater risk for respiratory infections like flu. With that in mind, expectant mothers are considered an at-risk population for COVID-19, according to ACOG.   Q: Are newborns at an increased risk for COVID-19?  A limited sample of COVID-19 data with newborns indicates the virus is not transferred to the infant during pregnancy. However, postpartum separation is recommended by the Centers for  Disease Control (CDC). As a result [place] recommends and strongly encourages temporary separation of moms and babies who test positive for COVID-19 or are awaiting results to rule out COVID-19 based on CDC guidelines.   Q: If you have a suspected case of COVID-19, is the NICU couplet care room an option?  No. If either patient is considered at-risk for having COVID-19, the [place] [facility] will not use the NICU couplet care rooms for that family.   Q: [place] is urging that elective procedures be postponed. What is considered elective for women's and children's service line?  NOT ELECTIVE: Obstetric procedures, even those with an element of choice on timing, are not considered elective. Circumcisions are considered elective procedures, however, these do not deplete blood products and other resources, which is the spirit in which the COVID-19 postponement of elective procedures was intended. Therefore, circumcisions will be allowed.   ELECTIVE: Postpartum tubal ligations are considered elective and should be postponed. Q&A for Obstetricians, Gynecologists and Pediatricians  Published September 17, 2018   [facility] supports as much as possible the medical care  team working with the patient's individual needs to address timing during these unprecedented times. We seek the support of our medical care team in preserving needed resources throughout our crisis response to COVID-19.   Q: How does COVID-19 impact breastfeeding?  Breastmilk is safe for your baby - even if the mother has tested positive for COVID-19. If a COVID-19+ mother decides to breastfeed while inpatient and after discharge, we suggest proper protective equipment be worn and hand hygiene be performed before and after feeding the infant. The new mother also has the option to pump her milk and have a healthy family member feed the baby to protect the baby from getting the virus.   Q: Should we urge  patients to avoid baby showers and large gatherings?  Yes. As has been recommended for all citizens in our communities, gatherings of 10 or more should be avoided - pregnant or not. Seek creative options for "hosting" baby showers through electronic means that honor the request for social distancing during this time of heightened awareness.   Q: Should patients miss their prenatal appointments?  No. Prenatal visits are NOT elective. While we want to limit contact and exposure, prenatal care is vital right now. Contact your physician's office if you have concerns about your visits. We are limiting outpatient office visits to the patient and one guest in order to reduce the potential for exposure.   Q: What if a pregnant woman feels sick? Should she miss her prenatal visit then?  A pregnant woman experiencing coronavirus-like symptoms (i.e., cough, fever, difficulty breathing, shortness of breath, gastrointestinal issues) should contact her pregnancy care provider by phone. Her medical professional can best determine whether she should use a video visit or possibly go to a collection site to be tested for COVID-19. Contacting her primary care provider or her pregnancy care provider is her first step.   Q: What can I do about childbirth education? All the classes are cancelled.  The [facility] [place] will offer online learning to support mothers on their journey. We currently offer Understanding Childbirth, Understanding Breastfeeding and Understanding Newborn Care as an online class. Please visit our website, [email or web address], to register for an online class.   Q: How can I keep from getting COVID-19? Q&A for Obstetricians, Gynecologists and Pediatricians  Published September 17, 2018   Together, we can reduce the risk of exposure to the virus and help you and your family remain healthy and safe. One of the best ways to protect yourself is to wash your hands frequently using soap and  water. Also, you should avoid touching your eyes, nose and mouth with unwashed hands, avoid physical contact with others and practice social distancing.   Q: How are employees being informed about what to do?  [place] leaders receive a daily COVID-19 update and share relevant information with their teams. This is a time when health care professionals are called on to lead within our community. We appreciate our staff's engagement with our COVID-19 updates and encourage them to share best practices on reducing the spread of the virus with our patients and community. We are prepared to provide the exceptional COVID-19 care and coordination our community needs, expects and deserves.   Q: Who's in charge of this issue at [facility]?  The leadership structure and process established to address COVID-19 includes Chief Physician Executive Phoebe Sharps, MD; Infection Prevention Medical Director Carlyle Basques, MD; and Infection Prevention Interim Director Hubert Azure, MSN, RN, CIC, CSPDT. A team  of [place] experts reflecting a broad spectrum of our workforce is meeting daily to evaluate new information we receive about COVID-19 and to adapt policies and practices accordingly.                         Published September 17, 2018    

## 2018-09-28 ENCOUNTER — Encounter: Payer: Self-pay | Admitting: Certified Nurse Midwife

## 2018-09-29 ENCOUNTER — Other Ambulatory Visit: Payer: Self-pay

## 2018-09-29 ENCOUNTER — Encounter: Payer: Self-pay | Admitting: Certified Nurse Midwife

## 2018-09-29 MED ORDER — PANTOPRAZOLE SODIUM 20 MG PO TBEC: 20.0000 mg | DELAYED_RELEASE_TABLET | Freq: Every day | ORAL | 2 refills | Status: DC

## 2018-09-30 ENCOUNTER — Other Ambulatory Visit: Payer: Self-pay

## 2018-09-30 MED ORDER — PANTOPRAZOLE SODIUM 20 MG PO TBEC: 20.0000 mg | DELAYED_RELEASE_TABLET | Freq: Every day | ORAL | 2 refills | Status: DC

## 2018-10-04 DIAGNOSIS — Z0289 Encounter for other administrative examinations: Secondary | ICD-10-CM

## 2018-10-05 MED FILL — PANTOPRAZOLE SOD DR 20 MG T: 20 | 30 days supply | Qty: 30 | Fill #0

## 2018-10-13 ENCOUNTER — Telehealth: Payer: Self-pay | Admitting: *Deleted

## 2018-10-13 ENCOUNTER — Telehealth: Payer: Self-pay | Admitting: Obstetrics and Gynecology

## 2018-10-13 NOTE — Telephone Encounter (Signed)
Coronavirus (COVID-19) Are you at risk?  Are you at risk for the Coronavirus (COVID-19)?  To be considered HIGH RISK for Coronavirus (COVID-19), you have to meet the following criteria:  . Traveled to [place], [place], [place], [place] or [place]; or in the [place] to [place], [place], [place], or [place]; and have fever, cough, and shortness of breath within the last 2 weeks of travel OR . Been in close contact with a person diagnosed with COVID-19 within the last 2 weeks and have fever, cough, and shortness of breath . IF YOU DO NOT MEET THESE CRITERIA, YOU ARE CONSIDERED LOW RISK FOR COVID-19.  What to do if you are HIGH RISK for COVID-19?  . If you are having a medical emergency, call 911. . Seek medical care right away. Before you go to a doctor's office, urgent care or emergency department, call ahead and tell them about your recent travel, contact with someone diagnosed with COVID-19, and your symptoms. You should receive instructions from your physician's office regarding next steps of care.  . When you arrive at healthcare provider, tell the healthcare staff immediately you have returned from visiting [place], [place], [place], [place] or [place]; or traveled in the [place] to [place], [place], [place], or [place]; in the last two weeks or you have been in close contact with a person diagnosed with COVID-19 in the last 2 weeks.   . Tell the health care staff about your symptoms: fever, cough and shortness of breath. . After you have been seen by a medical provider, you will be either: o Tested for (COVID-19) and discharged home on quarantine except to seek medical care if symptoms worsen, and asked to  - Stay home and avoid contact with others until you get your results (4-5 days)  - Avoid travel on public transportation if possible (such as bus, train, or airplane) or o Sent to the Emergency Department by EMS for evaluation, COVID-19 testing, and possible  admission depending on your condition and test results.  What to do if you are LOW RISK for COVID-19?  Reduce your risk of any infection by using the same precautions used for avoiding the common cold or flu:  . Wash your hands often with soap and warm water for at least 20 seconds.  If soap and water are not readily available, use an alcohol-based hand sanitizer with at least 60% alcohol.  . If coughing or sneezing, cover your mouth and nose by coughing or sneezing into the elbow areas of your shirt or coat, into a tissue or into your sleeve (not your hands). . Avoid shaking hands with others and consider head nods or verbal greetings only. . Avoid touching your eyes, nose, or mouth with unwashed hands.  . Avoid close contact with people who are sick. . Avoid places or events with large numbers of people in one location, like concerts or sporting events. . Carefully consider travel plans you have or are making. . If you are planning any travel outside or inside the [place], visit the CDC's Travelers' Health webpage for the latest health notices. . If you have some symptoms but not all symptoms, continue to monitor at home and seek medical attention if your symptoms worsen. . If you are having a medical emergency, call 911.   ADDITIONAL HEALTHCARE OPTIONS FOR PATIENTS  [place] [facility] / e-Visit: [email or web address]         [facility]: [phone]  [facility]: [phone]                   [facility]: [phone]   Spoke with pt denies any sx.   , CMA 

## 2018-10-13 NOTE — Telephone Encounter (Signed)
The patient stated she missed a call. Was not able to see missed call in chart or reach a nurse/sent message back for return call. Please advise.

## 2018-10-13 NOTE — Telephone Encounter (Signed)
Called pt.

## 2018-10-14 ENCOUNTER — Other Ambulatory Visit: Payer: Self-pay

## 2018-10-14 ENCOUNTER — Ambulatory Visit (INDEPENDENT_AMBULATORY_CARE_PROVIDER_SITE_OTHER): Payer: No Typology Code available for payment source | Admitting: Certified Nurse Midwife

## 2018-10-14 VITALS — BP 112/79 | HR 110 | Wt 158.0 lb

## 2018-10-14 DIAGNOSIS — Z3493 Encounter for supervision of normal pregnancy, unspecified, third trimester: Secondary | ICD-10-CM

## 2018-10-14 DIAGNOSIS — Z3685 Encounter for antenatal screening for Streptococcus B: Secondary | ICD-10-CM

## 2018-10-14 DIAGNOSIS — Z113 Encounter for screening for infections with a predominantly sexual mode of transmission: Secondary | ICD-10-CM

## 2018-10-14 LAB — POCT URINALYSIS DIPSTICK OB
Bilirubin, UA: NEGATIVE
Blood, UA: NEGATIVE
Glucose, UA: NEGATIVE
Ketones, UA: NEGATIVE
Leukocytes, UA: NEGATIVE
Nitrite, UA: NEGATIVE
POC,PROTEIN,UA: NEGATIVE
Spec Grav, UA: 1.01 (ref 1.010–1.025)
Urobilinogen, UA: 0.2 E.U./dL
pH, UA: 7.5 (ref 5.0–8.0)

## 2018-10-14 NOTE — Patient Instructions (Signed)
Vaginal Delivery  Vaginal delivery means that you give birth by pushing your baby out of your birth canal (vagina). A team of health care providers will help you before, during, and after vaginal delivery. Birth experiences are unique for every woman and every pregnancy, and birth experiences vary depending on where you choose to give birth. What happens when I arrive at the birth center or hospital? Once you are in labor and have been admitted into the hospital or birth center, your health care provider may:  Review your pregnancy history and any concerns that you have.  Insert an IV into one of your veins. This may be used to give you fluids and medicines.  Check your blood pressure, pulse, temperature, and heart rate (vital signs).  Check whether your bag of water (amniotic sac) has broken (ruptured).  Talk with you about your birth plan and discuss pain control options. Monitoring Your health care provider may monitor your contractions (uterine monitoring) and your baby's heart rate (fetal monitoring). You may need to be monitored:  Often, but not continuously (intermittently).  All the time or for long periods at a time (continuously). Continuous monitoring may be needed if: ? You are taking certain medicines, such as medicine to relieve pain or make your contractions stronger. ? You have pregnancy or labor complications. Monitoring may be done by:  Placing a special stethoscope or a handheld monitoring device on your abdomen to check your baby's heartbeat and to check for contractions.  Placing monitors on your abdomen (external monitors) to record your baby's heartbeat and the frequency and length of contractions.  Placing monitors inside your uterus through your vagina (internal monitors) to record your baby's heartbeat and the frequency, length, and strength of your contractions. Depending on the type of monitor, it may remain in your uterus or on your baby's head until birth.   Telemetry. This is a type of continuous monitoring that can be done with external or internal monitors. Instead of having to stay in bed, you are able to move around during telemetry. Physical exam Your health care provider may perform frequent physical exams. This may include:  Checking how and where your baby is positioned in your uterus.  Checking your cervix to determine: ? Whether it is thinning out (effacing). ? Whether it is opening up (dilating). What happens during labor and delivery?  Normal labor and delivery is divided into the following three stages: Stage 1  This is the longest stage of labor.  This stage can last for hours or days.  Throughout this stage, you will feel contractions. Contractions generally feel mild, infrequent, and irregular at first. They get stronger, more frequent (about every 2-3 minutes), and more regular as you move through this stage.  This stage ends when your cervix is completely dilated to 4 inches (10 cm) and completely effaced. Stage 2  This stage starts once your cervix is completely effaced and dilated and lasts until the delivery of your baby.  This stage may last from 20 minutes to 2 hours.  This is the stage where you will feel an urge to push your baby out of your vagina.  You may feel stretching and burning pain, especially when the widest part of your baby's head passes through the vaginal opening (crowning).  Once your baby is delivered, the umbilical cord will be clamped and cut. This usually occurs after waiting a period of 1-2 minutes after delivery.  Your baby will be placed on your bare chest (  skin-to-skin contact) in an upright position and covered with a warm blanket. Watch your baby for feeding cues, like rooting or sucking, and help the baby to your breast for his or her first feeding. Stage 3  This stage starts immediately after the birth of your baby and ends after you deliver the placenta.  This stage may take  anywhere from 5 to 30 minutes.  After your baby has been delivered, you will feel contractions as your body expels the placenta and your uterus contracts to control bleeding. What can I expect after labor and delivery?  After labor is over, you and your baby will be monitored closely until you are ready to go home to ensure that you are both healthy. Your health care team will teach you how to care for yourself and your baby.  You and your baby will stay in the same room (rooming in) during your hospital stay. This will encourage early bonding and successful breastfeeding.  You may continue to receive fluids and medicines through an IV.  Your uterus will be checked and massaged regularly (fundal massage).  You will have some soreness and pain in your abdomen, vagina, and the area of skin between your vaginal opening and your anus (perineum).  If an incision was made near your vagina (episiotomy) or if you had some vaginal tearing during delivery, cold compresses may be placed on your episiotomy or your tear. This helps to reduce pain and swelling.  You may be given a squirt bottle to use instead of wiping when you go to the bathroom. To use the squirt bottle, follow these steps: ? Before you urinate, fill the squirt bottle with warm water. Do not use hot water. ? After you urinate, while you are sitting on the toilet, use the squirt bottle to rinse the area around your urethra and vaginal opening. This rinses away any urine and blood. ? Fill the squirt bottle with clean water every time you use the bathroom.  It is normal to have vaginal bleeding after delivery. Wear a sanitary pad for vaginal bleeding and discharge. Summary  Vaginal delivery means that you will give birth by pushing your baby out of your birth canal (vagina).  Your health care provider may monitor your contractions (uterine monitoring) and your baby's heart rate (fetal monitoring).  Your health care provider may perform  a physical exam.  Normal labor and delivery is divided into three stages.  After labor is over, you and your baby will be monitored closely until you are ready to go home. This information is not intended to replace advice given to you by your health care provider. Make sure you discuss any questions you have with your health care provider. Document Released: 03/25/2008 Document Revised: 07/21/2017 Document Reviewed: 07/21/2017 Elsevier Interactive Patient Education  2019 [place]. Fetal Movement Counts Patient Name: ________________________________________________ Patient Due Date: ____________________ What is a fetal movement count?  A fetal movement count is the number of times that you feel your baby move during a certain amount of time. This may also be called a fetal kick count. A fetal movement count is recommended for every pregnant woman. You may be asked to start counting fetal movements as early as week 28 of your pregnancy. Pay attention to when your baby is most active. You may notice your baby's sleep and wake cycles. You may also notice things that make your baby move more. You should do a fetal movement count:  When your baby is normally  most active.  At the same time each day. A good time to count movements is while you are resting, after having something to eat and drink. How do I count fetal movements? 1. Find a quiet, comfortable area. Sit, or lie down on your side. 2. Write down the date, the start time and stop time, and the number of movements that you felt between those two times. Take this information with you to your health care visits. 3. For 2 hours, count kicks, flutters, swishes, rolls, and jabs. You should feel at least 10 movements during 2 hours. 4. You may stop counting after you have felt 10 movements. 5. If you do not feel 10 movements in 2 hours, have something to eat and drink. Then, keep resting and counting for 1 hour. If you feel at least 4  movements during that hour, you may stop counting. Contact a health care provider if:  You feel fewer than 4 movements in 2 hours.  Your baby is not moving like he or she usually does. Date: ____________ Start time: ____________ Stop time: ____________ Movements: ____________ Date: ____________ Start time: ____________ Stop time: ____________ Movements: ____________ Date: ____________ Start time: ____________ Stop time: ____________ Movements: ____________ Date: ____________ Start time: ____________ Stop time: ____________ Movements: ____________ Date: ____________ Start time: ____________ Stop time: ____________ Movements: ____________ Date: ____________ Start time: ____________ Stop time: ____________ Movements: ____________ Date: ____________ Start time: ____________ Stop time: ____________ Movements: ____________ Date: ____________ Start time: ____________ Stop time: ____________ Movements: ____________ Date: ____________ Start time: ____________ Stop time: ____________ Movements: ____________ This information is not intended to replace advice given to you by your health care provider. Make sure you discuss any questions you have with your health care provider. Document Released: 07/16/2006 Document Revised: 02/13/2016 Document Reviewed: 07/26/2015 Elsevier Interactive Patient Education  2019 [employer].

## 2018-10-14 NOTE — Progress Notes (Signed)
ROB-Reports increased pelvic pressure and overall discomfort. Discussed home treatment measures. 36 week cultures today. Requests SVE. Work note given. Anticipatory guidance regarding course of prenatal care. Reviewed red flag symptoms and when to call. RTC x 2 weeks for ROB or sooner if needed.

## 2018-10-16 LAB — STREP GP B NAA: Strep Gp B NAA: NEGATIVE

## 2018-10-16 LAB — GC/CHLAMYDIA PROBE AMP
Chlamydia trachomatis, NAA: NEGATIVE
Neisseria Gonorrhoeae by PCR: NEGATIVE

## 2018-10-17 ENCOUNTER — Encounter: Payer: Self-pay | Admitting: Certified Nurse Midwife

## 2018-10-27 ENCOUNTER — Encounter: Payer: Self-pay | Admitting: Certified Nurse Midwife

## 2018-10-27 ENCOUNTER — Other Ambulatory Visit: Payer: Self-pay

## 2018-10-27 ENCOUNTER — Telehealth: Payer: Self-pay | Admitting: Certified Nurse Midwife

## 2018-10-27 ENCOUNTER — Telehealth: Payer: Self-pay

## 2018-10-27 ENCOUNTER — Ambulatory Visit (INDEPENDENT_AMBULATORY_CARE_PROVIDER_SITE_OTHER): Payer: No Typology Code available for payment source | Admitting: Certified Nurse Midwife

## 2018-10-27 VITALS — BP 122/82 | HR 106 | Wt 159.3 lb

## 2018-10-27 DIAGNOSIS — Z3A37 37 weeks gestation of pregnancy: Secondary | ICD-10-CM

## 2018-10-27 NOTE — Patient Instructions (Signed)
Braxton Hicks Contractions Contractions of the uterus can occur throughout pregnancy, but they are not always a sign that you are in labor. You may have practice contractions called Braxton Hicks contractions. These false labor contractions are sometimes confused with true labor. What are Braxton Hicks contractions? Braxton Hicks contractions are tightening movements that occur in the muscles of the uterus before labor. Unlike true labor contractions, these contractions do not result in opening (dilation) and thinning of the cervix. Toward the end of pregnancy (32-34 weeks), Braxton Hicks contractions can happen more often and may become stronger. These contractions are sometimes difficult to tell apart from true labor because they can be very uncomfortable. You should not feel embarrassed if you go to the hospital with false labor. Sometimes, the only way to tell if you are in true labor is for your health care provider to look for changes in the cervix. The health care provider will do a physical exam and may monitor your contractions. If you are not in true labor, the exam should show that your cervix is not dilating and your water has not broken. If there are no other health problems associated with your pregnancy, it is completely safe for you to be sent home with false labor. You may continue to have Braxton Hicks contractions until you go into true labor. How to tell the difference between true labor and false labor True labor  Contractions last 30-70 seconds.  Contractions become very regular.  Discomfort is usually felt in the top of the uterus, and it spreads to the lower abdomen and low back.  Contractions do not go away with walking.  Contractions usually become more intense and increase in frequency.  The cervix dilates and gets thinner. False labor  Contractions are usually shorter and not as strong as true labor contractions.  Contractions are usually irregular.  Contractions  are often felt in the front of the lower abdomen and in the groin.  Contractions may go away when you walk around or change positions while lying down.  Contractions get weaker and are shorter-lasting as time goes on.  The cervix usually does not dilate or become thin. Follow these instructions at home:   Take over-the-counter and prescription medicines only as told by your health care provider.  Keep up with your usual exercises and follow other instructions from your health care provider.  Eat and drink lightly if you think you are going into labor.  If Braxton Hicks contractions are making you uncomfortable: ? Change your position from lying down or resting to walking, or change from walking to resting. ? Sit and rest in a tub of warm water. ? Drink enough fluid to keep your urine pale yellow. Dehydration may cause these contractions. ? Do slow and deep breathing several times an hour.  Keep all follow-up prenatal visits as told by your health care provider. This is important. Contact a health care provider if:  You have a fever.  You have continuous pain in your abdomen. Get help right away if:  Your contractions become stronger, more regular, and closer together.  You have fluid leaking or gushing from your vagina.  You pass blood-tinged mucus (bloody show).  You have bleeding from your vagina.  You have low back pain that you never had before.  You feel your baby's head pushing down and causing pelvic pressure.  Your baby is not moving inside you as much as it used to. Summary  Contractions that occur before labor are   called Braxton Hicks contractions, false labor, or practice contractions.  Braxton Hicks contractions are usually shorter, weaker, farther apart, and less regular than true labor contractions. True labor contractions usually become progressively stronger and regular, and they become more frequent.  Manage discomfort from Braxton Hicks contractions  by changing position, resting in a warm bath, drinking plenty of water, or practicing deep breathing. This information is not intended to replace advice given to you by your health care provider. Make sure you discuss any questions you have with your health care provider. Document Released: 10/30/2016 Document Revised: 03/31/2017 Document Reviewed: 10/30/2016 Elsevier Interactive Patient Education  2019 [employer] Inc.  

## 2018-10-27 NOTE — Telephone Encounter (Signed)
appt made for today 10/27/18

## 2018-10-27 NOTE — Progress Notes (Signed)
Pt returned for repeat exam. No change in cervix. Labor precautions reviewed. Follow up at 1 wk. Of PRN for labor.   Philip Aspen, CNM

## 2018-10-27 NOTE — Progress Notes (Signed)
Pt presents today for problem visit. Rule out labor. She complains of a lot of pressure and contractions that are irregular with mild intensity. She denies LOF or vaginal bleeding. Feels good movement. SVE 4.60/-2. Pt will walk out side and return in 2-3 hrs for recheck.   Philip Aspen, CNM

## 2018-10-27 NOTE — Telephone Encounter (Signed)
Patient called stating she is 38 weeks tomorrow and has had increased pelvic pressure starting when she woke up this morning. She states it hurts to walk. Please Advise

## 2018-10-29 ENCOUNTER — Encounter: Payer: No Typology Code available for payment source | Admitting: Certified Nurse Midwife

## 2018-11-02 ENCOUNTER — Telehealth: Payer: Self-pay

## 2018-11-02 NOTE — Telephone Encounter (Signed)
Coronavirus (COVID-19) Are you at risk?  Are you at risk for the Coronavirus (COVID-19)?  To be considered HIGH RISK for Coronavirus (COVID-19), you have to meet the following criteria:  . Traveled to [place], [place], [place], [place] or [place]; or in the [place] to [place], [place], [place], or [place]; and have fever, cough, and shortness of breath within the last 2 weeks of travel OR . Been in close contact with a person diagnosed with COVID-19 within the last 2 weeks and have fever, cough, and shortness of breath . IF YOU DO NOT MEET THESE CRITERIA, YOU ARE CONSIDERED LOW RISK FOR COVID-19.  What to do if you are HIGH RISK for COVID-19?  . If you are having a medical emergency, call 911. . Seek medical care right away. Before you go to a doctor's office, urgent care or emergency department, call ahead and tell them about your recent travel, contact with someone diagnosed with COVID-19, and your symptoms. You should receive instructions from your physician's office regarding next steps of care.  . When you arrive at healthcare provider, tell the healthcare staff immediately you have returned from visiting [place], [place], [place], [place] or [place]; or traveled in the [place] to [place], [place], [place], or [place]; in the last two weeks or you have been in close contact with a person diagnosed with COVID-19 in the last 2 weeks.   . Tell the health care staff about your symptoms: fever, cough and shortness of breath. . After you have been seen by a medical provider, you will be either: o Tested for (COVID-19) and discharged home on quarantine except to seek medical care if symptoms worsen, and asked to  - Stay home and avoid contact with others until you get your results (4-5 days)  - Avoid travel on public transportation if possible (such as bus, train, or airplane) or o Sent to the Emergency Department by EMS for evaluation, COVID-19 testing, and possible  admission depending on your condition and test results.  What to do if you are LOW RISK for COVID-19?  Reduce your risk of any infection by using the same precautions used for avoiding the common cold or flu:  . Wash your hands often with soap and warm water for at least 20 seconds.  If soap and water are not readily available, use an alcohol-based hand sanitizer with at least 60% alcohol.  . If coughing or sneezing, cover your mouth and nose by coughing or sneezing into the elbow areas of your shirt or coat, into a tissue or into your sleeve (not your hands). . Avoid shaking hands with others and consider head nods or verbal greetings only. . Avoid touching your eyes, nose, or mouth with unwashed hands.  . Avoid close contact with people who are sick. . Avoid places or events with large numbers of people in one location, like concerts or sporting events. . Carefully consider travel plans you have or are making. . If you are planning any travel outside or inside the [place], visit the CDC's Travelers' Health webpage for the latest health notices. . If you have some symptoms but not all symptoms, continue to monitor at home and seek medical attention if your symptoms worsen. . If you are having a medical emergency, call 911.   ADDITIONAL HEALTHCARE OPTIONS FOR PATIENTS  [place] / e-Visit: [email or web address]         [facility]: [phone]  [facility]: [phone]                   [facility]: [phone]   Pre-screen negative, DM.   

## 2018-11-03 ENCOUNTER — Encounter: Payer: Self-pay | Admitting: Certified Nurse Midwife

## 2018-11-03 ENCOUNTER — Ambulatory Visit (INDEPENDENT_AMBULATORY_CARE_PROVIDER_SITE_OTHER): Payer: No Typology Code available for payment source | Admitting: Certified Nurse Midwife

## 2018-11-03 ENCOUNTER — Other Ambulatory Visit: Payer: Self-pay | Admitting: Certified Nurse Midwife

## 2018-11-03 ENCOUNTER — Other Ambulatory Visit: Payer: Self-pay

## 2018-11-03 VITALS — BP 125/78 | HR 100 | Wt 162.0 lb

## 2018-11-03 DIAGNOSIS — Z3493 Encounter for supervision of normal pregnancy, unspecified, third trimester: Secondary | ICD-10-CM

## 2018-11-03 LAB — POCT URINALYSIS DIPSTICK OB
Bilirubin, UA: NEGATIVE
Blood, UA: NEGATIVE
Glucose, UA: NEGATIVE
Ketones, UA: NEGATIVE
Leukocytes, UA: NEGATIVE
Nitrite, UA: NEGATIVE
POC,PROTEIN,UA: NEGATIVE
Spec Grav, UA: 1.025 (ref 1.010–1.025)
Urobilinogen, UA: 0.2 E.U./dL
pH, UA: 5 (ref 5.0–8.0)

## 2018-11-03 MED FILL — PANTOPRAZOLE SOD DR 20 MG T: 20 | 30 days supply | Qty: 30 | Fill #1

## 2018-11-03 NOTE — Patient Instructions (Signed)
Braxton Hicks Contractions Contractions of the uterus can occur throughout pregnancy, but they are not always a sign that you are in labor. You may have practice contractions called Braxton Hicks contractions. These false labor contractions are sometimes confused with true labor. What are Braxton Hicks contractions? Braxton Hicks contractions are tightening movements that occur in the muscles of the uterus before labor. Unlike true labor contractions, these contractions do not result in opening (dilation) and thinning of the cervix. Toward the end of pregnancy (32-34 weeks), Braxton Hicks contractions can happen more often and may become stronger. These contractions are sometimes difficult to tell apart from true labor because they can be very uncomfortable. You should not feel embarrassed if you go to the hospital with false labor. Sometimes, the only way to tell if you are in true labor is for your health care provider to look for changes in the cervix. The health care provider will do a physical exam and may monitor your contractions. If you are not in true labor, the exam should show that your cervix is not dilating and your water has not broken. If there are no other health problems associated with your pregnancy, it is completely safe for you to be sent home with false labor. You may continue to have Braxton Hicks contractions until you go into true labor. How to tell the difference between true labor and false labor True labor  Contractions last 30-70 seconds.  Contractions become very regular.  Discomfort is usually felt in the top of the uterus, and it spreads to the lower abdomen and low back.  Contractions do not go away with walking.  Contractions usually become more intense and increase in frequency.  The cervix dilates and gets thinner. False labor  Contractions are usually shorter and not as strong as true labor contractions.  Contractions are usually irregular.  Contractions  are often felt in the front of the lower abdomen and in the groin.  Contractions may go away when you walk around or change positions while lying down.  Contractions get weaker and are shorter-lasting as time goes on.  The cervix usually does not dilate or become thin. Follow these instructions at home:   Take over-the-counter and prescription medicines only as told by your health care provider.  Keep up with your usual exercises and follow other instructions from your health care provider.  Eat and drink lightly if you think you are going into labor.  If Braxton Hicks contractions are making you uncomfortable: ? Change your position from lying down or resting to walking, or change from walking to resting. ? Sit and rest in a tub of warm water. ? Drink enough fluid to keep your urine pale yellow. Dehydration may cause these contractions. ? Do slow and deep breathing several times an hour.  Keep all follow-up prenatal visits as told by your health care provider. This is important. Contact a health care provider if:  You have a fever.  You have continuous pain in your abdomen. Get help right away if:  Your contractions become stronger, more regular, and closer together.  You have fluid leaking or gushing from your vagina.  You pass blood-tinged mucus (bloody show).  You have bleeding from your vagina.  You have low back pain that you never had before.  You feel your baby's head pushing down and causing pelvic pressure.  Your baby is not moving inside you as much as it used to. Summary  Contractions that occur before labor are   called Braxton Hicks contractions, false labor, or practice contractions.  Braxton Hicks contractions are usually shorter, weaker, farther apart, and less regular than true labor contractions. True labor contractions usually become progressively stronger and regular, and they become more frequent.  Manage discomfort from Braxton Hicks contractions  by changing position, resting in a warm bath, drinking plenty of water, or practicing deep breathing. This information is not intended to replace advice given to you by your health care provider. Make sure you discuss any questions you have with your health care provider. Document Released: 10/30/2016 Document Revised: 03/31/2017 Document Reviewed: 10/30/2016 Elsevier Interactive Patient Education  2019 [employer] Inc.  

## 2018-11-03 NOTE — Progress Notes (Signed)
ROB doing well. Is uncomfortable. Continues to have irregular contractions and pressure. Feels good movement. SVE 4-5cm/70/-2. PT requesting induction. Induction Scheduled 11/04/18 @0700 .  Philip Aspen, CNM

## 2018-11-04 ENCOUNTER — Inpatient Hospital Stay: Payer: No Typology Code available for payment source | Admitting: Anesthesiology

## 2018-11-04 ENCOUNTER — Inpatient Hospital Stay
Admission: EM | Admit: 2018-11-04 | Discharge: 2018-11-05 | DRG: 807 | Disposition: A | Discharge status: Home / Self Care | Payer: No Typology Code available for payment source | Attending: Certified Nurse Midwife | Admitting: Certified Nurse Midwife

## 2018-11-04 ENCOUNTER — Other Ambulatory Visit: Payer: Self-pay

## 2018-11-04 DIAGNOSIS — O26893 Other specified pregnancy related conditions, third trimester: Secondary | ICD-10-CM | POA: Diagnosis present

## 2018-11-04 DIAGNOSIS — Z3A39 39 weeks gestation of pregnancy: Secondary | ICD-10-CM | POA: Diagnosis not present

## 2018-11-04 LAB — CBC
HCT: 33.8 % — ABNORMAL LOW (ref 36.0–46.0)
Hemoglobin: 11.6 g/dL — ABNORMAL LOW (ref 12.0–15.0)
MCH: 30.8 pg (ref 26.0–34.0)
MCHC: 34.3 g/dL (ref 30.0–36.0)
MCV: 89.7 fL (ref 80.0–100.0)
Platelets: 141 10*3/uL — ABNORMAL LOW (ref 150–400)
RBC: 3.77 MIL/uL — ABNORMAL LOW (ref 3.87–5.11)
RDW: 12.6 % (ref 11.5–15.5)
WBC: 8.2 10*3/uL (ref 4.0–10.5)
nRBC: 0 % (ref 0.0–0.2)

## 2018-11-04 LAB — TYPE AND SCREEN
ABO/RH(D): A POS
Antibody Screen: NEGATIVE

## 2018-11-04 MED ORDER — LIDOCAINE-EPINEPHRINE (PF) 1.5 %-1:200000 IJ SOLN
INTRAMUSCULAR | Status: DC | PRN
  Administered 2018-11-04: 3 mL

## 2018-11-04 MED ORDER — LACTATED RINGERS IV SOLN
500.0000 mL | INTRAVENOUS | Status: DC | PRN
  Administered 2018-11-04: 500 mL via INTRAVENOUS

## 2018-11-04 MED ORDER — PHENYLEPHRINE 40 MCG/ML (10ML) SYRINGE FOR IV PUSH (FOR BLOOD PRESSURE SUPPORT)
80.0000 ug | PREFILLED_SYRINGE | INTRAVENOUS | Status: DC | PRN
  Filled 2018-11-04: qty 10

## 2018-11-04 MED ORDER — ACETAMINOPHEN 325 MG PO TABS
650.0000 mg | ORAL_TABLET | ORAL | Status: DC | PRN
  Administered 2018-11-04: 650 mg via ORAL

## 2018-11-04 MED ORDER — ACETAMINOPHEN 325 MG PO TABS
650.0000 mg | ORAL_TABLET | ORAL | Status: DC | PRN
  Filled 2018-11-04: qty 2

## 2018-11-04 MED ORDER — LIDOCAINE HCL (PF) 1 % IJ SOLN
INTRAMUSCULAR | Status: AC
  Filled 2018-11-04: qty 30

## 2018-11-04 MED ORDER — METHYLERGONOVINE MALEATE 0.2 MG/ML IJ SOLN
0.2000 mg | INTRAMUSCULAR | Status: DC | PRN
  Administered 2018-11-04: 0.2 mg via INTRAMUSCULAR

## 2018-11-04 MED ORDER — DIPHENHYDRAMINE HCL 50 MG/ML IJ SOLN: 12.5000 mg | INTRAMUSCULAR | Status: DC | PRN

## 2018-11-04 MED ORDER — IBUPROFEN 600 MG PO TABS
600.0000 mg | ORAL_TABLET | Freq: Four times a day (QID) | ORAL | Status: DC
  Administered 2018-11-04: 600 mg via ORAL
  Filled 2018-11-04: qty 1

## 2018-11-04 MED ORDER — LACTATED RINGERS IV SOLN: 500.0000 mL | Freq: Once | INTRAVENOUS | Status: DC

## 2018-11-04 MED ORDER — AMMONIA AROMATIC IN INHA
RESPIRATORY_TRACT | Status: AC
  Filled 2018-11-04: qty 10

## 2018-11-04 MED ORDER — ONDANSETRON HCL 4 MG/2ML IJ SOLN
4.0000 mg | Freq: Four times a day (QID) | INTRAMUSCULAR | Status: DC | PRN
  Filled 2018-11-04: qty 2

## 2018-11-04 MED ORDER — ONDANSETRON HCL 4 MG PO TABS: 4.0000 mg | ORAL_TABLET | ORAL | Status: DC | PRN

## 2018-11-04 MED ORDER — SODIUM CHLORIDE 0.9 % IV SOLN
INTRAVENOUS | Status: DC | PRN
  Administered 2018-11-04 (×3): 5 mL via EPIDURAL

## 2018-11-04 MED ORDER — ONDANSETRON HCL 4 MG/2ML IJ SOLN
4.0000 mg | INTRAMUSCULAR | Status: DC | PRN
  Administered 2018-11-04: 4 mg via INTRAVENOUS

## 2018-11-04 MED ORDER — PRENATAL MULTIVITAMIN CH
1.0000 | ORAL_TABLET | Freq: Every day | ORAL | Status: DC
  Administered 2018-11-05: 1 via ORAL
  Filled 2018-11-04: qty 1

## 2018-11-04 MED ORDER — DOCUSATE SODIUM 100 MG PO CAPS
100.0000 mg | ORAL_CAPSULE | Freq: Two times a day (BID) | ORAL | Status: DC
  Administered 2018-11-04 – 2018-11-05 (×2): 100 mg via ORAL
  Filled 2018-11-04 (×2): qty 1

## 2018-11-04 MED ORDER — OXYTOCIN BOLUS FROM INFUSION
500.0000 mL | Freq: Once | INTRAVENOUS | Status: AC
  Administered 2018-11-04: 500 mL via INTRAVENOUS

## 2018-11-04 MED ORDER — IBUPROFEN 600 MG PO TABS
600.0000 mg | ORAL_TABLET | Freq: Four times a day (QID) | ORAL | Status: DC
  Administered 2018-11-04 – 2018-11-05 (×4): 600 mg via ORAL
  Filled 2018-11-04 (×4): qty 1

## 2018-11-04 MED ORDER — WITCH HAZEL-GLYCERIN EX PADS: 1.0000 "application " | MEDICATED_PAD | CUTANEOUS | Status: DC | PRN

## 2018-11-04 MED ORDER — FERROUS SULFATE 325 (65 FE) MG PO TABS
325.0000 mg | ORAL_TABLET | Freq: Every day | ORAL | Status: DC
  Administered 2018-11-05: 08:00:00 325 mg via ORAL
  Filled 2018-11-04: qty 1

## 2018-11-04 MED ORDER — TERBUTALINE SULFATE 1 MG/ML IJ SOLN: 0.2500 mg | Freq: Once | INTRAMUSCULAR | Status: DC | PRN

## 2018-11-04 MED ORDER — DIBUCAINE (PERIANAL) 1 % EX OINT: 1.0000 "application " | TOPICAL_OINTMENT | CUTANEOUS | Status: DC | PRN

## 2018-11-04 MED ORDER — OXYCODONE-ACETAMINOPHEN 5-325 MG PO TABS: 2.0000 | ORAL_TABLET | ORAL | Status: DC | PRN

## 2018-11-04 MED ORDER — OXYTOCIN 10 UNIT/ML IJ SOLN
INTRAMUSCULAR | Status: AC
  Filled 2018-11-04: qty 2

## 2018-11-04 MED ORDER — FENTANYL 2.5 MCG/ML W/ROPIVACAINE 0.15% IN NS 100 ML EPIDURAL (ARMC)
EPIDURAL | Status: DC | PRN
  Administered 2018-11-04: 12 mL/h via EPIDURAL

## 2018-11-04 MED ORDER — LACTATED RINGERS IV SOLN
INTRAVENOUS | Status: DC
  Administered 2018-11-04 (×2): via INTRAVENOUS

## 2018-11-04 MED ORDER — OXYTOCIN 40 UNITS IN NORMAL SALINE INFUSION - SIMPLE MED
2.5000 [IU]/h | INTRAVENOUS | Status: DC
  Administered 2018-11-04 (×2): 2.5 [IU]/h via INTRAVENOUS
  Filled 2018-11-04: qty 1000

## 2018-11-04 MED ORDER — MISOPROSTOL 200 MCG PO TABS
ORAL_TABLET | ORAL | Status: AC
  Filled 2018-11-04: qty 4

## 2018-11-04 MED ORDER — METHYLERGONOVINE MALEATE 0.2 MG PO TABS: 0.2000 mg | ORAL_TABLET | ORAL | Status: DC | PRN

## 2018-11-04 MED ORDER — SENNOSIDES-DOCUSATE SODIUM 8.6-50 MG PO TABS
2.0000 | ORAL_TABLET | ORAL | Status: DC
  Administered 2018-11-05: 2 via ORAL
  Filled 2018-11-04: qty 2

## 2018-11-04 MED ORDER — OXYCODONE-ACETAMINOPHEN 5-325 MG PO TABS: 1.0000 | ORAL_TABLET | ORAL | Status: DC | PRN

## 2018-11-04 MED ORDER — BUTORPHANOL TARTRATE 1 MG/ML IJ SOLN: 1.0000 mg | INTRAMUSCULAR | Status: DC | PRN

## 2018-11-04 MED ORDER — SIMETHICONE 80 MG PO CHEW: 80.0000 mg | CHEWABLE_TABLET | ORAL | Status: DC | PRN

## 2018-11-04 MED ORDER — LIDOCAINE HCL (PF) 1 % IJ SOLN: 30.0000 mL | INTRAMUSCULAR | Status: DC | PRN

## 2018-11-04 MED ORDER — METHYLERGONOVINE MALEATE 0.2 MG/ML IJ SOLN
INTRAMUSCULAR | Status: AC
  Filled 2018-11-04: qty 1

## 2018-11-04 MED ORDER — COCONUT OIL OIL: 1.0000 "application " | TOPICAL_OIL | Status: DC | PRN

## 2018-11-04 MED ORDER — OXYTOCIN 40 UNITS IN NORMAL SALINE INFUSION - SIMPLE MED
1.0000 m[IU]/min | INTRAVENOUS | Status: DC
  Administered 2018-11-04: 08:00:00 4 m[IU]/min via INTRAVENOUS
  Filled 2018-11-04: qty 1000

## 2018-11-04 MED ORDER — EPHEDRINE 5 MG/ML INJ
10.0000 mg | INTRAVENOUS | Status: DC | PRN
  Filled 2018-11-04: qty 2

## 2018-11-04 MED ORDER — FENTANYL-BUPIVACAINE-NACL 0.5-0.125-0.9 MG/250ML-% EP SOLN: 12.0000 mL/h | EPIDURAL | Status: DC | PRN

## 2018-11-04 MED ORDER — BENZOCAINE-MENTHOL 20-0.5 % EX AERO: 1.0000 "application " | INHALATION_SPRAY | CUTANEOUS | Status: DC | PRN

## 2018-11-04 MED ORDER — FENTANYL 2.5 MCG/ML W/ROPIVACAINE 0.15% IN NS 100 ML EPIDURAL (ARMC)
EPIDURAL | Status: AC
  Filled 2018-11-04: qty 100

## 2018-11-04 NOTE — Progress Notes (Signed)
Pt G2P1 [redacted]w[redacted]d presents for scheduled IOL. Pt denies vaginal bleeding, leaking of fluid, states positive fetal movement. External monitors applied and assessing. VS WNL. A.Thompson, CNM notified and orders placed for pt. Will continue to assess pt.

## 2018-11-04 NOTE — Progress Notes (Signed)
LABOR NOTE   Janet Thompson 28 y.o.@ at [redacted]w[redacted]d Active phase labor.  SUBJECTIVE:  Comfortable with epidural. Denies urge to push at this time.  OBJECTIVE:  BP 119/76   Pulse 79   Temp 97.8 F (36.6 C) (Oral)   Resp 16   Ht 5\' 8"  (1.727 m)   Wt 73.5 kg   LMP 02/08/2018   SpO2 100%   BMI 24.63 kg/m  No intake/output data recorded.  She has shown cervical change. CERVIX: 6.5 cm:  90%:   -2:   mid position:   moderate SVE:   Dilation: 5 Effacement (%): 90 Station: -2, -1 Exam by:: Bennett Ram CNM CONTRACTIONS: regular, every 2-3 minutes FHR: Fetal heart tracing reviewed. Baseline: 130 bpm, Variability: Good {> 6 bpm), Accelerations: Reactive and Decelerations: irregular variables Category II   Analgesia: Epidural  Labs: Lab Results  Component Value Date   WBC 8.2 11/04/2018   HGB 11.6 (L) 11/04/2018   HCT 33.8 (L) 11/04/2018   MCV 89.7 11/04/2018   PLT 141 (L) 11/04/2018    ASSESSMENT: 1) Labor curve reviewed.       Progress: Active phase labor.     Membranes: ruptured, clear fluid     Blood show present       Active Problems:   Labor and delivery, indication for care   PLAN: continue present management   Philip Aspen, CNM  11/04/2018 11:50 AM

## 2018-11-04 NOTE — Anesthesia Procedure Notes (Signed)
Epidural Patient location during procedure: OB Start time: 11/04/2018 9:44 AM End time: 11/04/2018 10:00 AM  Staffing Anesthesiologist: Emmie Niemann, MD Performed: anesthesiologist   Preanesthetic Checklist Completed: patient identified, site marked, surgical consent, pre-op evaluation, timeout performed, IV checked, risks and benefits discussed and monitors and equipment checked  Epidural Patient position: sitting Prep: ChloraPrep Patient monitoring: heart rate, continuous pulse ox and blood pressure Approach: midline Location: L3-L4 Injection technique: LOR saline  Needle:  Needle type: Tuohy  Needle gauge: 18 G Needle length: 9 cm and 9 Needle insertion depth: 5 cm Catheter type: closed end flexible Catheter size: 20 Guage Catheter at skin depth: 9 cm Test dose: negative (0.125% bupivacaine)  Assessment Events: blood not aspirated, injection not painful, no injection resistance, negative IV test and no paresthesia  Additional Notes   Patient tolerated the insertion well without complications.Reason for block:procedure for pain

## 2018-11-04 NOTE — H&P (Signed)
History and Physical   HPI  Janet Thompson is a 28 y.o. G2P1001 at [redacted]w[redacted]d Estimated Date of Delivery: 11/11/18 who is being admitted for elective induction of labor.   OB History  OB History  Gravida Para Term Preterm AB Living  2 1 1  0 0 1  SAB TAB Ectopic Multiple Live Births  0 0 0 0 1    # Outcome Date GA Lbr Len/2nd Weight Sex Delivery Anes PTL Lv  2 Current           1 Term 06/24/15 [redacted]w[redacted]d 10:50 / 01:39 3760 g M Vag-Spont EPI  LIV     Name: PRESCAVAGE,BOY Vassie     Apgar1: 8  Apgar5: 9    PROBLEM LIST  Pregnancy complications or risks: Patient Active Problem List   Diagnosis Date Noted  . Labor and delivery, indication for care 11/04/2018  . Placenta previa antepartum in second trimester 05/07/2018  . Migraines 08/17/2017    Prenatal labs and studies: ABO, Rh: --/--/A POS (05/07 0845) Antibody: NEG (05/07 0845) Rubella: 1.34 (10/17 1054) RPR: Non Reactive (02/25 0858)  HBsAg: Negative (10/17 1054)  HIV: Non Reactive (10/17 1054)  HYQ:MVHQIONG (04/16 1202)   Past Medical History:  Diagnosis Date  . Frequent headaches   . History of fainting spells of unknown cause   . Menstrual irregularity   . Migraines   . Positive QuantiFERON-TB Gold test      Past Surgical History:  Procedure Laterality Date  . WISDOM TOOTH EXTRACTION       Medications    Current Discharge Medication List    CONTINUE these medications which have NOT CHANGED   Details  docusate sodium (COLACE) 100 MG capsule Take 1 capsule (100 mg total) by mouth 2 (two) times daily. Qty: 10 capsule, Refills: 0    magnesium oxide (MAG-OX) 400 (241.3 Mg) MG tablet Take 1 tablet (400 mg total) by mouth daily. Qty: 60 tablet, Refills: 1    pantoprazole (PROTONIX) 20 MG tablet Take 1 tablet (20 mg total) by mouth at bedtime. Qty: 30 tablet, Refills: 2    Prenatal Vit-Fe Fumarate-FA (PRENATAL MULTIVITAMIN) TABS tablet Take 1 tablet by mouth daily at 12 noon.          Allergies  Patient has no known allergies.  Review of Systems  Constitutional: negative Eyes: negative Ears, nose, mouth, throat, and face: negative Respiratory: negative Cardiovascular: negative Gastrointestinal: negative Genitourinary:negative Integument/breast: negative Hematologic/lymphatic: negative Musculoskeletal:negative Neurological: negative Behavioral/Psych: negative Endocrine: negative Allergic/Immunologic: negative  Physical Exam  BP 119/76   Pulse 79   Temp 98.2 F (36.8 C) (Oral)   Resp 16   Ht 5\' 8"  (1.727 m)   Wt 73.5 kg   LMP 02/08/2018   SpO2 100%   BMI 24.63 kg/m   Lungs:  CTA B Cardio: RRR  Abd: Soft, gravid, NT Presentation: cephalic EXT: No C/C/ 1+ Edema DTRs: 2+ B CERVIX:  5/90/-2   See Prenatal records for more detailed PE.     FHR:  Baseline: 140 bpm, Variability: Good {> 6 bpm), Accelerations: Reactive and Decelerations: Absent  Toco: Uterine Contractions: Frequency: Every 2-4 minutes, Duration: 70-90 seconds and Intensity: mild to moderate.   Test Results  Results for orders placed or performed during the hospital encounter of 11/04/18 (from the past 24 hour(s))  CBC     Status: Abnormal   Collection Time: 11/04/18  7:28 AM  Result Value Ref Range   WBC 8.2 4.0 - 10.5 K/uL  RBC 3.77 (L) 3.87 - 5.11 MIL/uL   Hemoglobin 11.6 (L) 12.0 - 15.0 g/dL   HCT 33.8 (L) 36.0 - 46.0 %   MCV 89.7 80.0 - 100.0 fL   MCH 30.8 26.0 - 34.0 pg   MCHC 34.3 30.0 - 36.0 g/dL   RDW 12.6 11.5 - 15.5 %   Platelets 141 (L) 150 - 400 K/uL   nRBC 0.0 0.0 - 0.2 %  Type and screen     Status: None   Collection Time: 11/04/18  8:45 AM  Result Value Ref Range   ABO/RH(D) A POS    Antibody Screen NEG    Sample Expiration      11/07/2018,2359 Performed at [facility], [address] [place], [place] [address]    Group B Strep negative  Assessment   G2P1001 at [redacted]w[redacted]d Estimated Date of Delivery: 11/11/18  The fetus is  reassuring.   Patient Active Problem List   Diagnosis Date Noted  . Labor and delivery, indication for care 11/04/2018  . Placenta previa antepartum in second trimester 05/07/2018  . Migraines 08/17/2017    Plan  1. Admitted to L&D :   IV Pitocin induction and AROM-clear fluid 2. EFM:-- Category 1 3. Epidural if desired-placed 4. Admission labs completed 5. Anticipate NSVD  Philip Aspen, CNM  11/04/2018 10:56 AM

## 2018-11-04 NOTE — Anesthesia Preprocedure Evaluation (Signed)
Anesthesia Evaluation  Patient identified by MRN, date of birth, ID band Patient awake    Reviewed: Allergy & Precautions, NPO status , Patient's Chart, lab work & pertinent test results  History of Anesthesia Complications Negative for: history of anesthetic complications  Airway Mallampati: II  TM Distance: >3 FB Neck ROM: Full    Dental no notable dental hx.    Pulmonary neg pulmonary ROS, neg sleep apnea, neg COPD,    breath sounds clear to auscultation- rhonchi (-) wheezing      Cardiovascular Exercise Tolerance: Good (-) hypertension(-) CAD and (-) Past MI  Rhythm:Regular Rate:Normal - Systolic murmurs and - Diastolic murmurs    Neuro/Psych  Headaches, negative psych ROS   GI/Hepatic negative GI ROS, Neg liver ROS,   Endo/Other  negative endocrine ROSneg diabetes  Renal/GU negative Renal ROS     Musculoskeletal negative musculoskeletal ROS (+)   Abdominal Gravid abdomen  Peds  Hematology negative hematology ROS (+)   Anesthesia Other Findings   Reproductive/Obstetrics (+) Pregnancy                             Anesthesia Physical Anesthesia Plan  ASA: II  Anesthesia Plan: Epidural   Post-op Pain Management:    Induction:   PONV Risk Score and Plan: 2  Airway Management Planned:   Additional Equipment:   Intra-op Plan:   Post-operative Plan:   Informed Consent: I have reviewed the patients History and Physical, chart, labs and discussed the procedure including the risks, benefits and alternatives for the proposed anesthesia with the patient or authorized representative who has indicated his/her understanding and acceptance.       Plan Discussed with: CRNA and Anesthesiologist  Anesthesia Plan Comments: (Plan for epidural for labor, discussed epidural vs spinal vs GA if need for csection)        Anesthesia Quick Evaluation

## 2018-11-05 LAB — CBC
HCT: 29.5 % — ABNORMAL LOW (ref 36.0–46.0)
Hemoglobin: 10 g/dL — ABNORMAL LOW (ref 12.0–15.0)
MCH: 31 pg (ref 26.0–34.0)
MCHC: 33.9 g/dL (ref 30.0–36.0)
MCV: 91.3 fL (ref 80.0–100.0)
Platelets: 151 10*3/uL (ref 150–400)
RBC: 3.23 MIL/uL — ABNORMAL LOW (ref 3.87–5.11)
RDW: 12.5 % (ref 11.5–15.5)
WBC: 11.6 10*3/uL — ABNORMAL HIGH (ref 4.0–10.5)
nRBC: 0 % (ref 0.0–0.2)

## 2018-11-05 LAB — RPR: RPR Ser Ql: NONREACTIVE

## 2018-11-05 MED ORDER — NORETHIN-ETH ESTRAD-FE BIPHAS 1 MG-10 MCG / 10 MCG PO TABS: 1.0000 | ORAL_TABLET | Freq: Every day | ORAL | 11 refills | Status: DC

## 2018-11-05 NOTE — Progress Notes (Signed)
Discharge instructions, prescriptions, education, and appointments given and explained. Pt verbalized understanding with no further questions. Pt wheeled to personal vehicle by staff accompanied by husband.

## 2018-11-05 NOTE — Discharge Summary (Signed)
Discharge Summary  Date of Admission: 11/04/2018  Date of Discharge: 11/05/2018  Admitting Diagnosis: Induction of labor at [redacted]w[redacted]d  Mode of Delivery: normal spontaneous vaginal delivery             Discharge Diagnosis: No other diagnosis   Intrapartum Procedures: pitocin augmentation   Post partum procedures: None  Complications: none                      Discharge Day SOAP Note:  Progress Note - Vaginal Delivery  Alyne Martinson is a 28 y.o. [id number] now PP day 1 s/p Vaginal, Spontaneous . Delivery was uncomplicated  Subjective  The patient has the following complaints: has no unusual complaints  Pain is controlled with current medications.   Patient is urinating without difficulty.  She is ambulating well.    Objective  Vital signs: BP 95/74 (BP Location: Left Arm)   Pulse 80   Temp 98 F (36.7 C) (Oral)   Resp 18   Ht 5\' 8"  (1.727 m)   Wt 73.5 kg   LMP 02/08/2018   SpO2 99%   Breastfeeding Unknown   BMI 24.63 kg/m   Physical Exam: Gen: NAD Fundus Fundal Tone: Firm @u -1  Lochia Amount: Small  Perineum Appearance: Intact     Data Review Labs: CBC Latest Ref Rng & Units 11/05/2018 11/04/2018 08/24/2018  WBC 4.0 - 10.5 K/uL 11.6(H) 8.2 8.9  Hemoglobin 12.0 - 15.0 g/dL 10.0(L) 11.6(L) 11.4  Hematocrit 36.0 - 46.0 % 29.5(L) 33.8(L) 33.2(L)  Platelets 150 - 400 K/uL 151 141(L) 171   A POS  Assessment/Plan  Active Problems:   Labor and delivery, indication for care    Plan for discharge today.   Discharge Instructions: Per After Visit Summary. Activity: Advance as tolerated. Pelvic rest for 6 weeks.  Also refer to After Visit Summary Diet: Regular Medications: Allergies as of 11/05/2018   No Known Allergies     Medication List    TAKE these medications   docusate sodium 100 MG capsule Commonly known as:  Colace Take 1 capsule (100 mg total) by mouth 2 (two) times daily.   magnesium oxide 400 (241.3 Mg) MG tablet Commonly  known as:  MAG-OX Take 1 tablet (400 mg total) by mouth daily.   Norethindrone-Ethinyl Estradiol-Fe Biphas 1 MG-10 MCG / 10 MCG tablet Commonly known as:  LO LOESTRIN FE Take 1 tablet by mouth daily. Start 4-6 wks postpartum   pantoprazole 20 MG tablet Commonly known as:  Protonix Take 1 tablet (20 mg total) by mouth at bedtime.   prenatal multivitamin Tabs tablet Take 1 tablet by mouth daily at 12 noon.      Outpatient follow up: 6 wks postpartum with Deneise Lever  Postpartum contraception: Lo loestrin pill start 4-6 wks pp  Discharged Condition: good  Discharged to: home  Newborn Data: Disposition:home with mother  Apgars: APGAR (1 MIN): 8   APGAR (5 MINS): 9   APGAR (10 MINS):    Baby Feeding: Bottle    Philip Aspen, CNM  11/05/2018 12:36 PM

## 2018-11-05 NOTE — Final Progress Note (Signed)
Discharge Day SOAP Note:  Progress Note - Vaginal Delivery  Selene Peltzer is a 28 y.o. [id number] now PP day 1 s/p Vaginal, Spontaneous . Delivery was uncomplicated  Subjective  The patient has the following complaints: has no unusual complaints  Pain is controlled with current medications.   Patient is urinating without difficulty.  She is ambulating well.    Objective  Vital signs: BP 95/74 (BP Location: Left Arm)   Pulse 80   Temp 98 F (36.7 C) (Oral)   Resp 18   Ht 5\' 8"  (1.727 m)   Wt 73.5 kg   LMP 02/08/2018   SpO2 99%   Breastfeeding Unknown   BMI 24.63 kg/m   Physical Exam: Gen: NAD Fundus Fundal Tone: Firm @u -1  Lochia Amount: Small  Perineum Appearance: Intact     Data Review Labs: CBC Latest Ref Rng & Units 11/05/2018 11/04/2018 08/24/2018  WBC 4.0 - 10.5 K/uL 11.6(H) 8.2 8.9  Hemoglobin 12.0 - 15.0 g/dL 10.0(L) 11.6(L) 11.4  Hematocrit 36.0 - 46.0 % 29.5(L) 33.8(L) 33.2(L)  Platelets 150 - 400 K/uL 151 141(L) 171   A POS  Assessment/Plan  Active Problems:   Labor and delivery, indication for care    Plan for discharge today.   Discharge Instructions: Per After Visit Summary. Activity: Advance as tolerated. Pelvic rest for 6 weeks.  Also refer to After Visit Summary Diet: Regular Medications: Allergies as of 11/05/2018   No Known Allergies     Medication List    TAKE these medications   docusate sodium 100 MG capsule Commonly known as:  Colace Take 1 capsule (100 mg total) by mouth 2 (two) times daily.   magnesium oxide 400 (241.3 Mg) MG tablet Commonly known as:  MAG-OX Take 1 tablet (400 mg total) by mouth daily.   Norethindrone-Ethinyl Estradiol-Fe Biphas 1 MG-10 MCG / 10 MCG tablet Commonly known as:  LO LOESTRIN FE Take 1 tablet by mouth daily. Start 4-6 wks postpartum   pantoprazole 20 MG tablet Commonly known as:  Protonix Take 1 tablet (20 mg total) by mouth at bedtime.   prenatal multivitamin Tabs tablet Take 1 tablet  by mouth daily at 12 noon.      Outpatient follow up: 6 wks postpartum with Deneise Lever  Postpartum contraception: Lo loestrin pill start 4-6 wks pp  Discharged Condition: good  Discharged to: home  Newborn Data: Disposition:home with mother  Apgars: APGAR (1 MIN): 8   APGAR (5 MINS): 9   APGAR (10 MINS):    Baby Feeding: Bottle    Philip Aspen, CNM  11/05/2018 12:36 PM

## 2018-11-05 NOTE — Anesthesia Postprocedure Evaluation (Signed)
Anesthesia Post Note  Patient: Gerlean Cid  Procedure(s) Performed: AN AD [place]  Patient location during evaluation: Mother Baby Anesthesia Type: Epidural Level of consciousness: awake, awake and alert and oriented Pain management: pain level controlled Vital Signs Assessment: post-procedure vital signs reviewed and stable Respiratory status: spontaneous breathing, nonlabored ventilation and respiratory function stable Cardiovascular status: blood pressure returned to baseline and stable Postop Assessment: no headache and no backache Anesthetic complications: no     Last Vitals:  Vitals:   11/04/18 2313 11/05/18 0305  BP: (!) 98/57 114/77  Pulse: 77 87  Resp: 20 20  Temp: 36.9 C 36.6 C  SpO2: 100% 100%    Last Pain:  Vitals:   11/05/18 0305  TempSrc: Oral  PainSc:                  Johnna Acosta

## 2018-11-10 ENCOUNTER — Other Ambulatory Visit: Payer: No Typology Code available for payment source

## 2018-11-10 ENCOUNTER — Encounter: Payer: No Typology Code available for payment source | Admitting: Obstetrics and Gynecology

## 2018-11-11 ENCOUNTER — Telehealth: Payer: Self-pay | Admitting: Certified Nurse Midwife

## 2018-11-11 NOTE — Telephone Encounter (Signed)
Patient called stating even with the coupon card LO LO is still expensive. She would like something that would be cheap or even no cost. Thanks

## 2018-11-12 ENCOUNTER — Other Ambulatory Visit: Payer: Self-pay | Admitting: Certified Nurse Midwife

## 2018-11-12 MED ORDER — NORETHIN ACE-ETH ESTRAD-FE 1-20 MG-MCG PO TABS: 1.0000 | ORAL_TABLET | Freq: Every day | ORAL | 11 refills | Status: DC

## 2018-11-12 NOTE — Progress Notes (Signed)
Pt request different b/c due to cost. Orders placed.   Philip Aspen, CNM

## 2018-11-12 NOTE — Telephone Encounter (Signed)
I sent in for Loestrin Fe, generic to [facility]. This should be cheaper.  Can you let her know.   Thanks,  Deneise Lever

## 2018-11-13 MED FILL — BLISOVI FE 1/20 1-20 MG-MCG: 1-20 | 84 days supply | Qty: 84 | Fill #0

## 2018-12-03 ENCOUNTER — Other Ambulatory Visit: Payer: Self-pay

## 2018-12-03 ENCOUNTER — Encounter: Payer: Self-pay | Admitting: Family Medicine

## 2018-12-03 ENCOUNTER — Ambulatory Visit (INDEPENDENT_AMBULATORY_CARE_PROVIDER_SITE_OTHER): Payer: No Typology Code available for payment source | Admitting: Family Medicine

## 2018-12-03 DIAGNOSIS — G43809 Other migraine, not intractable, without status migrainosus: Secondary | ICD-10-CM

## 2018-12-03 MED ORDER — SUMATRIPTAN SUCCINATE 100 MG PO TABS: 100.0000 mg | ORAL_TABLET | Freq: Once | ORAL | 2 refills | Status: DC

## 2018-12-03 NOTE — Progress Notes (Signed)
Virtual Visit via Video Note  I connected with Janet Thompson   on 12/03/18 at 11:30 AM EDT by a video enabled telemedicine application and verified that I am speaking with the correct person using two identifiers.  Location patient: home Location provider:work office Persons participating in the virtual visit: patient, provider  I discussed the limitations of evaluation and management by telemedicine and the availability of in person appointments. The patient expressed understanding and agreed to proceed.   Janet Thompson DOB: [birth year] Encounter date: 12/03/2018  This is a 28 y.o. female who presents to establish care. Chief Complaint  Patient presents with  . Establish Care    History of present illness:  Just had second son on May 11th.  Doing pretty well; staying busy. Just a little tired.   Before pregnancy didn't have headaches as much. Now getting headache a couple of times/week. Usually takes motrin if needed and this typically works. If migraine coming on she will take excedrin. This usually takes care of migraine. Sometimes will have migraine that lasts a couple of days. Has tried preventative in past, has tried trigger point injections. Thinks stress related sometimes. Just feels somewhat used to headache.   Does get light sensitivity with bad migraines. Uses ice, covers eyes, tries to fall asleep. Bad migraines can last a couple of days - intensity will fade somewhat but still bothersome enough that she isn't resting well.   Feels like she does ok with sleep, but interrupted.   Past Medical History:  Diagnosis Date  . Frequent headaches   . History of fainting spells of unknown cause   . Menstrual irregularity   . Migraines   . Placenta previa antepartum in second trimester 05/07/2018  . Positive QuantiFERON-TB Gold test    Past Surgical History:  Procedure Laterality Date  . WISDOM TOOTH EXTRACTION     No Known Allergies Current Meds  Medication Sig  .  loratadine (CLARITIN) 10 MG tablet Take 10 mg by mouth daily.  . magnesium oxide (MAG-OX) 400 (241.3 Mg) MG tablet Take 1 tablet (400 mg total) by mouth daily.  . Multiple Vitamin (MULTIVITAMIN) capsule Take 1 capsule by mouth daily.  . norethindrone-ethinyl estradiol (LOESTRIN FE) 1-20 MG-MCG tablet Take 1 tablet by mouth daily.   Social History   Tobacco Use  . Smoking status: Never Smoker  . Smokeless tobacco: Never Used  Substance Use Topics  . Alcohol use: Yes    Comment: occasional   Family History  Problem Relation Age of Onset  . Colon cancer Maternal Aunt 27  . Alcohol abuse Maternal Grandfather        colon  . Diabetes Paternal Grandmother   . Healthy Mother        heavy menses; early hysterectomy  . High blood pressure Father      Review of Systems  Constitutional: Negative for chills, fatigue and fever.  Respiratory: Negative for cough, chest tightness, shortness of breath and wheezing.   Cardiovascular: Negative for chest pain, palpitations and leg swelling.    Objective:  There were no vitals taken for this visit.      BP Readings from Last 3 Encounters:  11/05/18 95/74  11/03/18 125/78  10/27/18 122/82   Wt Readings from Last 3 Encounters:  11/04/18 162 lb (73.5 kg)  11/03/18 162 lb (73.5 kg)  10/27/18 159 lb 5 oz (72.3 kg)    EXAM:  GENERAL: alert, oriented, appears well and in no acute distress  HEENT: atraumatic,  conjunctiva clear, no obvious abnormalities on inspection of external nose and ears  NECK: normal movements of the head and neck  LUNGS: on inspection no signs of respiratory distress, breathing rate appears normal, no obvious gross SOB, gasping or wheezing  CV: no obvious cyanosis  MS: moves all visible extremities without noticeable abnormality  PSYCH/NEURO: pleasant and cooperative, no obvious depression or anxiety, speech and thought processing grossly intact  SKIN: no facial or neck abnormallities.  Assessment/Plan  1.  Other migraine without status migrainosus, not intractable I will Imitrex for more severe migraines.  Let me know if this is not working as we have multiple options for migraine treatment.  Would certainly consider referral to headache center if patient desires due to her chronic migraine history. - SUMAtriptan (IMITREX) 100 MG tablet; Take 1 tablet (100 mg total) by mouth once for 1 dose. May repeat in 2 hours if headache persists or recurs.  Dispense: 10 tablet; Refill: 2    I discussed the assessment and treatment plan with the patient. The patient was provided an opportunity to ask questions and all were answered. The patient agreed with the plan and demonstrated an understanding of the instructions.   The patient was advised to call back or seek an in-person evaluation if the symptoms worsen or if the condition fails to improve as anticipated.  I provided 20 minutes of non-face-to-face time during this encounter.   Micheline Rough, MD

## 2018-12-07 ENCOUNTER — Telehealth: Payer: Self-pay | Admitting: *Deleted

## 2018-12-07 NOTE — Telephone Encounter (Signed)
I called the pt and she stated she has a physical already scheduled for 8/5.

## 2018-12-07 NOTE — Telephone Encounter (Signed)
-----   Message from Caren Macadam, MD sent at 12/03/2018 12:16 PM EDT ----- Please schedule physical for December. Thanks!

## 2018-12-14 ENCOUNTER — Telehealth: Payer: Self-pay

## 2018-12-14 NOTE — Telephone Encounter (Signed)
Coronavirus (COVID-19) Are you at risk?  Are you at risk for the Coronavirus (COVID-19)?  To be considered HIGH RISK for Coronavirus (COVID-19), you have to meet the following criteria:  . Traveled to [place], [place], [place], [place] or [place]; or in the [place] to [place], [place], [place], or [place]; and have fever, cough, and shortness of breath within the last 2 weeks of travel OR . Been in close contact with a person diagnosed with COVID-19 within the last 2 weeks and have fever, cough, and shortness of breath . IF YOU DO NOT MEET THESE CRITERIA, YOU ARE CONSIDERED LOW RISK FOR COVID-19.  What to do if you are HIGH RISK for COVID-19?  Marland Kitchen If you are having a medical emergency, call 911. . Seek medical care right away. Before you go to a doctor's office, urgent care or emergency department, call ahead and tell them about your recent travel, contact with someone diagnosed with COVID-19, and your symptoms. You should receive instructions from your physician's office regarding next steps of care.  . When you arrive at healthcare provider, tell the healthcare staff immediately you have returned from visiting [place], [place], [place], [place] or [place]; or traveled in the [place] to [place], [place], [place], or [place]; in the last two weeks or you have been in close contact with a person diagnosed with COVID-19 in the last 2 weeks.   . Tell the health care staff about your symptoms: fever, cough and shortness of breath. . After you have been seen by a medical provider, you will be either: o Tested for (COVID-19) and discharged home on quarantine except to seek medical care if symptoms worsen, and asked to  - Stay home and avoid contact with others until you get your results (4-5 days)  - Avoid travel on public transportation if possible (such as bus, train, or airplane) or o Sent to the Emergency Department by EMS for evaluation, COVID-19 testing, and possible  admission depending on your condition and test results.  What to do if you are LOW RISK for COVID-19?  Reduce your risk of any infection by using the same precautions used for avoiding the common cold or flu:  Marland Kitchen Wash your hands often with soap and warm water for at least 20 seconds.  If soap and water are not readily available, use an alcohol-based hand sanitizer with at least 60% alcohol.  . If coughing or sneezing, cover your mouth and nose by coughing or sneezing into the elbow areas of your shirt or coat, into a tissue or into your sleeve (not your hands). . Avoid shaking hands with others and consider head nods or verbal greetings only. . Avoid touching your eyes, nose, or mouth with unwashed hands.  . Avoid close contact with people who are Janet Thompson. . Avoid places or events with large numbers of people in one location, like concerts or sporting events. . Carefully consider travel plans you have or are making. . If you are planning any travel outside or inside the [place], visit the CDC's Travelers' Health webpage for the latest health notices. . If you have some symptoms but not all symptoms, continue to monitor at home and seek medical attention if your symptoms worsen. . If you are having a medical emergency, call 911.  12/14/18 SCREENING NEG SLS ADDITIONAL HEALTHCARE OPTIONS FOR PATIENTS  [facility] / e-Visit: [email or web address]         [facility]: [phone]  [facility]: [phone]                   [facility]: [phone]  

## 2018-12-15 ENCOUNTER — Other Ambulatory Visit: Payer: Self-pay

## 2018-12-15 ENCOUNTER — Ambulatory Visit (INDEPENDENT_AMBULATORY_CARE_PROVIDER_SITE_OTHER): Payer: No Typology Code available for payment source | Admitting: Certified Nurse Midwife

## 2018-12-15 ENCOUNTER — Encounter: Payer: Self-pay | Admitting: Certified Nurse Midwife

## 2018-12-15 MED ORDER — NORGESTIMATE-ETH ESTRADIOL 0.25-35 MG-MCG PO TABS: 1.0000 | ORAL_TABLET | Freq: Every day | ORAL | 11 refills | Status: DC

## 2018-12-15 MED FILL — FEMYNOR 0.25-35 MG-MCG TABS: 0.25-35 | 28 days supply | Qty: 28 | Fill #0

## 2018-12-15 NOTE — Progress Notes (Signed)
Subjective:    Janet Thompson is a 28 y.o. G22P2002 Caucasian female who presents for a postpartum visit. She is 6 weeks postpartum following a spontaneous vaginal delivery at 49 gestational weeks. Anesthesia: epidural. I have fully reviewed the prenatal and intrapartum course. Postpartum course has been WNL. Baby's course has been WNL. Baby is feeding by bottle. Bleeding thin lochia. Bowel function is normal. Bladder function is normal. Patient is not sexually active.. Contraception method is oral progesterone-only contraceptive. Postpartum depression screening: negative. Score 0.  Last pap 04/16/2016 and was negative.  The following portions of the patient's history were reviewed and updated as appropriate: allergies, current medications, past medical history, past surgical history and problem list.  Review of Systems Pertinent items are noted in HPI.   Vitals:   12/15/18 1059  BP: 135/85  Pulse: 80  Weight: 149 lb 1 oz (67.6 kg)  Height: 5\' 8"  (1.727 m)   Patient's last menstrual period was 12/05/2018 (exact date).  Objective:   General:  alert, cooperative and no distress   Breasts:  deferred, no complaints  Lungs: clear to auscultation bilaterally  Heart:  regular rate and rhythm  Abdomen: soft, nontender   Vulva: normal  Vagina: normal vagina  Cervix:  closed  Corpus: Well-involuted  Adnexa:  Non-palpable  Rectal Exam: no hemorrhoids        Assessment:   Postpartum exam 6 wks s/p SVD Bottle feeding Depression screening Contraception counseling   Plan:  : oral progesterone-only contraceptive, pt state she is having so heavier bleeding and would like to change the pill. Order placed for sprintec .  Labs today CBC.  Follow up in: 6 months for annual exam/pap smear or earlier if needed  Philip Aspen, CNM

## 2018-12-15 NOTE — Patient Instructions (Addendum)
Preventive Care 18-39 Years, Female Preventive care refers to lifestyle choices and visits with your health care provider that can promote health and wellness. What does preventive care include?   A yearly physical exam. This is also called an annual well check.  Dental exams once or twice a year.  Routine eye exams. Ask your health care provider how often you should have your eyes checked.  Personal lifestyle choices, including: ? Daily care of your teeth and gums. ? Regular physical activity. ? Eating a healthy diet. ? Avoiding tobacco and drug use. ? Limiting alcohol use. ? Practicing safe sex. ? Taking vitamin and mineral supplements as recommended by your health care provider. What happens during an annual well check? The services and screenings done by your health care provider during your annual well check will depend on your age, overall health, lifestyle risk factors, and family history of disease. Counseling Your health care provider may ask you questions about your:  Alcohol use.  Tobacco use.  Drug use.  Emotional well-being.  Home and relationship well-being.  Sexual activity.  Eating habits.  Work and work environment.  Method of birth control.  Menstrual cycle.  Pregnancy history. Screening You may have the following tests or measurements:  Height, weight, and BMI.  Diabetes screening. This is done by checking your blood sugar (glucose) after you have not eaten for a while (fasting).  Blood pressure.  Lipid and cholesterol levels. These may be checked every 5 years starting at age 20.  Skin check.  Hepatitis C blood test.  Hepatitis B blood test.  Sexually transmitted disease (STD) testing.  BRCA-related cancer screening. This may be done if you have a family history of breast, ovarian, tubal, or peritoneal cancers.  Pelvic exam and Pap test. This may be done every 3 years starting at age 21. Starting at age 30, this may be done every 5  years if you have a Pap test in combination with an HPV test. Discuss your test results, treatment options, and if necessary, the need for more tests with your health care provider. Vaccines Your health care provider may recommend certain vaccines, such as:  Influenza vaccine. This is recommended every year.  Tetanus, diphtheria, and acellular pertussis (Tdap, Td) vaccine. You may need a Td booster every 10 years.  Varicella vaccine. You may need this if you have not been vaccinated.  HPV vaccine. If you are 26 or younger, you may need three doses over 6 months.  Measles, mumps, and rubella (MMR) vaccine. You may need at least one dose of MMR. You may also need a second dose.  Pneumococcal 13-valent conjugate (PCV13) vaccine. You may need this if you have certain conditions and were not previously vaccinated.  Pneumococcal polysaccharide (PPSV23) vaccine. You may need one or two doses if you smoke cigarettes or if you have certain conditions.  Meningococcal vaccine. One dose is recommended if you are age 19-21 years and a first-year college student living in a residence hall, or if you have one of several medical conditions. You may also need additional booster doses.  Hepatitis A vaccine. You may need this if you have certain conditions or if you travel or work in places where you may be exposed to hepatitis A.  Hepatitis B vaccine. You may need this if you have certain conditions or if you travel or work in places where you may be exposed to hepatitis B.  Haemophilus influenzae type b (Hib) vaccine. You may need this if you   have certain risk factors. Talk to your health care provider about which screenings and vaccines you need and how often you need them. This information is not intended to replace advice given to you by your health care provider. Make sure you discuss any questions you have with your health care provider. Document Released: 08/12/2001 Document Revised: 01/27/2017  Document Reviewed: 04/17/2015 Elsevier Interactive Patient Education  2019 Elsevier In

## 2018-12-16 LAB — CBC
Hematocrit: 34.3 % (ref 34.0–46.6)
Hemoglobin: 11.2 g/dL (ref 11.1–15.9)
MCH: 29.6 pg (ref 26.6–33.0)
MCHC: 32.7 g/dL (ref 31.5–35.7)
MCV: 91 fL (ref 79–97)
Platelets: 248 10*3/uL (ref 150–450)
RBC: 3.79 x10E6/uL (ref 3.77–5.28)
RDW: 12.9 % (ref 11.7–15.4)
WBC: 7.7 10*3/uL (ref 3.4–10.8)

## 2018-12-17 ENCOUNTER — Encounter: Payer: No Typology Code available for payment source | Admitting: Certified Nurse Midwife

## 2019-01-11 ENCOUNTER — Encounter: Payer: No Typology Code available for payment source | Admitting: Obstetrics and Gynecology

## 2019-01-24 MED FILL — FEMYNOR 0.25-35 MG-MCG TABS: 0.25-35 | 84 days supply | Qty: 84 | Fill #1

## 2019-02-02 ENCOUNTER — Encounter: Payer: No Typology Code available for payment source | Admitting: Family Medicine

## 2019-02-02 ENCOUNTER — Other Ambulatory Visit: Payer: Self-pay

## 2019-02-02 NOTE — Progress Notes (Deleted)
Janet Thompson DOB: March 01, 1991 Encounter date: 02/02/2019  This is a 28 y.o. female who presents for complete physical   History of present illness/Additional concerns: Has second son may 11th.  Migraine:imitrex trial prescribed at last visit.  ***  Past Medical History:  Diagnosis Date  . Frequent headaches   . History of fainting spells of unknown cause   . Menstrual irregularity   . Migraines   . Placenta previa antepartum in second trimester 05/07/2018  . Positive QuantiFERON-TB Gold test    Past Surgical History:  Procedure Laterality Date  . WISDOM TOOTH EXTRACTION     No Known Allergies No outpatient medications have been marked as taking for the 02/02/19 encounter (Appointment) with Caren Macadam, MD.   Social History   Tobacco Use  . Smoking status: Never Smoker  . Smokeless tobacco: Never Used  Substance Use Topics  . Alcohol use: Yes    Comment: occasional   Family History  Problem Relation Age of Onset  . Colon cancer Maternal Aunt 51  . Alcohol abuse Maternal Grandfather        colon  . Diabetes Paternal Grandmother   . Healthy Mother        heavy menses; early hysterectomy  . High blood pressure Father      Review of Systems  CBC:  Lab Results  Component Value Date   WBC 7.7 12/15/2018   WBC 11.6 (H) 11/05/2018   HGB 11.2 12/15/2018   HCT 34.3 12/15/2018   MCH 29.6 12/15/2018   MCH 31.0 11/05/2018   MCHC 32.7 12/15/2018   MCHC 33.9 11/05/2018   RDW 12.9 12/15/2018   PLT 248 12/15/2018   CMP: Lab Results  Component Value Date   NA 142 04/16/2016   K 4.5 04/16/2016   CL 107 04/16/2016   CO2 29 04/16/2016   GLUCOSE 91 04/16/2016   BUN 12 04/16/2016   CREATININE 0.66 04/16/2016   CALCIUM 9.6 04/16/2016   PROT 7.3 04/16/2016   BILITOT 0.8 04/16/2016   ALKPHOS 68 04/16/2016   ALT 16 04/16/2016   AST 16 04/16/2016   LIPID: Lab Results  Component Value Date   CHOL 143 11/16/2017   TRIG 69.0 11/16/2017   HDL 70.60  11/16/2017   LDLCALC 58 11/16/2017    Objective:  There were no vitals taken for this visit.      BP Readings from Last 3 Encounters:  12/15/18 135/85  11/05/18 95/74  11/03/18 125/78   Wt Readings from Last 3 Encounters:  12/15/18 149 lb 1 oz (67.6 kg)  11/04/18 162 lb (73.5 kg)  11/03/18 162 lb (73.5 kg)    Physical Exam  Assessment/Plan: Health Maintenance Due  Topic Date Due  . PAP SMEAR-Modifier  12/21/2018  . INFLUENZA VACCINE  01/29/2019   Health Maintenance reviewed - {health maintenance:315237}.  There are no diagnoses linked to this encounter.  No follow-ups on file.  Micheline Rough, MD

## 2019-03-21 ENCOUNTER — Encounter: Payer: Self-pay | Admitting: Family Medicine

## 2019-03-21 ENCOUNTER — Ambulatory Visit (INDEPENDENT_AMBULATORY_CARE_PROVIDER_SITE_OTHER): Payer: No Typology Code available for payment source | Admitting: Family Medicine

## 2019-03-21 VITALS — BP 100/80 | HR 73 | Temp 98.2°F | Ht 68.0 in | Wt 145.0 lb

## 2019-03-21 DIAGNOSIS — Z13220 Encounter for screening for lipoid disorders: Secondary | ICD-10-CM | POA: Diagnosis not present

## 2019-03-21 DIAGNOSIS — Z0000 Encounter for general adult medical examination without abnormal findings: Secondary | ICD-10-CM | POA: Diagnosis not present

## 2019-03-21 DIAGNOSIS — R5383 Other fatigue: Secondary | ICD-10-CM

## 2019-03-21 DIAGNOSIS — G43809 Other migraine, not intractable, without status migrainosus: Secondary | ICD-10-CM | POA: Diagnosis not present

## 2019-03-21 DIAGNOSIS — Z131 Encounter for screening for diabetes mellitus: Secondary | ICD-10-CM

## 2019-03-21 DIAGNOSIS — Z23 Encounter for immunization: Secondary | ICD-10-CM

## 2019-03-21 LAB — CBC WITH DIFFERENTIAL/PLATELET
Basophils Absolute: 0.1 10*3/uL (ref 0.0–0.1)
Basophils Relative: 0.8 % (ref 0.0–3.0)
Eosinophils Absolute: 0.1 10*3/uL (ref 0.0–0.7)
Eosinophils Relative: 1.5 % (ref 0.0–5.0)
HCT: 38.9 % (ref 36.0–46.0)
Hemoglobin: 13 g/dL (ref 12.0–15.0)
Lymphocytes Relative: 24 % (ref 12.0–46.0)
Lymphs Abs: 1.7 10*3/uL (ref 0.7–4.0)
MCHC: 33.3 g/dL (ref 30.0–36.0)
MCV: 86 fl (ref 78.0–100.0)
Monocytes Absolute: 0.3 10*3/uL (ref 0.1–1.0)
Monocytes Relative: 3.8 % (ref 3.0–12.0)
Neutro Abs: 4.8 10*3/uL (ref 1.4–7.7)
Neutrophils Relative %: 69.9 % (ref 43.0–77.0)
Platelets: 275 10*3/uL (ref 150.0–400.0)
RBC: 4.53 Mil/uL (ref 3.87–5.11)
RDW: 13.6 % (ref 11.5–15.5)
WBC: 6.9 10*3/uL (ref 4.0–10.5)

## 2019-03-21 LAB — COMPREHENSIVE METABOLIC PANEL
ALT: 21 U/L (ref 0–35)
AST: 17 U/L (ref 0–37)
Albumin: 4.6 g/dL (ref 3.5–5.2)
Alkaline Phosphatase: 59 U/L (ref 39–117)
BUN: 8 mg/dL (ref 6–23)
CO2: 28 mEq/L (ref 19–32)
Calcium: 9.9 mg/dL (ref 8.4–10.5)
Chloride: 104 mEq/L (ref 96–112)
Creatinine, Ser: 0.61 mg/dL (ref 0.40–1.20)
GFR: 114.74 mL/min (ref >60.00)
Glucose, Bld: 80 mg/dL (ref 70–99)
Potassium: 4.5 mEq/L (ref 3.5–5.1)
Sodium: 140 mEq/L (ref 135–145)
Total Bilirubin: 0.9 mg/dL (ref 0.2–1.2)
Total Protein: 7.2 g/dL (ref 6.0–8.3)

## 2019-03-21 LAB — LIPID PANEL
Cholesterol: 149 mg/dL (ref 0–200)
HDL: 68.6 mg/dL (ref >39.00)
LDL Cholesterol: 66 mg/dL (ref 0–99)
NonHDL: 80.5
Total CHOL/HDL Ratio: 2
Triglycerides: 73 mg/dL (ref 0.0–149.0)
VLDL: 14.6 mg/dL (ref 0.0–40.0)

## 2019-03-21 LAB — TSH: TSH: 0.89 u[IU]/mL (ref 0.35–4.50)

## 2019-03-21 MED ORDER — VENLAFAXINE HCL ER 37.5 MG PO CP24: 37.5000 mg | ORAL_CAPSULE | Freq: Every day | ORAL | 1 refills | Status: DC

## 2019-03-21 NOTE — Progress Notes (Signed)
Janet Thompson DOB: Jul 15, 1990 Encounter date: 03/21/2019  This is a 28 y.o. female who presents for complete physical   History of present illness/Additional concerns:  Still getting headaches. Having a "decent headache" at least once a week. Not sure what triggers are. Deals with parental and work stressors ok; nothing over the top. Infant is sleeping well. Eating healthy. 1 cup of caffeine daily. Not sure what else she can do or change. Feels like she always has some sort of headache. First time she took imitrex she was able to take nap after and headache was gone when she woke up. Worked again second time. Then took it a week ago and wasn't able to sleep. Makes headache go away. Does have a neck sensation when she takes medication. Thought hormonal because didn't get headaches when pregnant first time, but second pregnancy did have more headaches/similar to now. Good about staying well hydrated.   In early 20's was on low dose (she thinks beta blocker); tried trigger point injections; tried to avoid food triggers that she was given.   Most of time goes to bed with headache and then wakes up with worse headache. When she feels migraine - drinks a lot of water, eats a little meal, takes medication. She feels like she has just gone into this headache cycle.     Past Medical History:  Diagnosis Date  . Frequent headaches   . History of fainting spells of unknown cause   . Menstrual irregularity   . Migraines   . Placenta previa antepartum in second trimester 05/07/2018  . Positive QuantiFERON-TB Gold test    Past Surgical History:  Procedure Laterality Date  . WISDOM TOOTH EXTRACTION     No Known Allergies Current Meds  Medication Sig  . loratadine (CLARITIN) 10 MG tablet Take 10 mg by mouth daily.  . magnesium oxide (MAG-OX) 400 (241.3 Mg) MG tablet Take 1 tablet (400 mg total) by mouth daily.  . Multiple Vitamin (MULTIVITAMIN) capsule Take 1 capsule by mouth daily.  .  norgestimate-ethinyl estradiol (SPRINTEC 28) 0.25-35 MG-MCG tablet Take 1 tablet by mouth daily.   Social History   Tobacco Use  . Smoking status: Never Smoker  . Smokeless tobacco: Never Used  Substance Use Topics  . Alcohol use: Yes    Comment: occasional   Family History  Problem Relation Age of Onset  . Colon cancer Maternal Aunt 16  . Alcohol abuse Maternal Grandfather        colon  . Diabetes Paternal Grandmother   . Healthy Mother        heavy menses; early hysterectomy  . High blood pressure Father      Review of Systems  Constitutional: Negative for activity change, appetite change, chills, fatigue, fever and unexpected weight change.  HENT: Negative for congestion, ear pain, hearing loss, sinus pressure, sinus pain, sore throat and trouble swallowing.   Eyes: Negative for pain and visual disturbance.  Respiratory: Negative for cough, chest tightness, shortness of breath and wheezing.   Cardiovascular: Negative for chest pain, palpitations and leg swelling.  Gastrointestinal: Negative for abdominal pain, blood in stool, constipation, diarrhea, nausea and vomiting.  Genitourinary: Negative for difficulty urinating and menstrual problem.  Musculoskeletal: Negative for arthralgias and back pain.  Skin: Negative for rash.  Neurological: Positive for headaches (see HPI). Negative for dizziness, weakness and numbness.  Hematological: Negative for adenopathy. Does not bruise/bleed easily.  Psychiatric/Behavioral: Negative for sleep disturbance and suicidal ideas. The patient is not nervous/anxious.  CBC:  Lab Results  Component Value Date   WBC 6.9 03/21/2019   HGB 13.0 03/21/2019   HGB 11.2 12/15/2018   HCT 38.9 03/21/2019   HCT 34.3 12/15/2018   MCH 29.6 12/15/2018   MCH 31.0 11/05/2018   MCHC 33.3 03/21/2019   RDW 13.6 03/21/2019   RDW 12.9 12/15/2018   PLT 275.0 03/21/2019   PLT 248 12/15/2018   CMP: Lab Results  Component Value Date   NA 140  03/21/2019   K 4.5 03/21/2019   CL 104 03/21/2019   CO2 28 03/21/2019   GLUCOSE 80 03/21/2019   BUN 8 03/21/2019   CREATININE 0.61 03/21/2019   CALCIUM 9.9 03/21/2019   PROT 7.2 03/21/2019   BILITOT 0.9 03/21/2019   ALKPHOS 59 03/21/2019   ALT 21 03/21/2019   AST 17 03/21/2019   LIPID: Lab Results  Component Value Date   CHOL 149 03/21/2019   TRIG 73.0 03/21/2019   HDL 68.60 03/21/2019   LDLCALC 66 03/21/2019    Objective:  BP 100/80 (BP Location: Left Arm, Patient Position: Sitting, Cuff Size: Normal)   Pulse 73   Temp 98.2 F (36.8 C) (Temporal)   Ht 5\' 8"  (1.727 m)   Wt 145 lb (65.8 kg)   LMP 02/20/2019 (Approximate)   SpO2 98%   BMI 22.05 kg/m   Weight: 145 lb (65.8 kg)   BP Readings from Last 3 Encounters:  03/21/19 100/80  12/15/18 135/85  11/05/18 95/74   Wt Readings from Last 3 Encounters:  03/21/19 145 lb (65.8 kg)  12/15/18 149 lb 1 oz (67.6 kg)  11/04/18 162 lb (73.5 kg)    Physical Exam Constitutional:      General: She is not in acute distress.    Appearance: She is well-developed.  HENT:     Head: Normocephalic and atraumatic.     Right Ear: External ear normal.     Left Ear: External ear normal.     Mouth/Throat:     Pharynx: No oropharyngeal exudate.  Eyes:     Conjunctiva/sclera: Conjunctivae normal.     Pupils: Pupils are equal, round, and reactive to light.  Neck:     Musculoskeletal: Normal range of motion and neck supple.     Thyroid: No thyromegaly.  Cardiovascular:     Rate and Rhythm: Normal rate and regular rhythm.     Heart sounds: Normal heart sounds. No murmur. No friction rub. No gallop.   Pulmonary:     Effort: Pulmonary effort is normal.     Breath sounds: Normal breath sounds.  Abdominal:     General: Bowel sounds are normal. There is no distension.     Palpations: Abdomen is soft. There is no mass.     Tenderness: There is no abdominal tenderness. There is no guarding.     Hernia: No hernia is present.   Musculoskeletal: Normal range of motion.        General: No tenderness or deformity.  Lymphadenopathy:     Cervical: No cervical adenopathy.  Skin:    General: Skin is warm and dry.     Findings: No rash.  Neurological:     Mental Status: She is alert and oriented to person, place, and time.     Deep Tendon Reflexes: Reflexes normal.     Reflex Scores:      Tricep reflexes are 2+ on the right side and 2+ on the left side.      Bicep reflexes are 2+ on  the right side and 2+ on the left side.      Brachioradialis reflexes are 2+ on the right side and 2+ on the left side.      Patellar reflexes are 2+ on the right side and 2+ on the left side. Psychiatric:        Speech: Speech normal.        Behavior: Behavior normal.        Thought Content: Thought content normal.     Assessment/Plan: There are no preventive care reminders to display for this patient. Health Maintenance reviewed.  1. Preventative health care Keep up with regular exercise and healthy eating.   2. Other migraine without status migrainosus, not intractable We are going to continue Mg, add B2, and start effexor. We discussed other preventative options including beta blocker (hesitant due to already low bp) and topamax (hesitant due to side effect profile). We will have her increase to 75mg  as tolerated and she will update before needing refill. Will plan office visit in about 3 mo to recheck. Discussed new medication(s) today with patient. Discussed potential side effects and patient verbalized understanding.  - Magnesium, RBC; Future - venlafaxine XR (EFFEXOR XR) 37.5 MG 24 hr capsule; Take 1 capsule (37.5 mg total) by mouth daily.  Dispense: 90 capsule; Refill: 1 - Magnesium, RBC  3. Other fatigue - CBC with Differential/Platelet; Future - Comprehensive metabolic panel; Future - TSH; Future - Vitamin B12; Future - VITAMIN D 25 Hydroxy (Vit-D Deficiency, Fractures); Future - VITAMIN D 25 Hydroxy (Vit-D  Deficiency, Fractures) - Vitamin B12 - TSH - Comprehensive metabolic panel - CBC with Differential/Platelet  4. Lipid screening - Lipid panel; Future - Lipid panel  5. Screening for diabetes mellitus labwork today.  6. Need for immunization against influenza - Flu Vaccine QUAD 6+ mos PF IM (Fluarix Quad PF)  Return in about 3 months (around 06/20/2019) for Chronic condition visit - doxy is fine.  Micheline Rough, MD

## 2019-03-21 NOTE — Patient Instructions (Addendum)
Consider vitamin B2 (riboflavin) 400mg  daily to help with headache prevention.   Start with effexor 37.5mg  daily. If tolerating ok; can increase to 75mg  (2 tabs) after 2 weeks.   Update me through mychart in about 1-2 months before refill is due so we can change dosage for effexor if needed.

## 2019-03-22 LAB — VITAMIN B12: Vitamin B-12: 284 pg/mL (ref 211–911)

## 2019-03-22 LAB — VITAMIN D 25 HYDROXY (VIT D DEFICIENCY, FRACTURES): VITD: 40.79 ng/mL (ref 30.00–100.00)

## 2019-03-23 LAB — MAGNESIUM, RBC: Magnesium RBC: 4.9 mg/dL (ref 4.0–6.4)

## 2019-04-01 MED FILL — FEMYNOR 0.25-35 MG-MCG TABS: 0.25-35 | 84 days supply | Qty: 84 | Fill #2

## 2019-04-07 ENCOUNTER — Encounter: Payer: Self-pay | Admitting: Family Medicine

## 2019-04-07 ENCOUNTER — Other Ambulatory Visit: Payer: Self-pay | Admitting: Family Medicine

## 2019-04-07 DIAGNOSIS — G43809 Other migraine, not intractable, without status migrainosus: Secondary | ICD-10-CM

## 2019-04-07 MED ORDER — VENLAFAXINE HCL ER 75 MG PO CP24: 75.0000 mg | ORAL_CAPSULE | Freq: Every day | ORAL | 1 refills | Status: DC

## 2019-04-08 MED FILL — VENLAFAXINE HCL ER 75 MG CA: 75 | 90 days supply | Qty: 90 | Fill #0

## 2019-04-15 ENCOUNTER — Encounter: Payer: No Typology Code available for payment source | Admitting: Obstetrics and Gynecology

## 2019-04-20 IMAGING — US US OB COMP LESS 14 WK
1 series · 14 of 28 positions shown · non-contrast
Comparison: None.

CLINICAL DATA: Vaginal bleeding.

EXAM:
OBSTETRIC <14 WK US AND TRANSVAGINAL OB US
TECHNIQUE: Both transabdominal and transvaginal ultrasound examinations were
performed for complete evaluation of the gestation as well as the
maternal uterus, adnexal regions, and pelvic cul-de-sac.
Transvaginal technique was performed to assess early pregnancy.

[Series 1: us ob comp less 14 wk · 14 of 52 slices shown]
[im 2/52]
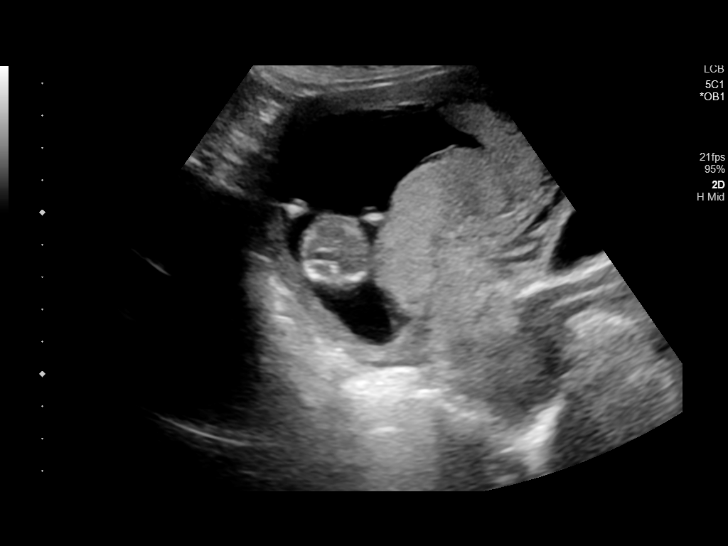
[im 6/52]
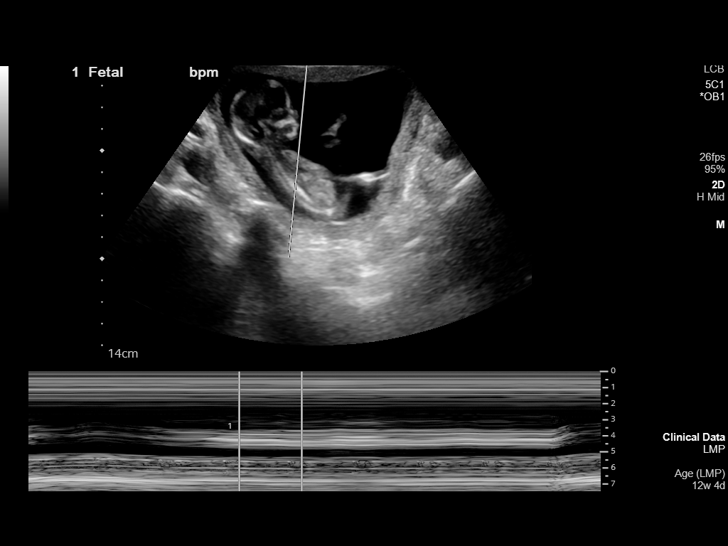
[im 10/52]
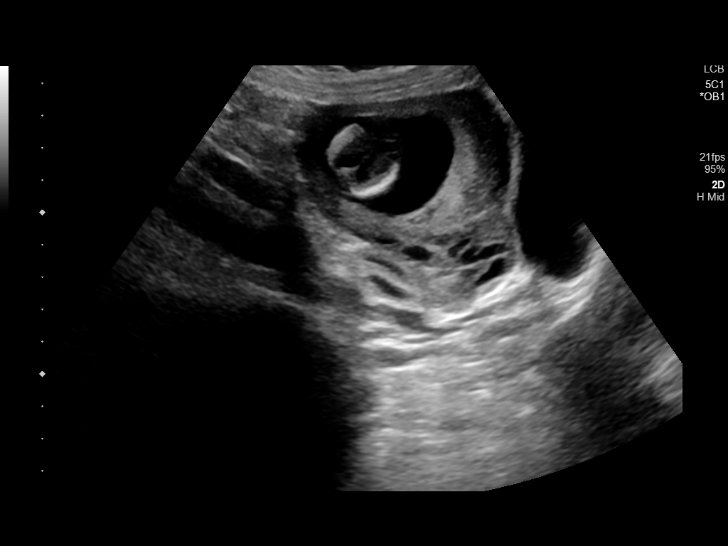
[im 14/52]
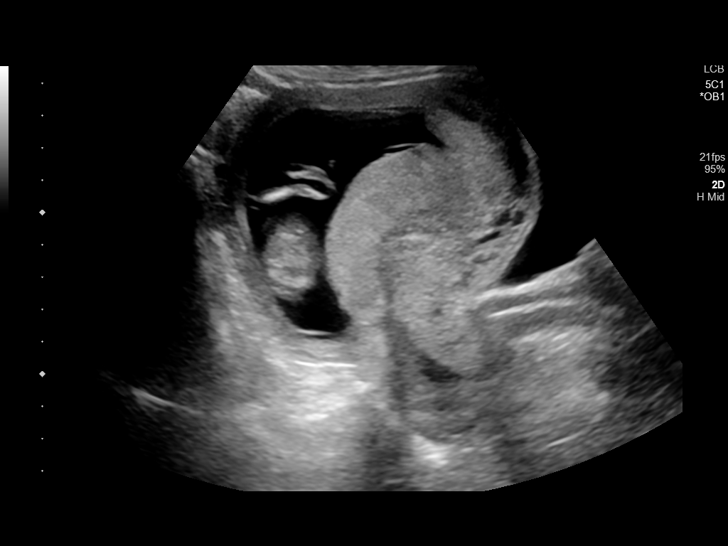
[im 18/52]
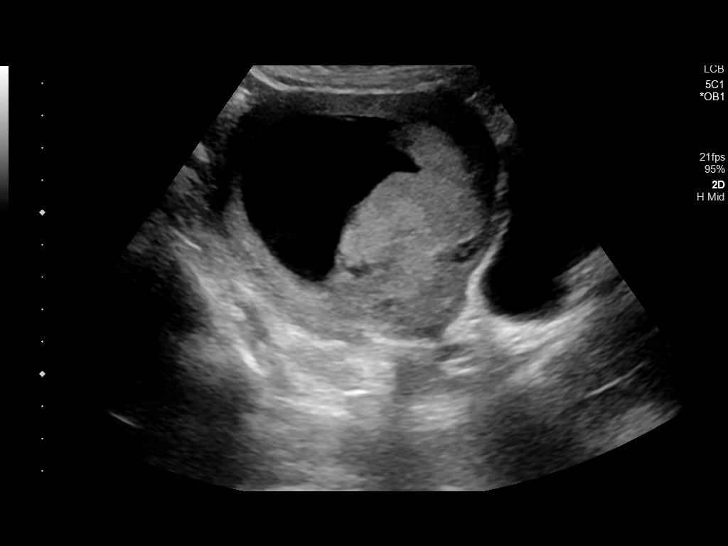
[im 21/52]
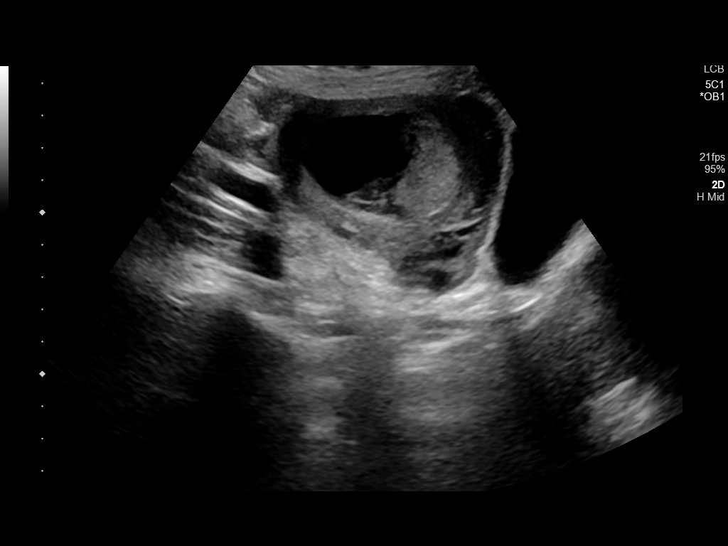
[im 25/52]
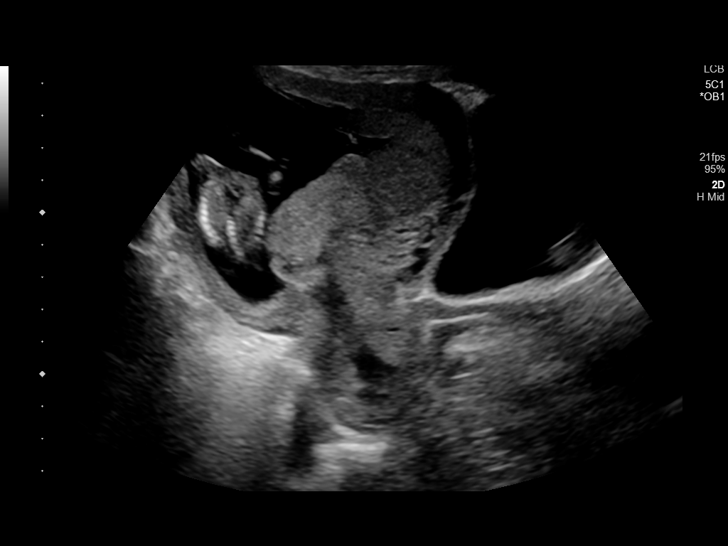
[im 29/52]
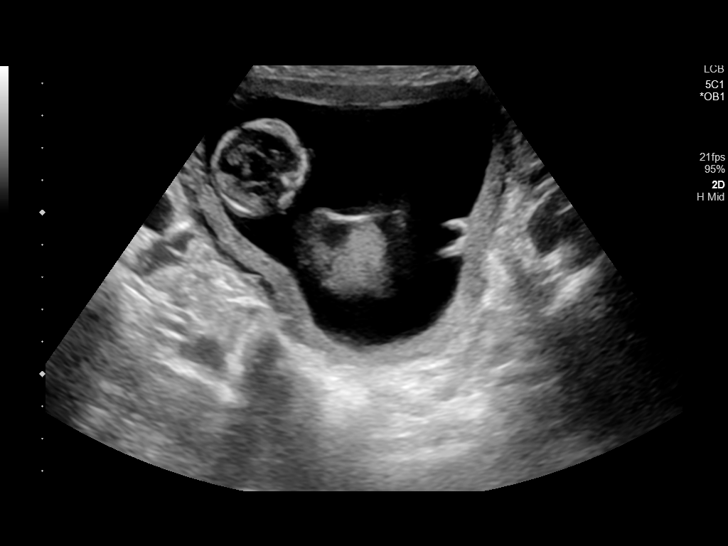
[im 33/52]
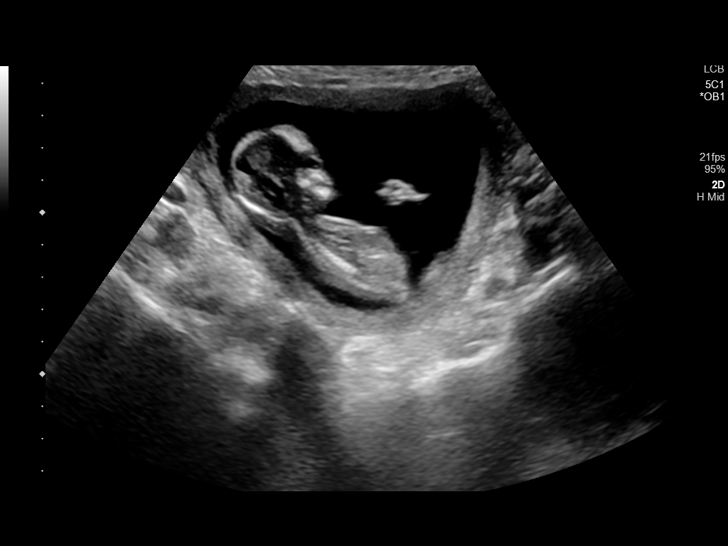
[im 36/52]
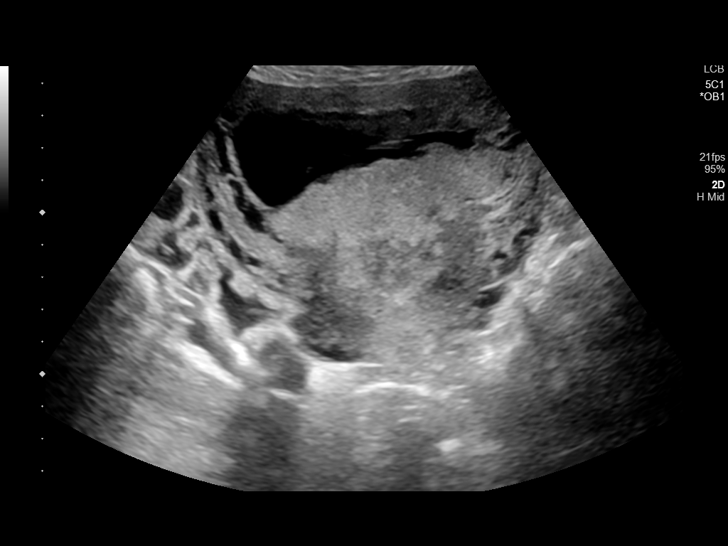
[im 40/52]
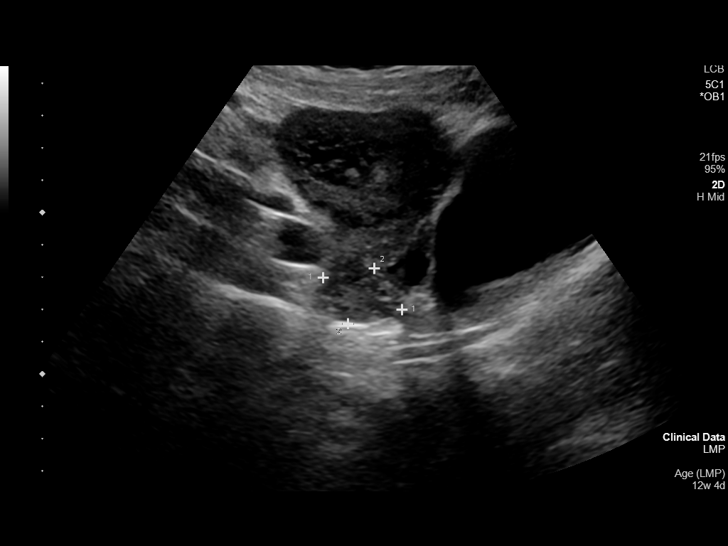
[im 44/52]
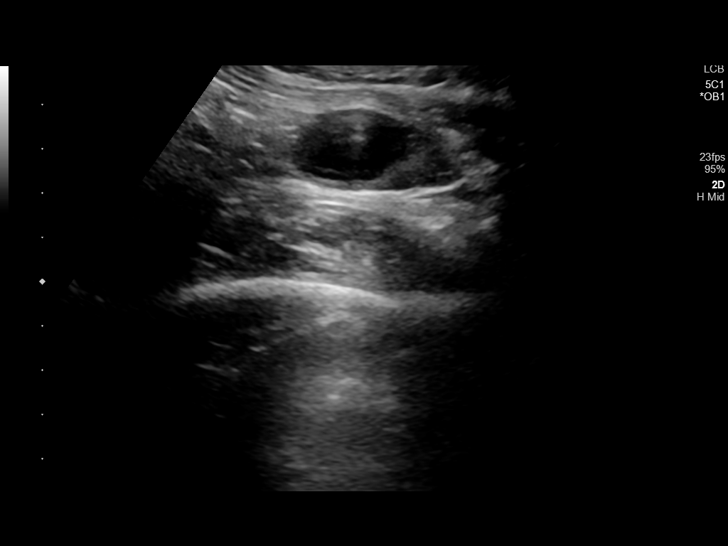
[im 48/52]
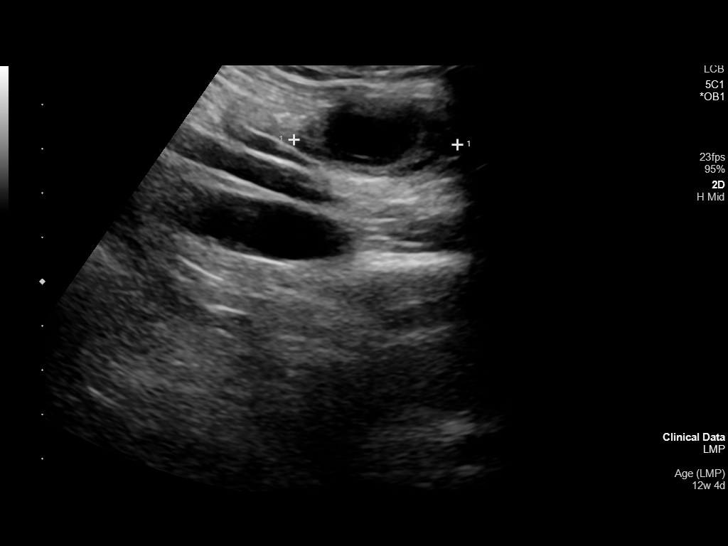
[im 52/52]
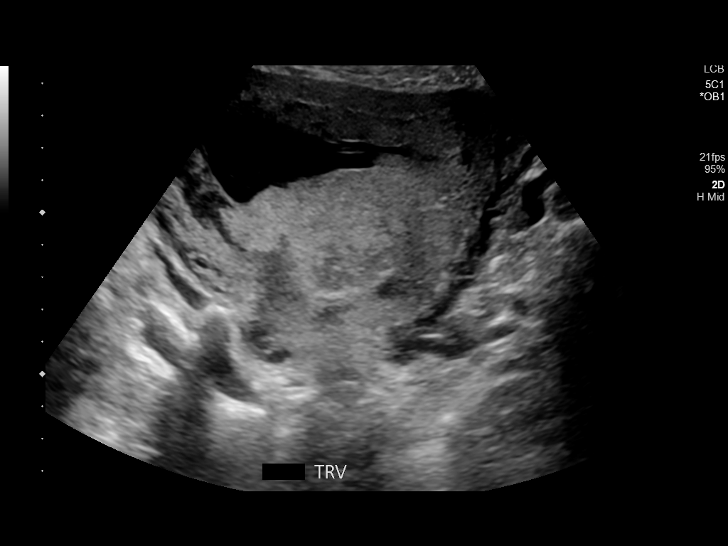

[14 of 28 positions shown; findings below may reference images not displayed]

FINDINGS: Intrauterine gestational sac: Single

Yolk sac:  Not Visualized.

Embryo:  Visualized.

Cardiac Activity: Visualized.

Heart Rate: 163 bpm

CRL:  72.9 mm   13 w   3 d                  US EDC: 11/09/2018

Subchorionic hemorrhage:  None visualized.

Maternal uterus/adnexae: Small left ovarian cyst measuring 2.5 x
x 2 cm without a mural nodule or septation. Otherwise normal
bilateral ovaries. Anterior placenta with placenta previa. Small
amount of blood products in the cervical os.
IMPRESSION: 1. Single live intrauterine pregnancy as detailed above.
2. Anterior placenta with placenta previa. Recommend close clinical
and sonographic follow-up on subsequent exams. Small amount of blood
in the cervical os.

## 2019-04-22 ENCOUNTER — Encounter: Payer: No Typology Code available for payment source | Admitting: Obstetrics and Gynecology

## 2019-05-25 ENCOUNTER — Encounter: Payer: No Typology Code available for payment source | Admitting: Obstetrics and Gynecology

## 2019-06-03 ENCOUNTER — Other Ambulatory Visit: Payer: Self-pay | Admitting: Family Medicine

## 2019-06-03 ENCOUNTER — Encounter: Payer: Self-pay | Admitting: Family Medicine

## 2019-06-03 DIAGNOSIS — G43809 Other migraine, not intractable, without status migrainosus: Secondary | ICD-10-CM

## 2019-06-06 MED ORDER — SUMATRIPTAN SUCCINATE 100 MG PO TABS: 100.0000 mg | ORAL_TABLET | Freq: Once | ORAL | 2 refills | Status: DC

## 2019-06-17 ENCOUNTER — Other Ambulatory Visit: Payer: Self-pay

## 2019-06-20 ENCOUNTER — Encounter: Payer: Self-pay | Admitting: Family Medicine

## 2019-06-20 ENCOUNTER — Ambulatory Visit (INDEPENDENT_AMBULATORY_CARE_PROVIDER_SITE_OTHER): Payer: No Typology Code available for payment source | Admitting: Family Medicine

## 2019-06-20 ENCOUNTER — Ambulatory Visit (INDEPENDENT_AMBULATORY_CARE_PROVIDER_SITE_OTHER): Payer: No Typology Code available for payment source | Admitting: Certified Nurse Midwife

## 2019-06-20 ENCOUNTER — Other Ambulatory Visit: Payer: Self-pay

## 2019-06-20 ENCOUNTER — Encounter: Payer: Self-pay | Admitting: Certified Nurse Midwife

## 2019-06-20 ENCOUNTER — Other Ambulatory Visit (HOSPITAL_COMMUNITY)
Admission: RE | Admit: 2019-06-20 | Discharge: 2019-06-20 | Disposition: A | Discharge status: Home / Self Care | Payer: No Typology Code available for payment source | Source: Ambulatory Visit | Attending: Certified Nurse Midwife | Admitting: Certified Nurse Midwife

## 2019-06-20 VITALS — BP 119/76 | HR 86 | Ht 68.0 in | Wt 144.2 lb

## 2019-06-20 VITALS — Wt 150.0 lb

## 2019-06-20 DIAGNOSIS — G43809 Other migraine, not intractable, without status migrainosus: Secondary | ICD-10-CM | POA: Diagnosis not present

## 2019-06-20 DIAGNOSIS — Z01419 Encounter for gynecological examination (general) (routine) without abnormal findings: Secondary | ICD-10-CM | POA: Insufficient documentation

## 2019-06-20 MED ORDER — NORGESTIMATE-ETH ESTRADIOL 0.25-35 MG-MCG PO TABS: 1.0000 | ORAL_TABLET | Freq: Every day | ORAL | 11 refills | Status: DC

## 2019-06-20 NOTE — Progress Notes (Signed)
Virtual Visit via Video Note  I connected with Janet Thompson  on 06/20/19 at 11:30 AM EST by a video enabled telemedicine application and verified that I am speaking with the correct person using two identifiers.  Location patient: home Location provider:work or home office Persons participating in the virtual visit: patient, provider  I discussed the limitations of evaluation and management by telemedicine and the availability of in person appointments. The patient expressed understanding and agreed to proceed.   Janet Thompson DOB: [birth year] Encounter date: 06/20/2019  This is a 28 y.o. female who presents with Chief Complaint  Patient presents with  . Headache    patient states she still has recuurent headaches, more controlled recently    History of present illness: At last visit 9/21 we discussed headaches/migraines. We started effexor, continued Mg and added B2. She was instructed to increase to 75mg  as tolerated.    Feels like she can manage them more. Still getting them more, but baseline feeling of headache/fogginess is better. If she has a headache now she takes ibuprofen and if it doesn't go away she either can manage or will take the imitrex at night and it seems to be working. Still thinks more headaches than other people, but feels that she can get them away better. Feels like she takes an imitrex once or twice a week, but feels like this is due to her knowing this will make headache go away. Doesn't think headache would go away without the imitrex. If goes into bad headache then she does have light sensitivity and nauseated.   Have not been days where imitrex didn't make headache go away except one day last week where she did have more severe headache - but ended up taking imitrex 2 days back to back. Felt like she had a headache for a few days in a row and hasn't experienced that for awhile.   Daughter is now sleeping through the night; so feels this will help as  well with overall sleep.   She is going to work this year on healthier eating as well and avoiding migraine trigger foods.   No Known Allergies Current Meds  Medication Sig  . Cyanocobalamin (B-12 PO) Take 400 mg by mouth daily.  Marland Kitchen loratadine (CLARITIN) 10 MG tablet Take 10 mg by mouth daily as needed.   . magnesium oxide (MAG-OX) 400 (241.3 Mg) MG tablet Take 1 tablet (400 mg total) by mouth daily.  . Multiple Vitamin (MULTIVITAMIN) capsule Take 1 capsule by mouth daily.  . norgestimate-ethinyl estradiol (SPRINTEC 28) 0.25-35 MG-MCG tablet Take 1 tablet by mouth daily.  Marland Kitchen venlafaxine XR (EFFEXOR-XR) 75 MG 24 hr capsule Take 1 capsule (75 mg total) by mouth daily.    Review of Systems  Objective:  Wt 150 lb (68 kg)   LMP 06/13/2019 (Approximate)   BMI 22.81 kg/m   Weight: 150 lb (68 kg)   BP Readings from Last 3 Encounters:  03/21/19 100/80  12/15/18 135/85  11/05/18 95/74   Wt Readings from Last 3 Encounters:  06/20/19 150 lb (68 kg)  03/21/19 145 lb (65.8 kg)  12/15/18 149 lb 1 oz (67.6 kg)    EXAM:  GENERAL: alert, oriented, appears well and in no acute distress  HEENT: atraumatic, conjunctiva clear, no obvious abnormalities on inspection of external nose and ears  NECK: normal movements of the head and neck  LUNGS: on inspection no signs of respiratory distress, breathing rate appears normal, no obvious gross SOB, gasping  or wheezing  CV: no obvious cyanosis  MS: moves all visible extremities without noticeable abnormality  PSYCH/NEURO: pleasant and cooperative, no obvious depression or anxiety, speech and thought processing grossly intact   Assessment/Plan  1. Other migraine without status migrainosus, not intractable She has done better with current regimen. Really cut out the daily headache and has made treating the headaches easier/more efficient. We discussed options today as I feel she is still getting a lot of headaches, but since we have cut down  over 1/3 of headaches, we are going to keep meds the same and she is going to work on diet/controlling risk factors.   If she has another migraine that does not respond to imitrex, we will try maxalt as this seems to be next best covered med. We can then branch out into other options pending this response.   Return if symptoms worsen or fail to improve.    I discussed the assessment and treatment plan with the patient. The patient was provided an opportunity to ask questions and all were answered. The patient agreed with the plan and demonstrated an understanding of the instructions.   The patient was advised to call back or seek an in-person evaluation if the symptoms worsen or if the condition fails to improve as anticipated.  I provided 20 minutes of non-face-to-face time during this encounter.   Micheline Rough, MD

## 2019-06-20 NOTE — Patient Instructions (Signed)
Preventive Care 21-30 Years Old, Female Preventive care refers to visits with your health care provider and lifestyle choices that can promote health and wellness. This includes:  A yearly physical exam. This may also be called an annual well check.  Regular dental visits and eye exams.  Immunizations.  Screening for certain conditions.  Healthy lifestyle choices, such as eating a healthy diet, getting regular exercise, not using drugs or products that contain nicotine and tobacco, and limiting alcohol use. What can I expect for my preventive care visit? Physical exam Your health care provider will check your:  Height and weight. This may be used to calculate body mass index (BMI), which tells if you are at a healthy weight.  Heart rate and blood pressure.  Skin for abnormal spots. Counseling Your health care provider may ask you questions about your:  Alcohol, tobacco, and drug use.  Emotional well-being.  Home and relationship well-being.  Sexual activity.  Eating habits.  Work and work environment.  Method of birth control.  Menstrual cycle.  Pregnancy history. What immunizations do I need?  Influenza (flu) vaccine  This is recommended every year. Tetanus, diphtheria, and pertussis (Tdap) vaccine  You may need a Td booster every 10 years. Varicella (chickenpox) vaccine  You may need this if you have not been vaccinated. Human papillomavirus (HPV) vaccine  If recommended by your health care provider, you may need three doses over 6 months. Measles, mumps, and rubella (MMR) vaccine  You may need at least one dose of MMR. You may also need a second dose. Meningococcal conjugate (MenACWY) vaccine  One dose is recommended if you are age 19-21 years and a first-year college student living in a residence hall, or if you have one of several medical conditions. You may also need additional booster doses. Pneumococcal conjugate (PCV13) vaccine  You may need  this if you have certain conditions and were not previously vaccinated. Pneumococcal polysaccharide (PPSV23) vaccine  You may need one or two doses if you smoke cigarettes or if you have certain conditions. Hepatitis A vaccine  You may need this if you have certain conditions or if you travel or work in places where you may be exposed to hepatitis A. Hepatitis B vaccine  You may need this if you have certain conditions or if you travel or work in places where you may be exposed to hepatitis B. Haemophilus influenzae type b (Hib) vaccine  You may need this if you have certain conditions. You may receive vaccines as individual doses or as more than one vaccine together in one shot (combination vaccines). Talk with your health care provider about the risks and benefits of combination vaccines. What tests do I need?  Blood tests  Lipid and cholesterol levels. These may be checked every 5 years starting at age 20.  Hepatitis C test.  Hepatitis B test. Screening  Diabetes screening. This is done by checking your blood sugar (glucose) after you have not eaten for a while (fasting).  Sexually transmitted disease (STD) testing.  BRCA-related cancer screening. This may be done if you have a family history of breast, ovarian, tubal, or peritoneal cancers.  Pelvic exam and Pap test. This may be done every 3 years starting at age 21. Starting at age 30, this may be done every 5 years if you have a Pap test in combination with an HPV test. Talk with your health care provider about your test results, treatment options, and if necessary, the need for more tests.   Follow these instructions at home: Eating and drinking   Eat a diet that includes fresh fruits and vegetables, whole grains, lean protein, and low-fat dairy.  Take vitamin and mineral supplements as recommended by your health care provider.  Do not drink alcohol if: ? Your health care provider tells you not to drink. ? You are  pregnant, may be pregnant, or are planning to become pregnant.  If you drink alcohol: ? Limit how much you have to 0-1 drink a day. ? Be aware of how much alcohol is in your drink. In the [place] one drink equals one 12 oz bottle of beer (355 mL), one 5 oz glass of wine (148 mL), or one 1 oz glass of hard liquor (44 mL). Lifestyle  Take daily care of your teeth and gums.  Stay active. Exercise for at least 30 minutes on 5 or more days each week.  Do not use any products that contain nicotine or tobacco, such as cigarettes, e-cigarettes, and chewing tobacco. If you need help quitting, ask your health care provider.  If you are sexually active, practice safe sex. Use a condom or other form of birth control (contraception) in order to prevent pregnancy and STIs (sexually transmitted infections). If you plan to become pregnant, see your health care provider for a preconception visit. What's next?  Visit your health care provider once a year for a well check visit.  Ask your health care provider how often you should have your eyes and teeth checked.  Stay up to date on all vaccines. This information is not intended to replace advice given to you by your health care provider. Make sure you discuss any questions you have with your health care provider. Document Released: 08/12/2001 Document Revised: 02/25/2018 Document Reviewed: 02/25/2018 Elsevier Patient Education  2020 [employer]  

## 2019-06-20 NOTE — Progress Notes (Signed)
GYNECOLOGY ANNUAL PREVENTATIVE CARE ENCOUNTER NOTE  History:     Janet Thompson is a 28 y.o. [phone] female here for a routine annual gynecologic exam.  Current complaints: none.   Denies abnormal vaginal bleeding, discharge, pelvic pain, problems with intercourse or other gynecologic concerns.     Works outside of home Married 2 children (thomas/Sam) Exercises- has declined but plans to increase No smoking, drinking, or drugs  Gynecologic History Patient's last menstrual period was 06/13/2019 (approximate). Contraception: OCP (estrogen/progesterone) Last Pap: 12/21/2015. Results were: normal  Last mammogram: n/a  Obstetric History OB History  Gravida Para Term Preterm AB Living  2 2 2  0 0 2  SAB TAB Ectopic Multiple Live Births  0 0 0 0 2    # Outcome Date GA Lbr Len/2nd Weight Sex Delivery Anes PTL Lv  2 Term 11/04/18 [redacted]w[redacted]d / 00:31  M Vag-Spont EPI  LIV  1 Term 06/24/15 [redacted]w[redacted]d 10:50 / 01:39 8 lb 4.6 oz (3.76 kg) M Vag-Spont EPI  LIV    Past Medical History:  Diagnosis Date  . Frequent headaches   . History of fainting spells of unknown cause   . Menstrual irregularity   . Migraines   . Placenta previa antepartum in second trimester 05/07/2018  . 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 WISDOM TOOTH EXTRACTION      Current Outpatient Medications on File Prior to Visit  Medication Sig Dispense Refill  . loratadine (CLARITIN) 10 MG tablet Take 10 mg by mouth daily as needed.     . magnesium oxide (MAG-OX) 400 (241.3 Mg) MG tablet Take 1 tablet (400 mg total) by mouth daily. 60 tablet 1  . Multiple Vitamin (MULTIVITAMIN) capsule Take 1 capsule by mouth daily.    . norgestimate-ethinyl estradiol (SPRINTEC 28) 0.25-35 MG-MCG tablet Take 1 tablet by mouth daily. 1 Package 11  . venlafaxine XR (EFFEXOR-XR) 75 MG 24 hr capsule Take 1 capsule (75 mg total) by mouth daily. 90 capsule 1  . SUMAtriptan (IMITREX) 100 MG tablet  Take 1 tablet (100 mg total) by mouth once for 1 dose. May repeat in 2 hours if headache persists or recurs. 10 tablet 2   No current facility-administered medications on file prior to visit.    No Known Allergies  Social History:  reports that she has never smoked. She has never used smokeless tobacco. She reports current alcohol use. She reports that she does not use drugs.  Family History  Problem Relation Age of Onset  . Colon cancer Maternal Aunt 57  . Alcohol abuse Maternal Grandfather        colon  . Diabetes Paternal Grandmother   . Healthy Mother        heavy menses; early hysterectomy  . High blood pressure Father     The following portions of the patient's history were reviewed and updated as appropriate: allergies, current medications, past family history, past medical history, past social history, past surgical history and problem list.  Review of Systems Pertinent items noted in HPI and remainder of comprehensive ROS otherwise negative.  Physical Exam:  BP 119/76   Pulse 86   Ht 5\' 8"  (1.727 m)   Wt 144 lb 4 oz (65.4 kg)   LMP 06/13/2019 (Approximate)   BMI 21.93 kg/m  CONSTITUTIONAL: Well-developed, well-nourished female in no acute distress.  HENT:  Normocephalic, atraumatic, External right and left ear normal. Oropharynx is clear and moist EYES: Conjunctivae  and EOM are normal. Pupils are equal, round, and reactive to light. No scleral icterus.  NECK: Normal range of motion, supple, no masses.  Normal thyroid.  SKIN: Skin is warm and dry. No rash noted. Not diaphoretic. No erythema. No pallor. MUSCULOSKELETAL: Normal range of motion. No tenderness.  No cyanosis, clubbing, or edema.  2+ distal pulses. NEUROLOGIC: Alert and oriented to person, place, and time. Normal reflexes, muscle tone coordination.  PSYCHIATRIC: Normal mood and affect. Normal behavior. Normal judgment and thought content. CARDIOVASCULAR: Normal heart rate noted, regular rhythm RESPIRATORY:  Clear to auscultation bilaterally. Effort and breath sounds normal, no problems with respiration noted. BREASTS: Symmetric in size. No masses, tenderness, skin changes, nipple drainage, or lymphadenopathy bilaterally. ABDOMEN: Soft, no distention noted.  No tenderness, rebound or guarding.  PELVIC: Normal appearing external genitalia and urethral meatus; normal appearing vaginal mucosa and cervix.  No abnormal discharge noted.  Pap smear obtained, contact bleeding. Normal uterine size, no other palpable masses, no uterine or adnexal tenderness.   Assessment and Plan:    Annual [facility] GYN Exam. Will follow up results of pap smear and manage accordingly. Mammogram not indicated Labs: completed by PCP 03/2019 Refill: BC Routine preventative health maintenance measures emphasized. Please refer to After Visit Summary for other counseling recommendations.   Philip Aspen, CNM      Philip Aspen, CNM

## 2019-06-28 LAB — CYTOLOGY - PAP
Comment: NEGATIVE
Comment: NEGATIVE
Comment: NEGATIVE
Diagnosis: NEGATIVE
HPV 16: NEGATIVE
HPV 18 / 45: POSITIVE — AB
High risk HPV: POSITIVE — AB

## 2019-07-04 MED FILL — VENLAFAXINE HCL ER 75 MG CA: 75 | 90 days supply | Qty: 90 | Fill #1

## 2019-07-11 MED FILL — FEMYNOR 0.25-35 MG-MCG TABS: 0.25-35 | 84 days supply | Qty: 84 | Fill #3

## 2019-07-13 ENCOUNTER — Encounter: Payer: No Typology Code available for payment source | Admitting: Obstetrics and Gynecology

## 2019-07-21 ENCOUNTER — Other Ambulatory Visit: Payer: Self-pay

## 2019-07-21 ENCOUNTER — Ambulatory Visit (INDEPENDENT_AMBULATORY_CARE_PROVIDER_SITE_OTHER): Payer: No Typology Code available for payment source | Admitting: Obstetrics and Gynecology

## 2019-07-21 ENCOUNTER — Encounter: Payer: Self-pay | Admitting: Obstetrics and Gynecology

## 2019-07-21 VITALS — BP 130/82 | HR 119 | Ht 68.0 in | Wt 146.0 lb

## 2019-07-21 DIAGNOSIS — B977 Papillomavirus as the cause of diseases classified elsewhere: Secondary | ICD-10-CM

## 2019-07-21 NOTE — Progress Notes (Signed)
GYNECOLOGY PROGRESS NOTE  Subjective:    Patient ID: Janet Thompson, female    DOB: 06-28-1991, 29 y.o.   MRN: [id number]  HPI  Patient is a 29 y.o. [id number] female who presents for colposcopy. Was referred from Philip Aspen, CNM.  Last pap smear was NILM, HR HPV+ (18/45 typing).  Patient denies a history of prior abnormal pap smears.   The following portions of the patient's history were reviewed and updated as appropriate:  She  has a past medical history of Frequent headaches, History of fainting spells of unknown cause, Menstrual irregularity, Migraines, Placenta previa antepartum in second trimester (05/07/2018), and Positive QuantiFERON-TB Gold test.   She  has a past surgical history that includes Wisdom tooth extraction. Her family history includes Alcohol abuse in her maternal grandfather; Colon cancer (age of onset: 58) in her maternal aunt; Diabetes in her paternal grandmother; Healthy in her mother; High blood pressure in her father.   She  reports that she has never smoked. She has never used smokeless tobacco. She reports previous alcohol use. She reports that she does not use drugs.    Current Outpatient Medications on File Prior to Visit  Medication Sig Dispense Refill  . b complex vitamins capsule Take 1 capsule by mouth daily.    Marland Kitchen loratadine (CLARITIN) 10 MG tablet Take 10 mg by mouth daily as needed.     . magnesium oxide (MAG-OX) 400 (241.3 Mg) MG tablet Take 1 tablet (400 mg total) by mouth daily. 60 tablet 1  . Multiple Vitamin (MULTIVITAMIN) capsule Take 1 capsule by mouth daily.    . norgestimate-ethinyl estradiol (SPRINTEC 28) 0.25-35 MG-MCG tablet Take 1 tablet by mouth daily. 1 Package 11  . SUMAtriptan (IMITREX) 100 MG tablet Take 1 tablet (100 mg total) by mouth once for 1 dose. May repeat in 2 hours if headache persists or recurs. 10 tablet 2  . venlafaxine XR (EFFEXOR-XR) 75 MG 24 hr capsule Take 1 capsule (75 mg total) by mouth daily. 90 capsule 1    No current facility-administered medications on file prior to visit.   She has No Known Allergies..  Review of Systems Pertinent items noted in HPI and remainder of comprehensive ROS otherwise negative.   Objective:   Blood pressure 130/82, pulse (!) 119, height 5\' 8"  (1.727 m), weight 146 lb (66.2 kg), last menstrual period 07/12/2019, not currently breastfeeding. General appearance: alert and no distress Remainder of exam deferred.     Cytology:  Results for orders placed or performed in visit on 06/20/19  Cytology - PAP  Result Value Ref Range   High risk HPV Positive (A)    HPV 16 Negative    HPV 18 / 45 Positive (A)    Adequacy      Satisfactory for evaluation; transformation zone component PRESENT.   Diagnosis      - Negative for intraepithelial lesion or malignancy (NILM)   Comment Normal Reference Range HPV - Negative    Comment Normal Reference Range HPV 16 18 45 -Negative    Comment Normal Reference Range HPV 16- Negative      Assessment:   HPV+ pap smear  Plan:   Based on new ASCCP Guidelines, patient with HPV pap smear and no prior abnormalities can be monitored with surveillance with repeat pap smear in 1 year.  Discussed guidelines and recommendations with patient. Also discussed role of HPV as pertains to abnormal pap smears and cervical cancer.  Answered questions regarding her HPV  status in light of the fact that she also had the HPV vaccine many years ago. All questions answered. Patient notes understanding. Will f/u with repeat pap smear in 1 year. If still positive, will warrant colposcopy at that time.   A total of 15 minutes were spent face-to-face with the patient during this encounter and over half of that time dealt with counseling and coordination of care.  Rubie Maid, MD [facility]

## 2019-07-21 NOTE — Progress Notes (Signed)
Pt is present for colpo. Pt stated that she was doing well would like to go over test results of her last pap smear with provider.

## 2019-07-21 NOTE — Patient Instructions (Signed)
Human Papillomavirus Human papillomavirus (HPV) is the most common sexually transmitted infection (STI). It spreads easily from person to person (ishighly contagious). There are many types of HPV. It usually does not cause symptoms. However, sometimes it may cause wart-like lesions in the throat or warts in the genital area. It is possible to be infected for a long time and pass HPV to others without knowing it. Certain types of HPV may cause cancers, including cancer of the lower part of the uterus (cervix), vagina, outer female genital area (vulva), penis, anus, and rectum. HPV may also cause cancers of the oral cavity, such as the throat, tongue, and tonsils. What are the causes? HPV is caused by a virus that spreads from person to person through oral, vaginal, or anal sex. What increases the risk? You may be more likely to develop this condition if you have or have had:  Unprotected oral, vaginal, or anal sex.  Several sex partners.  A sex partner who has other sex partners.  Another STI.  A weak disease-fighting system (immune system).  Damaged skin in the genital, oral, or anal area. What are the signs or symptoms? Most people who have HPV do not have any symptoms. If symptoms are present, they may include:  Wart-like lesions in the throat (from having oral sex).  Warts on the infected skin or mucous membranes.  Genital warts that may itch, burn, bleed, or be painful during sex. How is this diagnosed? If you have wart-like lumps in the anal area or throat, or if genital warts are present, your health care provider can usually diagnose HPV with a physical exam. Genital warts are easily seen. In females, tests may be used to diagnose HPV, including:  A Pap test. A Pap test takes a sample of cells from the cervix to check for cancer and HPV infection.  An HPV test. This is similar to a Pap test and involves taking a sample of cells from the cervix.  Using a scope to view the  cervix (colposcopy). This may be done if a pelvic exam or Pap test is abnormal. A sample of tissue may be removed for testing (biopsy) during the colposcopy. Currently, there is no test to detect HPV in males. How is this treated? There is no treatment for the virus itself. However, there are treatments for the health problems and symptoms HPV can cause. Treatment for HPV may include:  Medicines in a cream, lotion, liquid, or gel form. These medicines may be injected into or applied to genital or anal warts.  Use of a probe to apply extreme cold (cryotherapy) to the genital or anal warts.  Application of an intense beam of light (laser treatment) on the genital or anal warts.  Use of a probe to apply extreme heat (electrocautery) on the genital or anal warts.  Surgery to remove the genital or anal warts. Your health care provider will monitor you closely after you are treated. HPV can come back and you may need treatment again. Follow these instructions at home: Medicines  Take over-the-counter and prescription medicines only as told by your health care provider. This include creams for itching or irritation.  Do not treat genital or anal warts with medicines used for treating hand warts. General instructions  Do not touch or scratch the warts.  Do not have sex while you are being treated.  Do not douche or use tampons during treatment (for women).  Tell your sex partner about your infection. He or she  may also need to be treated.  If you become pregnant, tell your health care provider that you have HPV. Your health care provider will monitor you closely during pregnancy to make sure you and your baby are safe.  Keep all follow-up visits as told by your health care provider. This is important. How is this prevented?  Talk with your health care provider about getting an HPV vaccine, which can prevent some HPV infections and related cancers. It will not work if you already have HPV  and is not recommended for pregnant women. You may need 2-3 doses of the vaccine, depending on your age.  After treatment, use condoms during sex to prevent future infections.  Have only one sex partner.  Have a sex partner who does not have other sex partners.  Get regular Pap tests as directed by your health care provider. Contact a health care provider if:  The treated skin becomes red, swollen, or painful.  You have a fever.  You feel generally ill.  You feel lumps or pimples in and around your genital or anal area.  You develop bleeding of the vagina or the treatment area.  You have painful sex. Summary  Human papillomavirus (HPV) is the most common sexually transmitted infection (STI) and is highly contagious.  Most people carrying HPV do not have any symptoms.  Many forms of HPV can be prevented with vaccination.  There is no treatment for the virus itself. However, there are treatments for the health problems and symptoms HPV can cause. This information is not intended to replace advice given to you by your health care provider. Make sure you discuss any questions you have with your health care provider. Document Revised: 02/22/2019 Document Reviewed: 02/11/2018 Elsevier Patient Education  [place].

## 2019-07-22 ENCOUNTER — Encounter: Payer: No Typology Code available for payment source | Admitting: Certified Nurse Midwife

## 2019-07-26 ENCOUNTER — Encounter: Payer: Self-pay | Admitting: Family Medicine

## 2019-08-09 NOTE — Telephone Encounter (Signed)
Error

## 2019-09-22 ENCOUNTER — Other Ambulatory Visit: Payer: Self-pay | Admitting: Family Medicine

## 2019-09-22 DIAGNOSIS — G43809 Other migraine, not intractable, without status migrainosus: Secondary | ICD-10-CM

## 2019-09-23 MED FILL — VENLAFAXINE HCL ER 75 MG CA: 75 | 90 days supply | Qty: 90 | Fill #0

## 2019-10-06 MED FILL — FEMYNOR 0.25-35 MG-MCG TABS: 0.25-35 | 56 days supply | Qty: 56 | Fill #4

## 2019-10-07 ENCOUNTER — Other Ambulatory Visit: Payer: Self-pay | Admitting: Family Medicine

## 2019-10-07 DIAGNOSIS — G43809 Other migraine, not intractable, without status migrainosus: Secondary | ICD-10-CM

## 2019-11-10 ENCOUNTER — Other Ambulatory Visit: Payer: Self-pay

## 2019-11-10 MED ORDER — NORGESTIMATE-ETH ESTRADIOL 0.25-35 MG-MCG PO TABS: 1.0000 | ORAL_TABLET | Freq: Every day | ORAL | 5 refills | Status: DC

## 2019-11-21 MED FILL — FEMYNOR 0.25-35 MG-MCG TABS: 0.25-35 | 84 days supply | Qty: 84 | Fill #0

## 2019-12-22 MED FILL — VENLAFAXINE HCL ER 75 MG CA: 75 | 90 days supply | Qty: 90 | Fill #1

## 2020-01-10 ENCOUNTER — Encounter: Payer: Self-pay | Admitting: Family Medicine

## 2020-01-10 DIAGNOSIS — Z111 Encounter for screening for respiratory tuberculosis: Secondary | ICD-10-CM

## 2020-01-11 NOTE — Telephone Encounter (Signed)
quantiferon test ordered; please help set up lab appointment for her. Thanks!

## 2020-01-11 NOTE — Telephone Encounter (Signed)
Pt stated she just needs a quantiferon test and no longer needs any paper work filled out. Sending message back for test   Pt can be reached at [phone]- ok to leave a detailed message

## 2020-01-12 ENCOUNTER — Other Ambulatory Visit: Payer: Self-pay

## 2020-01-12 ENCOUNTER — Other Ambulatory Visit: Payer: No Typology Code available for payment source

## 2020-01-12 DIAGNOSIS — Z111 Encounter for screening for respiratory tuberculosis: Secondary | ICD-10-CM

## 2020-01-15 LAB — QUANTIFERON-TB GOLD PLUS
Mitogen-NIL: >10 IU/mL
NIL: 0.06 IU/mL
QuantiFERON-TB Gold Plus: NEGATIVE
TB1-NIL: 0.03 IU/mL
TB2-NIL: 0.07 IU/mL

## 2020-01-26 ENCOUNTER — Emergency Department (HOSPITAL_COMMUNITY)
Admission: EM | Admit: 2020-01-26 | Discharge: 2020-01-26 | Disposition: A | Discharge status: Home / Self Care | Payer: No Typology Code available for payment source | Attending: Emergency Medicine | Admitting: Emergency Medicine

## 2020-01-26 ENCOUNTER — Other Ambulatory Visit: Payer: Self-pay | Admitting: Physician Assistant

## 2020-01-26 ENCOUNTER — Other Ambulatory Visit: Payer: Self-pay

## 2020-01-26 DIAGNOSIS — R011 Cardiac murmur, unspecified: Secondary | ICD-10-CM

## 2020-01-26 DIAGNOSIS — R002 Palpitations: Secondary | ICD-10-CM | POA: Insufficient documentation

## 2020-01-26 DIAGNOSIS — R55 Syncope and collapse: Secondary | ICD-10-CM | POA: Diagnosis not present

## 2020-01-26 DIAGNOSIS — R0602 Shortness of breath: Secondary | ICD-10-CM | POA: Diagnosis not present

## 2020-01-26 DIAGNOSIS — I341 Nonrheumatic mitral (valve) prolapse: Secondary | ICD-10-CM | POA: Diagnosis not present

## 2020-01-26 DIAGNOSIS — R000 Tachycardia, unspecified: Secondary | ICD-10-CM | POA: Diagnosis present

## 2020-01-26 DIAGNOSIS — I471 Supraventricular tachycardia: Secondary | ICD-10-CM | POA: Diagnosis not present

## 2020-01-26 LAB — CBC
HCT: 41.2 % (ref 36.0–46.0)
Hemoglobin: 13.5 g/dL (ref 12.0–15.0)
MCH: 29.3 pg (ref 26.0–34.0)
MCHC: 32.8 g/dL (ref 30.0–36.0)
MCV: 89.4 fL (ref 80.0–100.0)
Platelets: 259 10*3/uL (ref 150–400)
RBC: 4.61 MIL/uL (ref 3.87–5.11)
RDW: 12.1 % (ref 11.5–15.5)
WBC: 7.4 10*3/uL (ref 4.0–10.5)
nRBC: 0 % (ref 0.0–0.2)

## 2020-01-26 LAB — PHOSPHORUS: Phosphorus: 3.5 mg/dL (ref 2.5–4.6)

## 2020-01-26 LAB — BASIC METABOLIC PANEL
Anion gap: 13 (ref 5–15)
BUN: 11 mg/dL (ref 6–20)
CO2: 24 mmol/L (ref 22–32)
Calcium: 9.4 mg/dL (ref 8.9–10.3)
Chloride: 102 mmol/L (ref 98–111)
Creatinine, Ser: 0.65 mg/dL (ref 0.44–1.00)
GFR calc Af Amer: >60 mL/min (ref >60)
GFR calc non Af Amer: >60 mL/min (ref >60)
Glucose, Bld: 104 mg/dL — ABNORMAL HIGH (ref 70–99)
Potassium: 3.2 mmol/L — ABNORMAL LOW (ref 3.5–5.1)
Sodium: 139 mmol/L (ref 135–145)

## 2020-01-26 LAB — MAGNESIUM: Magnesium: 1.7 mg/dL (ref 1.7–2.4)

## 2020-01-26 MED ORDER — POTASSIUM CHLORIDE CRYS ER 20 MEQ PO TBCR
30.0000 meq | EXTENDED_RELEASE_TABLET | Freq: Once | ORAL | Status: AC
  Administered 2020-01-26: 30 meq via ORAL
  Filled 2020-01-26: qty 2

## 2020-01-26 MED ORDER — METOPROLOL SUCCINATE ER 25 MG PO TB24
25.0000 mg | ORAL_TABLET | Freq: Every day | ORAL | 0 refills | Status: DC
Start: 2020-01-26 — End: 2020-02-08

## 2020-01-26 MED ORDER — SODIUM CHLORIDE 0.9 % IV BOLUS
1000.0000 mL | Freq: Once | INTRAVENOUS | Status: AC
  Administered 2020-01-26: 1000 mL via INTRAVENOUS

## 2020-01-26 MED ORDER — SODIUM CHLORIDE 0.9% FLUSH: 3.0000 mL | Freq: Once | INTRAVENOUS | Status: DC

## 2020-01-26 NOTE — ED Provider Notes (Signed)
[facility] EMERGENCY DEPARTMENT Provider Note   CSN: [id number] Arrival date & time: 01/26/20  1523     History Chief Complaint  Patient presents with  . Tachycardia    Janet Thompson is an otherwise healthy 29 y.o. female who presents with palpitations, shortness of breath that began about 2:15 PM today.  Patient states she began feeling more fatigued than normal at approximately 10 AM today.  Patient denies chest pain associated with sensation of palpitations.  Denies fever, URI symptoms, abdominal pain, nausea/vomiting.  Patient does endorse a prior episode of similar symptoms approximately 2 weeks ago that lasted approximately 45 minutes before self resolving.  Patient denies alcohol, drug use.  Home medications include Effexor, birth control. Patient denies increased stress at home, work. Endorses normal PO intake.    HPI     Past Medical History:  Diagnosis Date  . Frequent headaches   . History of fainting spells of unknown cause   . Menstrual irregularity   . Migraines   . Placenta previa antepartum in second trimester 05/07/2018  . Positive QuantiFERON-TB Gold test     Patient Active Problem List   Diagnosis Date Noted  . Migraines 08/17/2017    Past Surgical History:  Procedure Laterality Date  . WISDOM TOOTH EXTRACTION       OB History    Gravida  2   Para  2   Term  2   Preterm  0   AB  0   Living  2     SAB  0   TAB  0   Ectopic  0   Multiple  0   Live Births  2           Family History  Problem Relation Age of Onset  . Colon cancer Maternal Aunt 66  . Alcohol abuse Maternal Grandfather        colon  . Diabetes Paternal Grandmother   . Healthy Mother        heavy menses; early hysterectomy  . High blood pressure Father     Social History   Tobacco Use  . Smoking status: Never Smoker  . Smokeless tobacco: Never Used  Vaping Use  . Vaping Use: Never used  Substance Use Topics  . Alcohol use: Not  Currently    Comment: occasional  . Drug use: No    Home Medications Prior to Admission medications   Medication Sig Start Date End Date Taking? Authorizing Provider  norgestimate-ethinyl estradiol (SPRINTEC 28) 0.25-35 MG-MCG tablet Take 1 tablet by mouth daily. 11/10/19  Yes Philip Aspen, CNM  SUMAtriptan (IMITREX) 100 MG tablet TAKE 1 TABLET BY MOUTH ONCE FOR 1 DOSE, MAY REPEAT IN 2 HOURS IF HEADACHE PERSISTS/RECURS Patient taking differently: Take 100 mg by mouth every 2 (two) hours as needed for migraine.  10/07/19  Yes Koberlein, Junell C, MD  venlafaxine XR (EFFEXOR-XR) 75 MG 24 hr capsule TAKE 1 CAPSULE (75 MG TOTAL) BY MOUTH DAILY. 09/23/19  Yes Koberlein, Junell C, MD  magnesium oxide (MAG-OX) 400 (241.3 Mg) MG tablet Take 1 tablet (400 mg total) by mouth daily. Patient not taking: Reported on 01/26/2020 08/24/18   Diona Fanti, CNM    Allergies    Patient has no known allergies.  Review of Systems   Review of Systems  Constitutional: Negative for chills and fever.  HENT: Negative for congestion, ear pain and sore throat.   Respiratory: Positive for shortness of breath. Negative for cough.  Cardiovascular: Positive for palpitations. Negative for chest pain.  Gastrointestinal: Negative for abdominal pain, diarrhea and vomiting.  Genitourinary: Negative for dysuria and hematuria.  Musculoskeletal: Negative for arthralgias and back pain.  Skin: Negative for color change and rash.  Neurological: Negative for syncope and headaches.  Psychiatric/Behavioral: The patient is not nervous/anxious.   All other systems reviewed and are negative.   Physical Exam Updated Vital Signs BP (!) 152/123   Pulse (!) 160   Temp 99 F (37.2 C) (Oral)   Resp 16   Ht 5\' 8"  (1.727 m)   Wt 65.8 kg   SpO2 100%   BMI 22.05 kg/m   Physical Exam Vitals and nursing note reviewed.  Constitutional:      General: She is not in acute distress.    Appearance: Normal appearance. She  is normal weight. She is not ill-appearing, toxic-appearing or diaphoretic.  HENT:     Head: Normocephalic and atraumatic.     Nose: Nose normal.     Mouth/Throat:     Mouth: Mucous membranes are moist.  Eyes:     Extraocular Movements: Extraocular movements intact.     Conjunctiva/sclera: Conjunctivae normal.  Cardiovascular:     Rate and Rhythm: Regular rhythm. Tachycardia present.     Heart sounds: Normal heart sounds. No murmur heard.   Pulmonary:     Effort: Pulmonary effort is normal. No respiratory distress.     Breath sounds: Normal breath sounds. No wheezing or rhonchi.  Abdominal:     General: Abdomen is flat.     Palpations: Abdomen is soft.     Tenderness: There is no abdominal tenderness. There is no guarding or rebound.  Musculoskeletal:     Right lower leg: No edema.     Left lower leg: No edema.  Skin:    General: Skin is warm.     Findings: No rash.  Neurological:     General: No focal deficit present.     Mental Status: She is alert and oriented to person, place, and time.  Psychiatric:        Mood and Affect: Mood normal.        Behavior: Behavior normal.        Thought Content: Thought content normal.     ED Results / Procedures / Treatments   Labs (all labs ordered are listed, but only abnormal results are displayed) Labs Reviewed  BASIC METABOLIC PANEL - Abnormal; Notable for the following components:      Result Value   Potassium 3.2 (*)    Glucose, Bld 104 (*)    All other components within normal limits  CBC  MAGNESIUM  PHOSPHORUS    EKG EKG Interpretation  Date/Time:  Thursday January 26 2020 15:48:39 EDT Ventricular Rate:  108 PR Interval:    QRS Duration: 83 QT Interval:  324 QTC Calculation: 435 R Axis:   83 Text Interpretation: Sinus tachycardia RSR' in V1 or V2, probably normal variant Repol abnrm suggests ischemia, diffuse leads Confirmed by Quintella Reichert [phone]) on 01/26/2020 4:00:31 PM   Radiology No results  found.  Procedures Procedures (including critical care time)  Medications Ordered in ED Medications  sodium chloride flush (NS) 0.9 % injection 3 mL (has no administration in time range)  potassium chloride SA (KLOR-CON) CR tablet 30 mEq (has no administration in time range)  sodium chloride 0.9 % bolus 1,000 mL (1,000 mLs Intravenous New Bag/Given 01/26/20 1600)    ED Course  I have reviewed the  triage vital signs and the nursing notes.  Pertinent labs & imaging results that were available during my care of the patient were reviewed by me and considered in my medical decision making (see chart for details).    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  In triage patient found to be tachycardic with HR 150-170s.  Initial EKG taken in triage concerning for sinus tachycardia versus a flutter with RVR.  Patient was quickly assessed by myself as well as supervising physician.  Patient denies significant personal or family cardiac history.  Denies chest pain at this time.  Patient denies fever, URI symptoms.  On exam patient tachycardic to HR 150s, HDS, afebrile.  Patient overall well-appearing.  On cardiac auscultation tachycardic rate without appreciable murmur.  Lungs clear in all fields.  No lower extremity edema.  Just following physical exam patient with improvement in tachycardia to HR 100s.  Repeat EKG significant for sinus tachycardia at this time.  Will check CBC, BMP, mag, phos.  BMP significant for K 3.2, will replete w PO KCl. Due to previous similar episode approximately 2 weeks prior we will consult cardiology in the setting of recurrent tachycardia concerning for a flutter.  Cardiology reviewed EKGs, telemetry and evaluated patient at bedside.  Evaluation of telemetry showed beat by beat change in rate/rhythm.  Likely true arrhythmia, AVNRT.   Due to prior episode of palpitations approximately 2 weeks prior will initiate patient on 25 mg metoprolol daily.  Patient will follow up with cardiology as an outpatient. Based on the above findings, I believe patient is hemodynamically stable for discharge. Patient educated about specific return precautions for given chief complaint and symptoms. Patient educated about follow-up with PCP and Cardiology. Patient expressed understanding of return precautions and need for follow-up. Patient discharged.    Final Clinical Impression(s) / ED Diagnoses Final diagnoses:  Tachycardia  Palpitations  AVNRT (AV nodal re-entry tachycardia) [facility])    Rx / DC Orders ED Discharge Orders    None       Kennyth Lose, MD 01/27/20 0106    Quintella Reichert, MD 01/27/20 1227    Quintella Reichert, MD 01/27/20 1230

## 2020-01-26 NOTE — Consult Note (Signed)
Cardiology Consultation:   Patient ID: Janet Thompson MRN: [id number]; DOB: 1990-10-14  Admit date: 01/26/2020 Date of Consult: 01/26/2020  Primary Care Provider: Caren Macadam, MD [facility] Cardiologist: No primary care provider on file. New [facility] Electrophysiologist:  None    Patient Profile:   Janet Thompson is a 29 y.o. female with a hx of general good health who is being seen today for the evaluation of tachycardoa at the request of Dr. 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  Her coworkers placed her on the monitor and her heart rate was approximately 170 bpm.  She subsequently came to the emergency room.  She did not have syncope/presyncope, dyspnea or angina, but had prominent unpleasant palpitations.  She felt very tired.  While on telemetry in the emergency room she had abrupt cessation of tachycardia with what appeared to be a PAC with a distinct P wave.  The emerging rhythm was normal sinus rhythm at about 70 bpm.  The ECG during tachycardia shows a retrograde P wave on the terminal segment of the S wave in multiple leads, especially office in the lateral precordial leads.  There is no evidence of preexcitation (normal PR normal QRS morphology) on the ECG in sinus rhythm ordering tachycardia.   She has had multiple similar events over the years going back to when she was a young teenager.  They usually terminate spontaneously after just a few minutes.  The most recent previous event happened about 4 weeks ago.  Independent the palpitations, she has had numerous syncopal events, dating back to childhood.  These are often brought on by standing up too quickly.  She recovers immediately after lying down.  Her husband has witnessed a couple of these events and confirms that they are extremely brief.  They are not associated with or preceded by the  palpitations.  Between the ages of 71 and 25 she had very frequent events.  She reports at that time she was extremely lean and was very athletic.  After 2 subsequent pregnancies and gaining some weight, the episodes of syncope have reduced in frequency, although they have not completely resolved: She still has occasional episodes of presyncope and has to lie down to make them resolve.  She has no known structural heart disease.  She was told during one of her pregnancies that she had a heart murmur.  She has occasional migraine headaches which she treats with sumatriptan.  This has not triggered either tachycardia or syncope.   Past Medical History:  Diagnosis Date  . Frequent headaches   . History of fainting spells of unknown cause   . Menstrual irregularity   . Migraines   . Placenta previa antepartum in second trimester 05/07/2018  . 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 WISDOM TOOTH EXTRACTION       Home Medications:  Prior to Admission medications   Medication Sig Start Date End Date Taking? Authorizing Provider  norgestimate-ethinyl estradiol (SPRINTEC 28) 0.25-35 MG-MCG tablet Take 1 tablet by mouth daily. 11/10/19  Yes Philip Aspen, CNM  SUMAtriptan (IMITREX) 100 MG tablet TAKE 1 TABLET BY MOUTH ONCE FOR 1 DOSE, MAY REPEAT IN 2 HOURS IF HEADACHE PERSISTS/RECURS Patient taking differently: Take 100 mg by mouth every 2 (two) hours as needed for migraine.  10/07/19  Yes Koberlein, Junell C,  MD  venlafaxine XR (EFFEXOR-XR) 75 MG 24 hr capsule TAKE 1 CAPSULE (75 MG TOTAL) BY MOUTH DAILY. 09/23/19  Yes Koberlein, Junell C, MD  magnesium oxide (MAG-OX) 400 (241.3 Mg) MG tablet Take 1 tablet (400 mg total) by mouth daily. Patient not taking: Reported on 01/26/2020 08/24/18   Diona Fanti, CNM  metoprolol succinate (TOPROL-XL) 25 MG 24 hr tablet Take 1 tablet (25 mg total) by mouth daily. 01/26/20 02/25/20  Kennyth Lose, MD     Inpatient Medications: Scheduled Meds: . sodium chloride flush  3 mL Intravenous Once   Continuous Infusions:  PRN Meds:   Allergies:   No Known Allergies  Social History:   Social History   Socioeconomic History  . Marital status: Married    Spouse name: Not on file  . Number of children: Not on file  . Years of education: Not on file  . Highest education level: Not on file  Occupational History  . Not on file  Tobacco Use  . Smoking status: Never Smoker  . Smokeless tobacco: Never Used  Vaping Use  . Vaping Use: Never used  Substance and Sexual Activity  . Alcohol use: Not Currently    Comment: occasional  . Drug use: No  . Sexual activity: Yes    Birth control/protection: None  Other Topics Concern  . Not on file  Social History Narrative   Work or School: [employer] Situation: Has 10-year-old son and 2019      Spiritual Beliefs:      Lifestyle: Healthy diet, regular activity   Social Determinants of Health   Financial Resource Strain:   . Difficulty of Paying Living Expenses:   Food Insecurity:   . Worried About Charity fundraiser in the Last Year:   . Arboriculturist in the Last Year:   Transportation Needs:   . Film/video editor (Medical):   Janet Thompson Lack of Transportation (Non-Medical):   Physical Activity:   . Days of Exercise per Week:   . Minutes of Exercise per Session:   Stress:   . Feeling of Stress :   Social Connections:   . Frequency of Communication with Friends and Family:   . Frequency of Social Gatherings with Friends and Family:   . Attends Religious Services:   . Active Member of Clubs or Organizations:   . Attends Archivist Meetings:   Janet Thompson Marital Status:   Intimate Partner Violence:   . Fear of Current or Ex-Partner:   . Emotionally Abused:   Janet Thompson Physically Abused:   . Sexually Abused:     Family History:    Family History  Problem Relation Age of Onset  . Colon cancer Maternal  Aunt 63  . Alcohol abuse Maternal Grandfather        colon  . Diabetes Paternal Grandmother   . Healthy Mother        heavy menses; early hysterectomy  . High blood pressure Father      ROS:  Please see the history of present illness.   All other ROS reviewed and negative.     Physical Exam/Data:   Vitals:   01/26/20 1531  BP: (!) 152/123  Pulse: (!) 160  Resp: 16  Temp: 99 F (37.2 C)  TempSrc: Oral  SpO2: 100%  Weight: 65.8 kg  Height: 5\' 8"  (1.727 m)   No intake or output data in the 24 hours ending 01/26/20 1719  Last 3 Weights 01/26/2020 07/21/2019 06/20/2019  Weight (lbs) 145 lb 146 lb 144 lb 4 oz  Weight (kg) 65.772 kg 66.225 kg 65.431 kg     Body mass index is 22.05 kg/m.  General:  Well nourished, well developed, in no acute distress HEENT: normal Lymph: no adenopathy Neck: no JVD Endocrine:  No thryomegaly Vascular: No carotid bruits; FA pulses 2+ bilaterally without bruits  Cardiac:  normal S1, S2; RRR; no murmur.  There is a distinct midsystolic click that accentuates with a Valsalva maneuver and completely disappears with bilateral handgrip, but there is no murmur either at rest or with provocative maneuvers Lungs:  clear to auscultation bilaterally, no wheezing, rhonchi or rales  Abd: soft, nontender, no hepatomegaly  Ext: no edema Musculoskeletal:  No deformities, BUE and BLE strength normal and equal Skin: warm and dry  Neuro:  CNs 2-12 intact, no focal abnormalities noted Psych:  Normal affect   EKG:  The EKG was personally reviewed and demonstrates:   -ECG during tachycardia shows narrow QRS regular rapid rhythm with retrograde P wave in the terminal portion of the QRS -ECG during sinus rhythm shows normal PR conduction time, RSR prime pattern in leads V1-V2, otherwise a completely normal tracing Telemetry:  Telemetry was personally reviewed and demonstrates: Abrupt cessation of tachycardia with conversion to sinus rhythm.  The very last beat of  tachycardia has a very different P wave preceding the QRS complex.  Relevant CV Studies: Not applicable  Laboratory Data:  High Sensitivity Troponin:  No results for input(s): TROPONINIHS in the last 720 hours.   Chemistry Recent Labs  Lab 01/26/20 1601  NA 139  K 3.2*  CL 102  CO2 24  GLUCOSE 104*  BUN 11  CREATININE 0.65  CALCIUM 9.4  GFRNONAA >60  GFRAA >60  ANIONGAP 13    No results for input(s): PROT, ALBUMIN, AST, ALT, ALKPHOS, BILITOT in the last 168 hours. Hematology Recent Labs  Lab 01/26/20 1601  WBC 7.4  RBC 4.61  HGB 13.5  HCT 41.2  MCV 89.4  MCH 29.3  MCHC 32.8  RDW 12.1  PLT 259   BNPNo results for input(s): BNP, PROBNP in the last 168 hours.  DDimer No results for input(s): DDIMER in the last 168 hours.   Radiology/Studies:  No results found.   Assessment and Plan:   1. SVT: Clinical and ECG pattern are both highly consistent with AV node reentry, although accessory pathway and AV reentry tachycardia cannot be entirely excluded.  There is no evidence of preexcitation.  There is no evidence of antegrade conduction through an accessory pathway.  We will prescribe metoprolol succinate 25 mg once daily and arrange follow-up with one of our electrophysiology partners to discuss possible ablation. 2. MVP: Physical exam strongly suggestive of mitral valve prolapse, although there is no audible murmur either at rest or with provocative maneuvers.  Check echocardiogram. 3. Vasovagal syncope/presyncope: These events began in adolescence and occurred more frequently between the ages of 63 and 44 when she was undergoing very intense athletic training.  They have now improved, although they have not resolved altogether.  Beta-blocker therapy might have some beneficial effect on these as well.  Encourage staying very well-hydrated and having a relatively liberal dietary salt intake.  [facility] will sign off.   Medication Recommendations: Metoprolol  succinate 25 mg once daily (okay to start with half of the pill once daily). Other recommendations (labs, testing, etc): We will arrange outpatient echocardiogram Follow up as  an outpatient: We will make follow-up visit with Dr. Cristopher Peru in roughly 1 month, EP clinic.  For questions or updates, please contact [place] Please consult [email or web address] for contact info under    Signed, Sanda Klein, MD  01/26/2020 5:19 PM

## 2020-01-26 NOTE — ED Triage Notes (Signed)
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 Around 1415 felt her heart fluttering, her coworkers put her on the monitor, tachycardic to 170. Rested, and is still feeling tired and heart rate is 153. Endorses a similar episode to this two weeks ago.

## 2020-01-27 ENCOUNTER — Telehealth: Payer: Self-pay | Admitting: Physician Assistant

## 2020-01-27 NOTE — Telephone Encounter (Signed)
LVM for patient to return call to get the Echo scheduled from staff messages

## 2020-02-03 ENCOUNTER — Encounter: Payer: Self-pay | Admitting: Family Medicine

## 2020-02-03 DIAGNOSIS — I471 Supraventricular tachycardia: Secondary | ICD-10-CM

## 2020-02-08 ENCOUNTER — Encounter: Payer: Self-pay | Admitting: Family Medicine

## 2020-02-08 ENCOUNTER — Other Ambulatory Visit: Payer: Self-pay | Admitting: Family Medicine

## 2020-02-08 ENCOUNTER — Other Ambulatory Visit: Payer: Self-pay

## 2020-02-08 ENCOUNTER — Ambulatory Visit (INDEPENDENT_AMBULATORY_CARE_PROVIDER_SITE_OTHER): Payer: No Typology Code available for payment source | Admitting: Family Medicine

## 2020-02-08 VITALS — BP 122/90 | HR 84 | Temp 98.2°F | Ht 68.0 in | Wt 158.6 lb

## 2020-02-08 DIAGNOSIS — E538 Deficiency of other specified B group vitamins: Secondary | ICD-10-CM

## 2020-02-08 DIAGNOSIS — G43809 Other migraine, not intractable, without status migrainosus: Secondary | ICD-10-CM | POA: Diagnosis not present

## 2020-02-08 DIAGNOSIS — Z1159 Encounter for screening for other viral diseases: Secondary | ICD-10-CM

## 2020-02-08 DIAGNOSIS — R5383 Other fatigue: Secondary | ICD-10-CM

## 2020-02-08 DIAGNOSIS — E876 Hypokalemia: Secondary | ICD-10-CM | POA: Diagnosis not present

## 2020-02-08 DIAGNOSIS — R002 Palpitations: Secondary | ICD-10-CM

## 2020-02-08 LAB — HCG, QUANTITATIVE, PREGNANCY: HCG, Total, QN: <3 m[IU]/mL

## 2020-02-08 MED ORDER — VENLAFAXINE HCL ER 37.5 MG PO CP24: 37.5000 mg | ORAL_CAPSULE | Freq: Every day | ORAL | 1 refills | Status: DC

## 2020-02-08 MED FILL — VENLAFAXINE HCL ER 37.5 MG: 37.5 | 90 days supply | Qty: 90 | Fill #0

## 2020-02-08 NOTE — Patient Instructions (Signed)
Check blood pressures at home and report back to me in 2-3 weeks.

## 2020-02-08 NOTE — Progress Notes (Signed)
Janet Thompson DOB: [birth year] Encounter date: 02/08/2020  This is a 29 y.o. female who presents with Chief Complaint  Patient presents with  . Follow-up    History of present illness: Recently in the emergency room 7/29 for tachycardia.  Palpitations initially in rate of 150s.  Converted to normal sinus rhythm during emergency room stay.  Has done ok since Er visit. Maybe feeling a little tired. Sleeping well, eating ok. Episode happened on 60FU; pretty certain that a couple of weeks prior had another episode. Thinks it has happened to her before. Not taking metoprolol because worried about feeling more tired.   Occasionally gets a pain in chest and causes her to pause, but then it goes away. Only thing that has been consistent. Doesn't feel like she will pass out, no other symptoms. Lasts maybe 10 seconds. No after effects.   Hasn't checked bp at home.   Doing very well with migraine. Feels like it took a couple of months to feel "normal" but has no longer baseline sensation of having headache issue. Can have weeks at a time now where she is feeling normal. Takes imitrex just if getting bad headache, but these are triggered by something she can pinpoint.   No Known Allergies Current Meds  Medication Sig  . magnesium oxide (MAG-OX) 400 (241.3 Mg) MG tablet Take 1 tablet (400 mg total) by mouth daily.  . norgestimate-ethinyl estradiol (SPRINTEC 28) 0.25-35 MG-MCG tablet Take 1 tablet by mouth daily.  . SUMAtriptan (IMITREX) 100 MG tablet TAKE 1 TABLET BY MOUTH ONCE FOR 1 DOSE, MAY REPEAT IN 2 HOURS IF HEADACHE PERSISTS/RECURS (Patient taking differently: Take 100 mg by mouth every 2 (two) hours as needed for migraine. )  . venlafaxine XR (EFFEXOR-XR) 75 MG 24 hr capsule TAKE 1 CAPSULE (75 MG TOTAL) BY MOUTH DAILY.  . [DISCONTINUED] metoprolol succinate (TOPROL-XL) 25 MG 24 hr tablet Take 1 tablet (25 mg total) by mouth daily.    Review of Systems  Constitutional: Negative for  chills, fatigue and fever.  Respiratory: Negative for cough, chest tightness, shortness of breath and wheezing.   Cardiovascular: Negative for chest pain, palpitations and leg swelling.  Genitourinary:       First 1-2 days of period are heavy. Last few days light    Objective:  BP 122/90 (BP Location: Left Arm, Patient Position: Sitting, Cuff Size: Normal)   Pulse 84   Temp 98.2 F (36.8 C) (Oral)   Ht 5\' 8"  (1.727 m)   Wt 158 lb 9.6 oz (71.9 kg)   LMP 01/23/2020 (Exact Date)   SpO2 99%   BMI 24.12 kg/m   Weight: 158 lb 9.6 oz (71.9 kg)   BP Readings from Last 3 Encounters:  02/08/20 122/90  01/26/20 (!) 152/123  07/21/19 130/82   Wt Readings from Last 3 Encounters:  02/08/20 158 lb 9.6 oz (71.9 kg)  01/26/20 145 lb (65.8 kg)  07/21/19 146 lb (66.2 kg)    Physical Exam Constitutional:      General: She is not in acute distress.    Appearance: She is well-developed.  Cardiovascular:     Rate and Rhythm: Normal rate and regular rhythm.     Heart sounds: Normal heart sounds. No murmur heard.  No friction rub.  Pulmonary:     Effort: Pulmonary effort is normal. No respiratory distress.     Breath sounds: Normal breath sounds. No wheezing or rales.  Musculoskeletal:     Right lower leg: No edema.  Left lower leg: No edema.  Neurological:     Mental Status: She is alert and oriented to person, place, and time.  Psychiatric:        Behavior: Behavior normal.     Assessment/Plan 1. Hypokalemia Will recheck today - Basic metabolic panel; Future  2. Encounter for hepatitis C screening test for low risk patient - Hepatitis C antibody; Future  3. Other migraine without status migrainosus, not intractable Interest in decreasing dose of me for side effect (weight gain) - venlafaxine XR (EFFEXOR XR) 37.5 MG 24 hr capsule; Take 1 capsule (37.5 mg total) by mouth daily with breakfast.  Dispense: 90 capsule; Refill: 1  4. Low serum vitamin B12 Will recheck  levels  5. Fatigue, unspecified type - TSH; Future - T4, free; Future - T3, free; Future - Iron, TIBC and Ferritin Panel; Future  6. Palpitations Will recheck labs, she has follow up with cardiology and echo next week.   She will report bp back to me   Return in about 3 months (around 05/10/2020) for Chronic condition visit.    Micheline Rough, MD

## 2020-02-08 NOTE — Addendum Note (Signed)
Addended by: Marrion Coy on: 02/08/2020 10:23 AM   Modules accepted: Orders

## 2020-02-08 NOTE — Addendum Note (Signed)
Addended by: Marrion Coy on: 02/08/2020 10:35 AM   Modules accepted: Orders

## 2020-02-09 LAB — BASIC METABOLIC PANEL
BUN: 11 mg/dL (ref 7–25)
CO2: 26 mmol/L (ref 20–32)
Calcium: 9.4 mg/dL (ref 8.6–10.2)
Chloride: 104 mmol/L (ref 98–110)
Creat: 0.68 mg/dL (ref 0.50–1.10)
Glucose, Bld: 82 mg/dL (ref 65–99)
Potassium: 4.6 mmol/L (ref 3.5–5.3)
Sodium: 138 mmol/L (ref 135–146)

## 2020-02-09 LAB — T3, FREE: T3, Free: 3 pg/mL (ref 2.3–4.2)

## 2020-02-09 LAB — IRON,TIBC AND FERRITIN PANEL
%SAT: 28 % (calc) (ref 16–45)
Ferritin: 16 ng/mL (ref 16–154)
Iron: 125 ug/dL (ref 40–190)
TIBC: 443 mcg/dL (calc) (ref 250–450)

## 2020-02-09 LAB — HEPATITIS C ANTIBODY
Hepatitis C Ab: NONREACTIVE
SIGNAL TO CUT-OFF: 0.01 (ref <1.00)

## 2020-02-09 LAB — TSH: TSH: 1.41 mIU/L

## 2020-02-09 LAB — T4, FREE: Free T4: 1 ng/dL (ref 0.8–1.8)

## 2020-02-13 ENCOUNTER — Ambulatory Visit (HOSPITAL_COMMUNITY): Payer: No Typology Code available for payment source | Attending: Internal Medicine

## 2020-02-13 ENCOUNTER — Other Ambulatory Visit: Payer: Self-pay

## 2020-02-13 DIAGNOSIS — R011 Cardiac murmur, unspecified: Secondary | ICD-10-CM | POA: Insufficient documentation

## 2020-02-13 LAB — ECHOCARDIOGRAM COMPLETE
Area-P 1/2: 2.76 cm2
S' Lateral: 3.1 cm

## 2020-02-13 MED FILL — FEMYNOR 0.25-35 MG-MCG TABS: 0.25-35 | 84 days supply | Qty: 84 | Fill #1

## 2020-02-15 NOTE — Progress Notes (Signed)
Noted  

## 2020-02-19 NOTE — Progress Notes (Signed)
Virtual Visit via Telephone Note   This visit type was conducted due to national recommendations for restrictions regarding the COVID-19 Pandemic (e.g. social distancing) in an effort to limit this patient's exposure and mitigate transmission in our community.  Due to her co-morbid illnesses, this patient is at least at moderate risk for complications without adequate follow up.  This format is felt to be most appropriate for this patient at this time.  The patient did not have access to video technology/had technical difficulties with video requiring transitioning to audio format only (telephone).  All issues noted in this document were discussed and addressed.  No physical exam could be performed with this format.  Please refer to the patient's chart for her  consent to telehealth for [facility].  Evaluation Performed:  Follow-up visit  This visit type was conducted due to national recommendations for restrictions regarding the COVID-19 Pandemic (e.g. social distancing).  This format is felt to be most appropriate for this patient at this time.  All issues noted in this document were discussed and addressed.  No physical exam was performed (except for noted visual exam findings with Video Visits).  Please refer to the patient's chart (MyChart message for video visits and phone note for telephone visits) for the patient's consent to telehealth for [place]  Date:  02/20/2020   ID:  Janet Thompson, DOB 1991-06-04, MRN [id number]  Patient Location:  [place] [address]   Provider location:     [address] Office [phone] Fax [phone]   PCP:  Caren Macadam, MD  Cardiologist:  Sanda Klein, MD  Electrophysiologist:  None   Chief Complaint: Follow-up for palpitations  History of Present Illness:    Janet Thompson is a 29 y.o. female who presents via audio/video  conferencing for a telehealth visit today.  Patient verified DOB and address.  She has past medical history of palpitations, frequent headaches/migraines, fainting spells of unknown cause, menstrual irregularities, placenta previa, and positive QuantiFERON gold test.  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  She was placed on cardiac monitor by coworkers and noted to have a heart rate of 170 bpm.  She then presented to the emergency room and denied symptoms of presyncope, syncope, dyspnea, angina, but was noted to have unpleasant palpitations.  She also felt very tired.  While on telemetry in the emergency room she was noted to have an abrupt cessation of tachycardia and what appeared to be a PAC with distinct P wave.  Subsequently she converted normal sinus rhythm 70 bpm.  Her EKG during tachycardia showed a retrograde P wave on the terminal segment of the S in multiple leads.  She indicated that she had several episodes over the years dating back to when she was a teenager.  She indicated that they usually terminate spontaneously with them just a few minutes.  She stated that recently she had an episode around 4 weeks ago.  She also indicated that she has had numerous syncopal events dating back to her childhood.  These were brought on by standing up too quickly.  She indicated that she would feel better with immediately laying back down.  Her husband has witnessed these events and confirmed that they were brief in nature.  These events were not associated with palpitations.  After 2 pregnancies and gaining some weight episodes of syncope had  reduced in frequency however not completely resolved.  She has no known structural heart disease.  She was told during one of her pregnancies that she had a heart murmur.  Of note she has occasional migraine headaches which she treats with sumatriptan.  The medication has not triggered tachycardia or syncope.  She is seen  virtually today for follow-up evaluation and states she was reluctant to start metoprolol medication as prescribed because she was not sure if she needed it or not.  She has had 1 brief episode of accelerated heart rate since her visit to the emergency department.  She presented to her PCPs office to encouraged her to fill her prescription for metoprolol.  She describes palpitations 1-2 times per month that last for minutes before converting back to regular rate/rhythm.  She stated she maintains a physically active lifestyle and follows a healthy diet.  Her other concern with taking daily metoprolol was increased fatigue.  Her echocardiogram was reviewed and she has no questions at this time.  I will prescribe metoprolol tartrate 12.5 mg twice daily as needed and have her follow-up with Dr. Loletha Grayer in 6 months and as needed.  Today she denies chest pain, shortness of breath, lower extremity edema, fatigue, palpitations, melena, hematuria, hemoptysis, diaphoresis, weakness, presyncope, syncope, orthopnea, and PND.   The patient does not symptoms concerning for COVID-19 infection (fever, chills, cough, or new SHORTNESS OF BREATH).    Prior CV studies:   The following studies were reviewed today:  Echocardiogram 02/13/2020 FINDINGS  Left Ventricle: Left ventricular ejection fraction, by estimation, is 55  to 60%. The left ventricle has normal function. The left ventricle has no  regional wall motion abnormalities. The left ventricular internal cavity  size was normal in size. There is  no left ventricular hypertrophy. Left ventricular diastolic parameters  were normal.   Right Ventricle: The right ventricular size is normal. No increase in  right ventricular wall thickness. Right ventricular systolic function is  normal.   Left Atrium: Left atrial size was normal in size.   Right Atrium: Right atrial size was normal in size.   Pericardium: There is no evidence of pericardial effusion.   Mitral  Valve: The mitral valve is normal in structure. Normal mobility of  the mitral valve leaflets. No evidence of mitral valve regurgitation. No  evidence of mitral valve stenosis.   Tricuspid Valve: The tricuspid valve is normal in structure. Tricuspid  valve regurgitation is not demonstrated. No evidence of tricuspid  stenosis.   Aortic Valve: The aortic valve was not well visualized. Aortic valve  regurgitation is not visualized. No aortic stenosis is present.   Pulmonic Valve: The pulmonic valve was normal in structure. Pulmonic valve  regurgitation is not visualized. No evidence of pulmonic stenosis.   Aorta: The aortic root is normal in size and structure.   IAS/Shunts: No atrial level shunt detected by color flow Doppler.   Past Medical History:  Diagnosis Date  . Frequent headaches   . History of fainting spells of unknown cause   . Menstrual irregularity   . Migraines   . Placenta previa antepartum in second trimester 05/07/2018  . Positive QuantiFERON-TB Gold test    Past Surgical History:  Procedure Laterality Date  . WISDOM TOOTH EXTRACTION       Current Meds  Medication Sig  . norgestimate-ethinyl estradiol (SPRINTEC 28) 0.25-35 MG-MCG tablet Take 1 tablet by mouth daily.  . SUMAtriptan (IMITREX) 100 MG tablet TAKE 1 TABLET BY MOUTH ONCE  FOR 1 DOSE, MAY REPEAT IN 2 HOURS IF HEADACHE PERSISTS/RECURS (Patient taking differently: Take 100 mg by mouth every 2 (two) hours as needed for migraine. )  . venlafaxine XR (EFFEXOR XR) 37.5 MG 24 hr capsule Take 1 capsule (37.5 mg total) by mouth daily with breakfast.     Allergies:   Patient has no known allergies.   Social History   Tobacco Use  . Smoking status: Never Smoker  . Smokeless tobacco: Never Used  Vaping Use  . Vaping Use: Never used  Substance Use Topics  . Alcohol use: Not Currently    Comment: occasional  . Drug use: No     Family Hx: The patient's family history includes Alcohol abuse in her  maternal grandfather; Colon cancer (age of onset: 37) in her maternal aunt; Diabetes in her paternal grandmother; Healthy in her mother; High blood pressure in her father.  ROS:   Please see the history of present illness.     All other systems reviewed and are negative.   Labs/Other Tests and Data Reviewed:    Recent Labs: 03/21/2019: ALT 21 01/26/2020: Hemoglobin 13.5; Magnesium 1.7; Platelets 259 02/08/2020: BUN 11; Creat 0.68; Potassium 4.6; Sodium 138; TSH 1.41   Recent Lipid Panel Lab Results  Component Value Date/Time   CHOL 149 03/21/2019 12:44 PM   TRIG 73.0 03/21/2019 12:44 PM   HDL 68.60 03/21/2019 12:44 PM   CHOLHDL 2 03/21/2019 12:44 PM   LDLCALC 66 03/21/2019 12:44 PM    Wt Readings from Last 3 Encounters:  02/08/20 158 lb 9.6 oz (71.9 kg)  01/26/20 145 lb (65.8 kg)  07/21/19 146 lb (66.2 kg)     Exam:    Vital Signs:  LMP 01/23/2020 (Exact Date)    Well nourished, well developed female in no  acute distress.   ASSESSMENT & PLAN:    1.   Palpitations/SVT-heart rates at home maintained in the 70s-80s.  Seen in the emergency department for consultation 01/26/2020 after having an episode of palpitations at work.  Coworkers placed her on cardiac monitor which showed a heart rate of 170 bpm.  She presented to the emergency department and converted back to sinus rhythm.  She has a long history of tachypalpitations dating back to her childhood.  She was prescribed metoprolol succinate 25 mg daily and referred to EP to discuss ablation options. Start metoprolol tartrate 12.5 twice daily as needed for palpitations.  Blood pressure greater than 098 systolic. Heart healthy low-sodium diet-salty 6 given Increase physical activity as tolerated Avoid triggers caffeine, chocolate, EtOH etc.  Mitral valve prolapse-previously told during one of her pregnancies that she had cardiac murmur.  Echocardiogram showed no significant valvular abnormalities EF 55-60% Continue to  monitor.  Vasovagal presyncope-no recent episodes/events of presyncope/syncope.  Has had frequent episodes in adolescence which continued through her teen years and into her 17s.  Noticed an increase in the incidence between 20 and 20 she was doing intense athletic training.  Have not been as common recently.  Encouraged p.o. fluids and liberalization salt. Maintain p.o. fluid intake Heart healthy low-sodium diet-salty 6 given Increase physical activity as tolerated   Disposition: Follow-up with Dr. Sallyanne Kuster in 6 months.  COVID-19 Education: The signs and symptoms of COVID-19 were discussed with the patient and how to seek care for testing (follow up with PCP or arrange E-visit).  The importance of social distancing was discussed today.  Patient Risk:   After full review of this patients clinical status, I feel  that they are at least moderate risk at this time.  Time:   Today, I have spent 15 minutes with the patient with telehealth technology discussing medication, diet, exercise, echocardiogram results.  Greater than 20 minutes was spent reviewing her lab results, cardiac tests, and medications.   Medication Adjustments/Labs and Tests Ordered: Current medicines are reviewed at length with the patient today.  Concerns regarding medicines are outlined above.   Tests Ordered: No orders of the defined types were placed in this encounter.  Medication Changes: No orders of the defined types were placed in this encounter.   Disposition:  in 6 month(s)  Signed, Jossie Ng. Elva Mauro NP-C    02/01/2019 11:58 AM    [address] [place] [place] [address] Office [phone] Fax [phone]

## 2020-02-20 ENCOUNTER — Telehealth (INDEPENDENT_AMBULATORY_CARE_PROVIDER_SITE_OTHER): Payer: No Typology Code available for payment source | Admitting: General Practice

## 2020-02-20 ENCOUNTER — Encounter: Payer: Self-pay | Admitting: General Practice

## 2020-02-20 DIAGNOSIS — R55 Syncope and collapse: Secondary | ICD-10-CM | POA: Diagnosis not present

## 2020-02-20 DIAGNOSIS — R002 Palpitations: Secondary | ICD-10-CM

## 2020-02-20 DIAGNOSIS — I341 Nonrheumatic mitral (valve) prolapse: Secondary | ICD-10-CM

## 2020-02-20 MED ORDER — METOPROLOL TARTRATE 25 MG PO TABS
12.5000 mg | ORAL_TABLET | Freq: Two times a day (BID) | ORAL | 6 refills | Status: DC | PRN
Start: 2020-02-20 — End: 2020-07-30

## 2020-02-20 NOTE — Progress Notes (Signed)
Thanks Jesse!

## 2020-02-20 NOTE — Patient Instructions (Signed)
Medication Instructions:  STOP METOPROLOL SUCCINATE  START METOPROLOL TARTRATE 12.5MG  (1/2 TAB) FOR BLOOD PRESSURE >110  *If you need a refill on your cardiac medications before your next appointment, please call your pharmacy*  Special Instructions PLEASE READ AND FOLLOW HEART HEALTHY DIET-ATTACHED  PLEASE READ AND FOLLOW SALTY 6-ATTACHED  PLEASE MAINTAIN PHYSICAL ACTIVITY AS TOLERATED  Please try to avoid these triggers:  Do not use any products that have nicotine or tobacco in them. These include cigarettes, e-cigarettes, and chewing tobacco. If you need help quitting, ask your doctor.  Eat heart-healthy foods. Talk with your doctor about the right eating plan for you.  Exercise regularly as told by your doctor.  Do not drink alcohol, Caffeine or chocolate or stimulants.  Lose weight if you are overweight.  Do not use drugs, including cannabis   PLEASE INCREASE HYDRATION  Follow-Up: Your next appointment:  6 month(s) In Person with Sanda Klein, MD  At [facility], you and your health needs are our priority.  As part of our continuing mission to provide you with exceptional heart care, we have created designated Provider Care Teams.  These Care Teams include your primary Cardiologist (physician) and Advanced Practice Providers (APPs -  Physician Assistants and Nurse Practitioners) who all work together to provide you with the care you need, when you need it.  We recommend signing up for the patient portal called "MyChart".  Sign up information is provided on this After Visit Summary.  MyChart is used to connect with patients for Virtual Visits (Telemedicine).  Patients are able to view lab/test results, encounter notes, upcoming appointments, etc.  Non-urgent messages can be sent to your provider as well.   To learn more about what you can do with MyChart, go to [email or web address].      Fat and Cholesterol Restricted Eating Plan Getting too much fat and  cholesterol in your diet may cause health problems. Choosing the right foods helps keep your fat and cholesterol at normal levels. This can keep you from getting certain diseases. Your doctor may recommend an eating plan that includes:  Total fat: ______% or less of total calories a day.  Saturated fat: ______% or less of total calories a day.  Cholesterol: less than _________mg a day.  Fiber: ______g a day. What are tips for following this plan? Meal planning  At meals, divide your plate into four equal parts: ? Fill one-half of your plate with vegetables and green salads. ? Fill one-fourth of your plate with whole grains. ? Fill one-fourth of your plate with low-fat (lean) protein foods.  Eat fish that is high in omega-3 fats at least two times a week. This includes mackerel, tuna, sardines, and salmon.  Eat foods that are high in fiber, such as whole grains, beans, apples, broccoli, carrots, peas, and barley. General tips   Work with your doctor to lose weight if you need to.  Avoid: ? Foods with added sugar. ? Fried foods. ? Foods with partially hydrogenated oils.  Limit alcohol intake to no more than 1 drink a day for nonpregnant women and 2 drinks a day for men. One drink equals 12 oz of beer, 5 oz of wine, or 1 oz of hard liquor. Reading food labels  Check food labels for: ? Trans fats. ? Partially hydrogenated oils. ? Saturated fat (g) in each serving. ? Cholesterol (mg) in each serving. ? Fiber (g) in each serving.  Choose foods with healthy fats, such as: ? Monounsaturated fats. ? Polyunsaturated  fats. ? Omega-3 fats.  Choose grain products that have whole grains. Look for the word "whole" as the first word in the ingredient list. Cooking  Cook foods using low-fat methods. These include baking, boiling, grilling, and broiling.  Eat more home-cooked foods. Eat at restaurants and buffets less often.  Avoid cooking using saturated fats, such as butter,  cream, palm oil, palm kernel oil, and coconut oil. Recommended foods  Fruits  All fresh, canned (in natural juice), or frozen fruits. Vegetables  Fresh or frozen vegetables (raw, steamed, roasted, or grilled). Green salads. Grains  Whole grains, such as whole wheat or whole grain breads, crackers, cereals, and pasta. Unsweetened oatmeal, bulgur, barley, quinoa, or brown rice. Corn or whole wheat flour tortillas. Meats and other protein foods  Ground beef (85% or leaner), grass-fed beef, or beef trimmed of fat. Skinless chicken or [place]. Ground chicken or [place]. Pork trimmed of fat. All fish and seafood. Egg whites. Dried beans, peas, or lentils. Unsalted nuts or seeds. Unsalted canned beans. Nut butters without added sugar or oil. Dairy  Low-fat or nonfat dairy products, such as skim or 1% milk, 2% or reduced-fat cheeses, low-fat and fat-free ricotta or cottage cheese, or plain low-fat and nonfat yogurt. Fats and oils  Tub margarine without trans fats. Light or reduced-fat mayonnaise and salad dressings. Avocado. Olive, canola, sesame, or safflower oils. The items listed above may not be a complete list of foods and beverages you can eat. Contact a dietitian for more information. Foods to avoid Fruits  Canned fruit in heavy syrup. Fruit in cream or butter sauce. Fried fruit. Vegetables  Vegetables cooked in cheese, cream, or butter sauce. Fried vegetables. Grains  White bread. White pasta. White rice. Cornbread. Bagels, pastries, and croissants. Crackers and snack foods that contain trans fat and hydrogenated oils. Meats and other protein foods  Fatty cuts of meat. Ribs, chicken wings, bacon, sausage, bologna, salami, chitterlings, fatback, hot dogs, bratwurst, and packaged lunch meats. Liver and organ meats. Whole eggs and egg yolks. Chicken and [place] with skin. Fried meat. Dairy  Whole or 2% milk, cream, half-and-half, and cream cheese. Whole milk cheeses. Whole-fat or  sweetened yogurt. Full-fat cheeses. Nondairy creamers and whipped toppings. Processed cheese, cheese spreads, and cheese curds. Beverages  Alcohol. Sugar-sweetened drinks such as sodas, lemonade, and fruit drinks. Fats and oils  Butter, stick margarine, lard, shortening, ghee, or bacon fat. Coconut, palm kernel, and palm oils. Sweets and desserts  Corn syrup, sugars, honey, and molasses. Candy. Jam and jelly. Syrup. Sweetened cereals. Cookies, pies, cakes, donuts, muffins, and ice cream. The items listed above may not be a complete list of foods and beverages you should avoid. Contact a dietitian for more information. Summary  Choosing the right foods helps keep your fat and cholesterol at normal levels. This can keep you from getting certain diseases.  At meals, fill one-half of your plate with vegetables and green salads.  Eat high-fiber foods, like whole grains, beans, apples, carrots, peas, and barley.  Limit added sugar, saturated fats, alcohol, and fried foods. This information is not intended to replace advice given to you by your health care provider. Make sure you discuss any questions you have with your health care provider. Document Revised: 02/17/2018 Document Reviewed: 03/03/2017 Elsevier Patient Education  [place].

## 2020-02-24 ENCOUNTER — Encounter: Payer: Self-pay | Admitting: Family Medicine

## 2020-02-28 ENCOUNTER — Institutional Professional Consult (permissible substitution): Payer: No Typology Code available for payment source | Admitting: Internal Medicine

## 2020-03-21 ENCOUNTER — Ambulatory Visit: Payer: No Typology Code available for payment source | Admitting: Family Medicine

## 2020-03-29 ENCOUNTER — Other Ambulatory Visit: Payer: Self-pay | Admitting: Family Medicine

## 2020-03-29 DIAGNOSIS — G43809 Other migraine, not intractable, without status migrainosus: Secondary | ICD-10-CM

## 2020-05-16 MED FILL — VENLAFAXINE HCL ER 37.5 MG: 37.5 | 90 days supply | Qty: 90 | Fill #1

## 2020-06-20 ENCOUNTER — Other Ambulatory Visit (HOSPITAL_COMMUNITY)
Admission: RE | Admit: 2020-06-20 | Discharge: 2020-06-20 | Disposition: A | Discharge status: Home / Self Care | Payer: No Typology Code available for payment source | Source: Ambulatory Visit | Attending: Certified Nurse Midwife | Admitting: Certified Nurse Midwife

## 2020-06-20 ENCOUNTER — Encounter: Payer: Self-pay | Admitting: Certified Nurse Midwife

## 2020-06-20 ENCOUNTER — Other Ambulatory Visit: Payer: Self-pay

## 2020-06-20 ENCOUNTER — Ambulatory Visit (INDEPENDENT_AMBULATORY_CARE_PROVIDER_SITE_OTHER): Payer: No Typology Code available for payment source | Admitting: Certified Nurse Midwife

## 2020-06-20 ENCOUNTER — Encounter: Payer: No Typology Code available for payment source | Admitting: Certified Nurse Midwife

## 2020-06-20 VITALS — BP 126/83 | HR 93 | Ht 68.0 in | Wt 162.1 lb

## 2020-06-20 DIAGNOSIS — Z01419 Encounter for gynecological examination (general) (routine) without abnormal findings: Secondary | ICD-10-CM

## 2020-06-20 DIAGNOSIS — Z124 Encounter for screening for malignant neoplasm of cervix: Secondary | ICD-10-CM

## 2020-06-20 NOTE — Progress Notes (Signed)
GYNECOLOGY ANNUAL PREVENTATIVE CARE ENCOUNTER NOTE  History:     Janet Thompson is a 29 y.o. [phone] female here for a routine annual gynecologic exam.  Current complaints: none.   Denies abnormal vaginal bleeding, discharge, pelvic pain, problems with intercourse or other gynecologic concerns.     Social Relationship: married  Living:with children and husband Work: 2 days a week at hospital  Exercise:daily , platon bike Smoke/Alcohol/drug use: occ alcohol.   Gynecologic History Patient's last menstrual period was 06/13/2020. Contraception: none and husband getting vasectomy Feb. Last Pap: 06/20/19. Results were: normal with Pos high risk HPV Last mammogram: n/a  The pregnancy intention screening data noted above was reviewed. Potential methods of contraception were discussed. The patient elected to proceed with No Method - Other Reason.    Obstetric History OB History  Gravida Para Term Preterm AB Living  2 2 2  0 0 2  SAB IAB Ectopic Multiple Live Births  0 0 0 0 2    # Outcome Date GA Lbr Len/2nd Weight Sex Delivery Anes PTL Lv  2 Term 11/04/18 [redacted]w[redacted]d / 00:31 8 lb 9 oz (3.884 kg) M Vag-Spont EPI N LIV  1 Term 06/24/15 [redacted]w[redacted]d 10:50 / 01:39 8 lb 4.6 oz (3.76 kg) M Vag-Spont EPI  LIV    Past Medical History:  Diagnosis Date  . Frequent headaches   . History of fainting spells of unknown cause   . Menstrual irregularity   . Migraines   . Placenta previa antepartum in second trimester 05/07/2018  . 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 WISDOM TOOTH EXTRACTION      Current Outpatient Medications on File Prior to Visit  Medication Sig Dispense Refill  . SUMAtriptan (IMITREX) 100 MG tablet TAKE 1 TABLET BY MOUTH ONCE FOR 1 DOSE, MAY REPEAT IN 2 HOURS IF HEADACHE PERSISTS/RECURS 9 tablet 2  . venlafaxine XR (EFFEXOR XR) 37.5 MG 24 hr capsule Take 1 capsule (37.5 mg total) by mouth daily with breakfast. 90 capsule  1  . metoprolol tartrate (LOPRESSOR) 25 MG tablet Take 0.5 tablets (12.5 mg total) by mouth 2 (two) times daily as needed (SBP >110). 60 tablet 6  . norgestimate-ethinyl estradiol (SPRINTEC 28) 0.25-35 MG-MCG tablet Take 1 tablet by mouth daily. 1 Package 5   No current facility-administered medications on file prior to visit.    No Known Allergies  Social History:  reports that she has never smoked. She has never used smokeless tobacco. She reports previous alcohol use. She reports that she does not use drugs.  Family History  Problem Relation Age of Onset  . Colon cancer Maternal Aunt 50  . Alcohol abuse Maternal Grandfather        colon  . Diabetes Paternal Grandmother   . Healthy Mother        heavy menses; early hysterectomy  . High blood pressure Father     The following portions of the patient's history were reviewed and updated as appropriate: allergies, current medications, past family history, past medical history, past social history, past surgical history and problem list.  Review of Systems Pertinent items noted in HPI and remainder of comprehensive ROS otherwise negative.  Physical Exam:  BP 126/83   Pulse 93   Ht 5\' 8"  (1.727 m)   Wt 162 lb 1 oz (73.5 kg)   LMP 06/13/2020   BMI 24.64 kg/m  CONSTITUTIONAL: Well-developed, well-nourished female in no acute distress.  HENT:  Normocephalic, atraumatic, External right and left ear normal. Oropharynx is clear and moist EYES: Conjunctivae and EOM are normal. Pupils are equal, round, and reactive to light. No scleral icterus.  NECK: Normal range of motion, supple, no masses.  Normal thyroid.  SKIN: Skin is warm and dry. No rash noted. Not diaphoretic. No erythema. No pallor. MUSCULOSKELETAL: Normal range of motion. No tenderness.  No cyanosis, clubbing, or edema.  2+ distal pulses. NEUROLOGIC: Alert and oriented to person, place, and time. Normal reflexes, muscle tone coordination.  PSYCHIATRIC: Normal mood and  affect. Normal behavior. Normal judgment and thought content. CARDIOVASCULAR: Normal heart rate noted, regular rhythm RESPIRATORY: Clear to auscultation bilaterally. Effort and breath sounds normal, no problems with respiration noted. BREASTS: Symmetric in size. No masses, tenderness, skin changes, nipple drainage, or lymphadenopathy bilaterally.  ABDOMEN: Soft, no distention noted.  No tenderness, rebound or guarding.  PELVIC: Normal appearing external genitalia and urethral meatus; normal appearing vaginal mucosa and cervix.  No abnormal discharge noted.  Pap smear obtained. Contact bleeding present. Normal uterine size, no other palpable masses, no uterine or adnexal tenderness.  .   Assessment and Plan:    1. Women's annual routine gynecological examination  . Pap:Will follow up results of pap smear and manage accordingly Mammogram :n/a Labs:none Refills:none Referral:none Routine preventative health maintenance measures emphasized. Please refer to After Visit Summary for other counseling recommendations.      Philip Aspen, CNM [facility]

## 2020-06-20 NOTE — Patient Instructions (Signed)
Preventive Care 21-31 Years Old, Female Preventive care refers to visits with your health care provider and lifestyle choices that can promote health and wellness. This includes:  A yearly physical exam. This may also be called an annual well check.  Regular dental visits and eye exams.  Immunizations.  Screening for certain conditions.  Healthy lifestyle choices, such as eating a healthy diet, getting regular exercise, not using drugs or products that contain nicotine and tobacco, and limiting alcohol use. What can I expect for my preventive care visit? Physical exam Your health care provider will check your:  Height and weight. This may be used to calculate body mass index (BMI), which tells if you are at a healthy weight.  Heart rate and blood pressure.  Skin for abnormal spots. Counseling Your health care provider may ask you questions about your:  Alcohol, tobacco, and drug use.  Emotional well-being.  Home and relationship well-being.  Sexual activity.  Eating habits.  Work and work environment.  Method of birth control.  Menstrual cycle.  Pregnancy history. What immunizations do I need?  Influenza (flu) vaccine  This is recommended every year. Tetanus, diphtheria, and pertussis (Tdap) vaccine  You may need a Td booster every 10 years. Varicella (chickenpox) vaccine  You may need this if you have not been vaccinated. Human papillomavirus (HPV) vaccine  If recommended by your health care provider, you may need three doses over 6 months. Measles, mumps, and rubella (MMR) vaccine  You may need at least one dose of MMR. You may also need a second dose. Meningococcal conjugate (MenACWY) vaccine  One dose is recommended if you are age 19-21 years and a first-year college student living in a residence hall, or if you have one of several medical conditions. You may also need additional booster doses. Pneumococcal conjugate (PCV13) vaccine  You may need  this if you have certain conditions and were not previously vaccinated. Pneumococcal polysaccharide (PPSV23) vaccine  You may need one or two doses if you smoke cigarettes or if you have certain conditions. Hepatitis A vaccine  You may need this if you have certain conditions or if you travel or work in places where you may be exposed to hepatitis A. Hepatitis B vaccine  You may need this if you have certain conditions or if you travel or work in places where you may be exposed to hepatitis B. Haemophilus influenzae type b (Hib) vaccine  You may need this if you have certain conditions. You may receive vaccines as individual doses or as more than one vaccine together in one shot (combination vaccines). Talk with your health care provider about the risks and benefits of combination vaccines. What tests do I need?  Blood tests  Lipid and cholesterol levels. These may be checked every 5 years starting at age 20.  Hepatitis C test.  Hepatitis B test. Screening  Diabetes screening. This is done by checking your blood sugar (glucose) after you have not eaten for a while (fasting).  Sexually transmitted disease (STD) testing.  BRCA-related cancer screening. This may be done if you have a family history of breast, ovarian, tubal, or peritoneal cancers.  Pelvic exam and Pap test. This may be done every 3 years starting at age 21. Starting at age 30, this may be done every 5 years if you have a Pap test in combination with an HPV test. Talk with your health care provider about your test results, treatment options, and if necessary, the need for more tests.   Follow these instructions at home: Eating and drinking   Eat a diet that includes fresh fruits and vegetables, whole grains, lean protein, and low-fat dairy.  Take vitamin and mineral supplements as recommended by your health care provider.  Do not drink alcohol if: ? Your health care provider tells you not to drink. ? You are  pregnant, may be pregnant, or are planning to become pregnant.  If you drink alcohol: ? Limit how much you have to 0-1 drink a day. ? Be aware of how much alcohol is in your drink. In the [place] one drink equals one 12 oz bottle of beer (355 mL), one 5 oz glass of wine (148 mL), or one 1 oz glass of hard liquor (44 mL). Lifestyle  Take daily care of your teeth and gums.  Stay active. Exercise for at least 30 minutes on 5 or more days each week.  Do not use any products that contain nicotine or tobacco, such as cigarettes, e-cigarettes, and chewing tobacco. If you need help quitting, ask your health care provider.  If you are sexually active, practice safe sex. Use a condom or other form of birth control (contraception) in order to prevent pregnancy and STIs (sexually transmitted infections). If you plan to become pregnant, see your health care provider for a preconception visit. What's next?  Visit your health care provider once a year for a well check visit.  Ask your health care provider how often you should have your eyes and teeth checked.  Stay up to date on all vaccines. This information is not intended to replace advice given to you by your health care provider. Make sure you discuss any questions you have with your health care provider. Document Revised: 02/25/2018 Document Reviewed: 02/25/2018 Elsevier Patient Education  2020 [employer].

## 2020-06-30 NOTE — L&D Delivery Note (Signed)
       Delivery Note   Lauran Sundet is a 30 y.o. G3P2002 at 83w6dEstimated Date of Delivery: 03/20/21  PRE-OPERATIVE DIAGNOSIS:  1) 331w6dregnancy.    POST-OPERATIVE DIAGNOSIS:  1) 3826w6degnancy s/p Vaginal, Spontaneous    Delivery Type: Vaginal, Spontaneous    Delivery Anesthesia: Epidural   Labor Complications:  none    ESTIMATED BLOOD LOSS: 250  ml    FINDINGS:   1) female infant, Apgar scores of    at 1 minute and    at 5 minutes and a birthweight of   ounces.    2) Nuchal cord: yes, loose , easily reduced   SPECIMENS:   PLACENTA:   Appearance: Intact , 3 vessel cord    Removal: Spontaneous      Disposition:  per protocol   DISPOSITION:  Infant to left in stable condition in the delivery room, with L&D personnel and mother,  NARRATIVE SUMMARY: Labor course:  Ms. NicAaliyahmarie Jayson a [id number] 38w54w6d presented for labor management.  She progressed well in labor with pitocin.  She received the appropriate anesthesia and proceeded to complete dilation. She evidenced good maternal expulsive effort during the second stage. She went on to deliver a viable female infant "JackBarnabas ListerThe placenta delivered without problems and was noted to be complete. A perineal and vaginal examination was performed. Lacerations: None . The patient tolerated this well.  AnniPhilip AspenM  03/12/2021 8:16 PM

## 2020-07-08 LAB — CYTOLOGY - PAP
Comment: NEGATIVE
Comment: NEGATIVE
Diagnosis: UNDETERMINED — AB
HPV 16: NEGATIVE
HPV 18 / 45: POSITIVE — AB
High risk HPV: POSITIVE — AB

## 2020-07-14 ENCOUNTER — Encounter: Payer: Self-pay | Admitting: Family Medicine

## 2020-07-18 ENCOUNTER — Encounter: Payer: Self-pay | Admitting: Certified Nurse Midwife

## 2020-07-18 ENCOUNTER — Other Ambulatory Visit: Payer: Self-pay

## 2020-07-18 ENCOUNTER — Ambulatory Visit (INDEPENDENT_AMBULATORY_CARE_PROVIDER_SITE_OTHER): Payer: No Typology Code available for payment source | Admitting: Certified Nurse Midwife

## 2020-07-18 ENCOUNTER — Telehealth: Payer: Self-pay

## 2020-07-18 VITALS — BP 117/78 | HR 95 | Ht 68.0 in | Wt 160.0 lb

## 2020-07-18 DIAGNOSIS — N926 Irregular menstruation, unspecified: Secondary | ICD-10-CM

## 2020-07-18 LAB — POCT URINE PREGNANCY: Preg Test, Ur: POSITIVE — AB

## 2020-07-18 NOTE — Telephone Encounter (Signed)
Patient can keep scheduled colposcopy, but will not collect biopsies while pregnant. Will repeat colposcopy with biopsy at three (3) months postpartum. Thanks, JML

## 2020-07-18 NOTE — Progress Notes (Signed)
Subjective:    Janet Thompson is a 30 y.o. female who presents for evaluation of amenorrhea. She believes she could be pregnant. Pregnancy is desired. Sexual Activity: single partner, contraception: none. Current symptoms also include: fatigue and positive home pregnancy test. Last period was normal.   Patient's last menstrual period was 06/13/2020 (approximate). The following portions of the patient's history were reviewed and updated as appropriate: allergies, current medications, past family history, past medical history, past social history, past surgical history and problem list.  Review of Systems Pertinent items are noted in HPI.     Objective:    BP 117/78   Pulse 95   Ht 5\' 8"  (1.727 m)   Wt 160 lb (72.6 kg)   LMP 06/13/2020 (Approximate)   Breastfeeding No   BMI 24.33 kg/m  General: alert, cooperative, appears stated age, no distress and no acute distress    Lab Review Urine HCG: positive    Assessment:    Absence of menstruation.     Plan:    Pregnancy TestPositive: EDC: 03/20/2021 Briefly discussed pre-natal care options. MD or Midwifery care. Pt desires to see midwives. . Encouraged well-balanced diet, plenty of rest when needed, pre-natal vitamins daily and walking for exercise. Discussed self-help for nausea, avoiding OTC medications until consulting provider or pharmacist, other than Tylenol as needed, minimal caffeine (1-2 cups daily) and avoiding alcohol. She will schedule  U/s for dating and viability in 2 wks, nurse visit @ 10 wks and her initial NOB visit in @ 12 wks . Feel free to call with any questions.   Philip Aspen, CNM

## 2020-07-18 NOTE — Telephone Encounter (Signed)
I called pt and informed her that we will need to keep her colpo. The pt verbally understood.

## 2020-07-18 NOTE — Telephone Encounter (Signed)
Pt was checking on and wanted to know how that she is pregnant if she still need to have her colpo. I told her that I will send a message to the provider, and I would let her know. Please advise

## 2020-07-18 NOTE — Patient Instructions (Signed)
https://www.[facility].org/womens-health/faqs/prenatal-genetic-screening-tests">  Prenatal Care Prenatal care is health care during pregnancy. It helps you and your unborn baby (fetus) stay as healthy as possible. Prenatal care may be provided by a midwife, a family practice doctor, a IT consultant (nurse practitioner or physician assistant), or a childbirth and pregnancy doctor (obstetrician). How does this affect me? During pregnancy, you will be closely monitored for any new conditions that might develop. To lower your risk of pregnancy complications, you and your health care provider will talk about any underlying conditions you have. How does this affect my baby? Early and consistent prenatal care increases the chance that your baby will be healthy during pregnancy. Prenatal care lowers the risk that your baby will be:  Born early (prematurely).  Smaller than expected at birth (small for gestational age). What can I expect at the first prenatal care visit? Your first prenatal care visit will likely be the longest. You should schedule your first prenatal care visit as soon as you know that you are pregnant. Your first visit is a good time to talk about any questions or concerns you have about pregnancy. Medical history At your visit, you and your health care provider will talk about your medical history, including:  Any past pregnancies.  Your family's medical history.  Medical history of the baby's father.  Any long-term (chronic) health conditions you have and how you manage them.  Any surgeries or procedures you have had.  Any current over-the-counter or prescription medicines, herbs, or supplements that you are taking.  Other factors that could pose a risk to your baby, including: ? Exposure to harmful chemicals or radiation at work or at home. ? Any substance use, including tobacco, alcohol, and drug use.  Your home setting and your stress levels, including: ? Exposure to  abuse or violence. ? Household financial strain.  Your daily health habits, including diet and exercise. Tests and screenings Your health care provider will:  Measure your weight, height, and blood pressure.  Do a physical exam, including a pelvic and breast exam.  Perform blood tests and urine tests to check for: ? Urinary tract infection. ? Sexually transmitted infections (STIs). ? Low iron levels in your blood (anemia). ? Blood type and certain proteins on red blood cells (Rh antibodies). ? Infections and immunity to viruses, such as hepatitis B and rubella. ? HIV (human immunodeficiency virus).  Discuss your options for genetic screening. Tips about staying healthy Your health care provider will also give you information about how to keep yourself and your baby healthy, including:  Nutrition and taking vitamins.  Physical activity.  How to manage pregnancy symptoms such as nausea and vomiting (morning sickness).  Infections and substances that may be harmful to your baby and how to avoid them.  Food safety.  Dental care.  Working.  Travel.  Warning signs to watch for and when to call your health care provider. How often will I have prenatal care visits? After your first prenatal care visit, you will have regular visits throughout your pregnancy. The visit schedule is often as follows:  Up to week 28 of pregnancy: once every 4 weeks.  28-36 weeks: once every 2 weeks.  After 36 weeks: every week until delivery. Some women may have visits more or less often depending on any underlying health conditions and the health of the baby. Keep all follow-up and prenatal care visits. This is important. What happens during routine prenatal care visits? Your health care provider will:  Measure your weight  and blood pressure.  Check for fetal heart sounds.  Measure the height of your uterus in your abdomen (fundal height). This may be measured starting around week 20 of  pregnancy.  Check the position of your baby inside your uterus.  Ask questions about your diet, sleeping patterns, and whether you can feel the baby move.  Review warning signs to watch for and signs of labor.  Ask about any pregnancy symptoms you are having and how you are dealing with them. Symptoms may include: ? Headaches. ? Nausea and vomiting. ? Vaginal discharge. ? Swelling. ? Fatigue. ? Constipation. ? Changes in your vision. ? Feeling persistently sad or anxious. ? Any discomfort, including back or pelvic pain. ? Bleeding or spotting. Make a list of questions to ask your health care provider at your routine visits.   What tests might I have during prenatal care visits? You may have blood, urine, and imaging tests throughout your pregnancy, such as:  Urine tests to check for glucose, protein, or signs of infection.  Glucose tests to check for a form of diabetes that can develop during pregnancy (gestational diabetes mellitus). This is usually done around week 24 of pregnancy.  Ultrasounds to check your baby's growth and development, to check for birth defects, and to check your baby's well-being. These can also help to decide when you should deliver your baby.  A test to check for group B strep (GBS) infection. This is usually done around week 36 of pregnancy.  Genetic testing. This may include blood, fluid, or tissue sampling, or imaging tests, such as an ultrasound. Some genetic tests are done during the first trimester and some are done during the second trimester. What else can I expect during prenatal care visits? Your health care provider may recommend getting certain vaccines during pregnancy. These may include:  A yearly flu shot (annual influenza vaccine). This is especially important if you will be pregnant during flu season.  Tdap (tetanus, diphtheria, pertussis) vaccine. Getting this vaccine during pregnancy can protect your baby from whooping cough  (pertussis) after birth. This vaccine may be recommended between weeks 27 and 36 of pregnancy.  A COVID-19 vaccine. Later in your pregnancy, your health care provider may give you information about:  Childbirth and breastfeeding classes.  Choosing a health care provider for your baby.  Umbilical cord banking.  Breastfeeding.  Birth control after your baby is born.  The hospital labor and delivery unit and how to set up a tour.  Registering at the hospital before you go into labor. Where to find more information  Office on Women's Health: LegalWarrants.gl  American Pregnancy Association: americanpregnancy.org  March of Dimes: marchofdimes.org Summary  Prenatal care helps you and your baby stay as healthy as possible during pregnancy.  Your first prenatal care visit will most likely be the longest.  You will have visits and tests throughout your pregnancy to monitor your health and your baby's health.  Bring a list of questions to your visits to ask your health care provider.  Make sure to keep all follow-up and prenatal care visits. This information is not intended to replace advice given to you by your health care provider. Make sure you discuss any questions you have with your health care provider. Document Revised: 03/29/2020 Document Reviewed: 03/29/2020 Elsevier Patient Education  [place].

## 2020-07-19 ENCOUNTER — Encounter: Payer: No Typology Code available for payment source | Admitting: Obstetrics and Gynecology

## 2020-07-19 ENCOUNTER — Other Ambulatory Visit: Payer: Self-pay | Admitting: Family Medicine

## 2020-07-19 MED ORDER — VENLAFAXINE HCL 25 MG PO TABS: ORAL_TABLET | ORAL | 0 refills | Status: DC

## 2020-07-19 MED FILL — VENLAFAXINE HCL 25 MG TAB: 25 | 25 days supply | Qty: 15 | Fill #0

## 2020-07-20 ENCOUNTER — Ambulatory Visit: Payer: No Typology Code available for payment source | Admitting: Family Medicine

## 2020-07-30 ENCOUNTER — Ambulatory Visit (INDEPENDENT_AMBULATORY_CARE_PROVIDER_SITE_OTHER): Payer: No Typology Code available for payment source | Admitting: Family Medicine

## 2020-07-30 ENCOUNTER — Encounter: Payer: Self-pay | Admitting: Family Medicine

## 2020-07-30 ENCOUNTER — Other Ambulatory Visit: Payer: Self-pay

## 2020-07-30 VITALS — BP 116/68 | HR 95 | Temp 98.1°F | Ht 68.0 in | Wt 161.0 lb

## 2020-07-30 DIAGNOSIS — G43809 Other migraine, not intractable, without status migrainosus: Secondary | ICD-10-CM

## 2020-07-30 MED ORDER — BUTALBITAL-APAP-CAFFEINE 50-325-40 MG PO TABS: 1.0000 | ORAL_TABLET | Freq: Every day | ORAL | 0 refills | Status: DC | PRN

## 2020-07-30 MED ORDER — PRENATAL + COMPLETE MULTI 0.267 & 373 MG PO THPK: 1.0000 | PACK | Freq: Every day | ORAL | Status: DC

## 2020-07-30 NOTE — Progress Notes (Signed)
Janet Thompson DOB: [birth year] Encounter date: 07/30/2020  This is a 30 y.o. female who presents with Chief Complaint  Patient presents with  . Follow-up    History of present illness: Stopped effexor Saturday night. Has felt ok. Feeling nauseated but thinks related to early taper. Did so well with improvement in migraines after starting effexor; she was worried about getting on something else.   Mood has been ok. She did well with mood during other pregnancies - just tired.   Since being on effexor consistently felt like a normal person - just slept well, refreshed.   Hasn't had any more episodes of SVT; hasn't taken the metoprolol.   She is due 9/21.  No Known Allergies Current Meds  Medication Sig  . butalbital-acetaminophen-caffeine (FIORICET) 50-325-40 MG tablet Take 1-2 tablets by mouth daily as needed for migraine.  . Prenat-Methylfol-Chol-Fish Oil (PRENATAL + COMPLETE MULTI) 0.267 & 373 MG THPK Take 1 tablet by mouth daily.  . [DISCONTINUED] SUMAtriptan (IMITREX) 100 MG tablet TAKE 1 TABLET BY MOUTH ONCE FOR 1 DOSE, MAY REPEAT IN 2 HOURS IF HEADACHE PERSISTS/RECURS  . [DISCONTINUED] venlafaxine (EFFEXOR) 25 MG tablet Take 1 tablet PO daily x 5 days, then 1/2 tablet PO daily x5 days then stop    Review of Systems  Constitutional: Negative for chills, fatigue and fever.  Respiratory: Negative for cough, chest tightness, shortness of breath and wheezing.   Cardiovascular: Negative for chest pain, palpitations and leg swelling.    Objective:  BP 116/68 (BP Location: Right Arm, Patient Position: Sitting, Cuff Size: Normal)   Pulse 95   Temp 98.1 F (36.7 C) (Oral)   Ht 5\' 8"  (1.727 m)   Wt 161 lb (73 kg)   LMP 06/13/2020 (Approximate)   BMI 24.48 kg/m   Weight: 161 lb (73 kg)   BP Readings from Last 3 Encounters:  07/30/20 116/68  07/18/20 117/78  06/20/20 126/83   Wt Readings from Last 3 Encounters:  07/30/20 161 lb (73 kg)  07/18/20 160 lb (72.6 kg)   06/20/20 162 lb 1 oz (73.5 kg)    Physical Exam Constitutional:      General: She is not in acute distress.    Appearance: She is well-developed.  Cardiovascular:     Rate and Rhythm: Normal rate and regular rhythm.     Heart sounds: Normal heart sounds. No murmur heard. No friction rub.  Pulmonary:     Effort: Pulmonary effort is normal. No respiratory distress.     Breath sounds: Normal breath sounds. No wheezing or rales.  Musculoskeletal:     Right lower leg: No edema.     Left lower leg: No edema.  Neurological:     Mental Status: She is alert and oriented to person, place, and time.  Psychiatric:        Behavior: Behavior normal.     Assessment/Plan  1. Other migraine without status migrainosus, not intractable There is not a great alternative preventative treatment that is safe in pregnancy. She is good about regular exercise, staying well hydrated. I discussed considering things like pregnancy massage to help from stress level. She will try tylenol with headaches and if that doesn't work can take fioricet. She will limit all medication during pregnancy but this med was discussed at gynecology visit and would be next safest step up for treatment if needed as her migraines are severe.  - butalbital-acetaminophen-caffeine (FIORICET) 50-325-40 MG tablet; Take 1-2 tablets by mouth daily as needed for migraine.  Dispense: 30 tablet; Refill: 0    Return if symptoms worsen or fail to improve.  33 minutes spent in chart review, time with patient, charting. We discussed SSRI options, but that none are really labeled for preventative migraine use. New injectable migraine treatments are not as well studied in pregnancy. Encouraged to track headaches and let me know how she does with these.   Micheline Rough, MD

## 2020-07-30 NOTE — Patient Instructions (Signed)
[facility] (support recovery compression)

## 2020-08-01 ENCOUNTER — Other Ambulatory Visit: Payer: No Typology Code available for payment source

## 2020-08-07 ENCOUNTER — Other Ambulatory Visit: Payer: Self-pay

## 2020-08-07 ENCOUNTER — Ambulatory Visit (INDEPENDENT_AMBULATORY_CARE_PROVIDER_SITE_OTHER): Payer: No Typology Code available for payment source

## 2020-08-07 DIAGNOSIS — Z3481 Encounter for supervision of other normal pregnancy, first trimester: Secondary | ICD-10-CM | POA: Diagnosis not present

## 2020-08-07 DIAGNOSIS — Z3A08 8 weeks gestation of pregnancy: Secondary | ICD-10-CM

## 2020-08-07 DIAGNOSIS — N926 Irregular menstruation, unspecified: Secondary | ICD-10-CM

## 2020-08-16 ENCOUNTER — Other Ambulatory Visit: Payer: Self-pay

## 2020-08-16 ENCOUNTER — Encounter: Payer: Self-pay | Admitting: Certified Nurse Midwife

## 2020-08-16 ENCOUNTER — Ambulatory Visit (INDEPENDENT_AMBULATORY_CARE_PROVIDER_SITE_OTHER): Payer: No Typology Code available for payment source | Admitting: Certified Nurse Midwife

## 2020-08-16 VITALS — BP 126/81 | HR 96 | Resp 16 | Ht 68.0 in | Wt 158.4 lb

## 2020-08-16 DIAGNOSIS — R87610 Atypical squamous cells of undetermined significance on cytologic smear of cervix (ASC-US): Secondary | ICD-10-CM | POA: Diagnosis not present

## 2020-08-16 DIAGNOSIS — R87810 Cervical high risk human papillomavirus (HPV) DNA test positive: Secondary | ICD-10-CM | POA: Diagnosis not present

## 2020-08-16 DIAGNOSIS — O210 Mild hyperemesis gravidarum: Secondary | ICD-10-CM

## 2020-08-16 MED ORDER — DOXYLAMINE-PYRIDOXINE 10-10 MG PO TBEC: 2.0000 | DELAYED_RELEASE_TABLET | Freq: Every day | ORAL | 5 refills | Status: DC

## 2020-08-16 NOTE — Progress Notes (Signed)
[facility] COLPOSCOPY PROCEDURE NOTE  30 y.o. [id number] here for colposcopy for ASCUS with POSITIVE high risk HPV, Types 18/45 pap smear on 06/20/2020. Discussed role for HPV in cervical dysplasia, need for surveillance.  Patient given informed consent, signed copy in the chart, time out was performed.  Placed in lithotomy position. Cervix viewed with speculum and colposcope after application of acetic acid.   Colposcopy adequate? Yes   Acetowhite lesion(s) noted at four (4) o'clock; Neither biopsies nor ECC specimen were collected due to early pregnancy; see chart.    Patient was given post procedure instructions.   Continue routine prenatal care as scheduled. Rx Diclegis, see orders.    Dani Gobble, CNM [facility], [facility] 08/16/20 10:08 AM

## 2020-08-16 NOTE — Patient Instructions (Signed)
[email or web address]  Colposcopy  Colposcopy is a procedure to examine the lowest part of the uterus (cervix), the vagina, and the area around the vaginal opening (vulva) for abnormalities or signs of disease. This procedure is done using an instrument that makes objects appear larger and provides light. (colposcope). During the procedure, the health care provider may remove a tissue sample to be looked at later under a microscope (biopsy). A biopsy may be done if any unusual cells are found during the colposcopy. You may have a colposcopy if you have:  An abnormal Pap smear, also called a Pap test. This screening test is used to check for signs of cancer or infection of the vagina, cervix, and uterus.  An HPV (human papillomavirus) test and get a positive result for a type of HPV that puts you at high risk of cancer.  Certain conditions or symptoms, such as: ? A sore, or lesion, on your cervix. ? Genital warts on your vulva, vagina, or cervix. ? Pain during sex. ? Vaginal bleeding, especially after sex.  A growth on your cervix (cervical polyp) that needs to be removed. Let your health care provider know about:  Any allergies you have, including allergies to medicines, latex, or iodine.  All medicines you are taking, including vitamins, herbs, eye drops, creams, and over-the-counter medicines.  Any problems you or family members have had with anesthetic medicines.  Any blood disorders you have.  Any surgeries you have had.  Any medical conditions you have, such as pelvic inflammatory disease (PID) or endometrial disorder.  The pattern of your menstrual cycles and the form of birth control (contraception) you use, if any.  Your medical history, including any history of fainting often or of cervical treatment.  Whether you are pregnant or may be pregnant. What are the risks? Generally, this is a safe  procedure. However, problems may occur, including:  Infection. Symptoms of infection may include fever, bad-smelling vaginal discharge, or pelvic pain.  Allergic reactions to medicines.  Damage to nearby structures or organs.  Fainting. This is rare. What happens before the procedure? Eating and drinking restrictions  Follow instructions from your health care provider about eating or drinking restrictions.  You will likely need to eat a regular diet the day of the procedure and not skip any meals. Tests  You may have an exam or testing. A pregnancy test will be done the day of the procedure.  You may have a blood or urine sample taken. General instructions  Ask your health care provider about: ? Changing or stopping your regular medicines. This is especially important if you are taking diabetes medicines or blood thinners. ? Taking medicines such as aspirin and ibuprofen. These medicines can thin your blood. Do not take these medicines unless your health care provider tells you to take them. Your health care provider will likely tell you to avoid taking aspirin, or medicine that contains aspirin, for 7 days before the procedure. ? Taking over-the-counter medicines, vitamins, herbs, and supplements.  Tell your health care provider if you have your menstrual period now or will have it at the time of your procedure. A colposcopy is not normally done during your menstrual period.  If you use contraception, continue to use it before your procedure.  For 24 hours before the procedure: ? Do not use douche products or tampons. ? Do not use medicines, creams, or suppositories in the vagina. ? Do not have sex.  Ask your health care provider what steps will be  taken to prevent infection. What happens during the procedure?  You will lie down on your back, with your feet in foot rests (stirrups).  A tool called a speculum will be warmed and will have oil or gel put on it (will be  lubricated). The speculum will then be inserted into your vagina. This will be used to hold apart the walls of your vagina so your health care provider can see your cervix and the inside of your vagina.  A cotton swab will be used to place a small amount of a liquid (solution) on the areas to be examined. This solution makes it easier to see abnormal cells. You may feel a slight burning during this part.  The colposcope will be used to scan the cervix with a bright white light. The colposcope will be held near your vulva and will make your vulva, vagina, and cervix look bigger so they can be seen better.  If a biopsy is needed: ? You may be given a medicine to numb the area (local anesthetic). ? Surgical tools will be used to remove mucus and cells through your vagina. ? You may feel mild pain while the tissue sample is removed. ? Bleeding may occur. A solution may be used to stop the bleeding. ? If a biopsy is needed from the inside of the cervix, a different procedure called endocervical curettage (ECC) may be done. During this procedure, a curved tool called a curette will be used to scrape cells from your cervix or the top of your cervix (endocervix).  Any abnormalities that are found will be recorded. The procedure may vary among health care providers and hospitals. What happens after the procedure?  You will lie down and rest for a few minutes. You may be offered juice or cookies.  Your blood pressure, heart rate, breathing rate, and blood oxygen level will be monitored until you leave the hospital or clinic.  You may have some cramping in your abdomen. This should go away after a few minutes.  It is up to you to get the results of your procedure. Ask your health care provider, or the department that is doing the procedure, when your results will be ready. Summary  Colposcopy is a procedure to examine the lowest part of the uterus (cervix), the vagina, and the area around the vaginal  opening (vulva) for abnormalities or signs of disease.  A biopsy may be done as part of the procedure.  After the procedure, you will remain lying down and will rest for a few minutes.  You may have some cramping in your abdomen. This should go away after a few minutes. This information is not intended to replace advice given to you by your health care provider. Make sure you discuss any questions you have with your health care provider. Document Revised: 06/15/2019 Document Reviewed: 06/15/2019 Elsevier Patient Education  2021 [employer].

## 2020-08-23 ENCOUNTER — Other Ambulatory Visit: Payer: Self-pay

## 2020-08-23 ENCOUNTER — Ambulatory Visit (INDEPENDENT_AMBULATORY_CARE_PROVIDER_SITE_OTHER): Payer: No Typology Code available for payment source | Admitting: Certified Nurse Midwife

## 2020-08-23 VITALS — BP 109/71 | HR 94 | Ht 68.0 in | Wt 158.1 lb

## 2020-08-23 DIAGNOSIS — Z3481 Encounter for supervision of other normal pregnancy, first trimester: Secondary | ICD-10-CM

## 2020-08-23 DIAGNOSIS — O210 Mild hyperemesis gravidarum: Secondary | ICD-10-CM

## 2020-08-23 DIAGNOSIS — Z3A10 10 weeks gestation of pregnancy: Secondary | ICD-10-CM

## 2020-08-23 NOTE — Progress Notes (Signed)
Janit Pagan presents for [facility] nurse interview visit. Pregnancy confirmation done  07/18/2020 with [facility].  U/s on 2/8 shows [facility] at 8 weeks - EDD- 02/2021.  G- .3  P-2002 . Pregnancy education material explained and given. _0__ cats in the home. NOB labs ordered. HIV labs and Drug screen were explained and ordered. PNV encouraged. Genetic screening options discussed. Genetic testing: to be drawn at  [facility]. FMLA form reviewed and signed. Financial policy reviewed. Pt c/o of nausea. Has Diclegis rx but is not taking it consistently. Advised to take daily for a week with no more than 4 tabs a day. Stay hydrated - water, ice chips, ginger ale. Contact office if not getting relief. Pt voiced understanding.   Pt. to follow up with provider in _2_ weeks for NOB physical.  All questions answered.

## 2020-08-23 NOTE — Patient Instructions (Signed)
WHAT OB PATIENTS CAN EXPECT   Confirmation of pregnancy and ultrasound ordered if medically indicated-[redacted] weeks gestation  New OB (NOB) intake with nurse and New OB (NOB) labs- [redacted] weeks gestation  New OB (NOB) physical examination with provider- 11/[redacted] weeks gestation  Flu vaccine-[redacted] weeks gestation  Anatomy scan-[redacted] weeks gestation  Glucose tolerance test, blood work to test for anemia, T-dap vaccine-[redacted] weeks gestation  Vaginal swabs/cultures-STD/Group B strep-[redacted] weeks gestation  Appointments every 4 weeks until 28 weeks  Every 2 weeks from 28 weeks until 36 weeks  Weekly visits from 41 weeks until delivery  Pregnancy and COVID-19 Pregnant women and women who were recently pregnant are at an increased risk for severe illness from COVID-19. Other conditions, such as being pregnant at an older age or having diabetes or obesity, can further increase the risk of severe illness from COVID-19. This risk can last for at least 42 days following the end of the pregnancy. Protect yourself and your baby by:  Knowing your risk factors. Ask your health care provider about your specific risk factors.  Working with your health care team to protect yourself against all infections, including COVID-19. How does COVID-19 affect me? If you get COVID-19 while pregnant or shortly after your pregnancy, there is an increased risk that you may:  Get a respiratory illness that can lead to pneumonia or severe illness.  Give birth to your baby before 37 weeks of pregnancy (preterm birth).  Have other complications that can affect your pregnancy. How does COVID-19 affect my care? If you have COVID-19, special precautions will be taken around your pregnancy:  You will have to notify the clinic or hospital before a visit. Steps will be taken to protect other people from the virus, including seeing you in a special room.  Tests and scans may be done differently before delivery (prenatal care).  Your birth  plan may change, including what room you will be in and who may be with you during labor and delivery.  You may stay longer in the hospital after delivery (postpartum care).  COVID-19 will affect where your baby will stay after delivery. Ask about the risks and benefits of staying in the same room with your baby. Benefits include breastfeeding and mother-newborn bonding.  You may have to feed your baby differently.  Visitors will be limited after your baby is born. How does COVID-19 affect my baby? It is very rare for a mother with COVID-19 to pass the virus to the unborn baby. After birth, a baby can get the virus if he or she is exposed to it. Ask your health care provider about ways to protect your baby. The baby can be placed in an incubator. A physical barrier can also be used. What can I do to lower my risk? Medicines and vaccines  You can receive a COVID-19 vaccination. This can protect you from severe illness. If you have concerns, talk to your health care provider.  Get other recommended vaccines, including the flu vaccine and the whooping cough (Tdap) vaccine.  Ask your health care provider if you can get a 30-day, or longer, supply of your medicines, so you can make fewer trips to the pharmacy.  If you have received a COVID-19 vaccine, consider enrolling in the v-safe program from the CDC. This program uses an app on your smartphone to provide check-ins and gather information on your health after you receive the vaccine. There is a separate registry for pregnant women. For more information, visit: ?  V-safe tool: http://boyd.org/ ? V-safe pregnancy registry: SuperiorMarketers.be Cleaning and personal hygiene If you are in isolation for COVID-19 and are sharing a room with your newborn, take these steps to reduce the risk of spreading the virus to your newborn:  Wash your hands with soap and water for at least 20 seconds before holding or caring for your baby.  If soap and water are not available, use alcohol-based hand sanitizer.  Wear a mask when within 6 feet (1.8 m) of your baby.  Keep your baby more than 6 feet (1.8 m) away from you as much as possible.  Avoid touching your mouth, face, eyes, or nose before washing your hands.  Clean and disinfect objects and surfaces that are frequently touched.   Other things to do  Avoid people who might have been exposed to or infected with COVID-19, including people who live with you.  Cover your mouth and nose by wearing a mask or other cloth covering over your face when you go out in public.  Avoid people who are not wearing a mask.  Avoid large crowds. Maintain at least 6 feet (1.8 m) between yourself and others.  Avoid poorly ventilated spaces.  Call your health care provider if you have any health concerns. ? Contact your health care provider right away if you think you have COVID-19. Tell your health care provider that you think you may have a COVID-19 infection and that you are pregnant. Breastfeeding tips Plan with your family and health care team how to feed your baby. Current research shows that the virus may not pass to a baby through breast milk. If you are breastfeeding, you can receive a COVID-19 vaccine. The vaccines pose no risk for breastfeeding mothers or their babies.  Some of the vaccines might create antibodies in breast milk. These antibodies can help to protect your baby. Take precautions if you have or may have COVID-19. Precautions include:  Washing hands with soap and water for at least 20 seconds before feeding your baby. If soap and water are not available, use alcohol-based hand sanitizer.  Wearing a mask while feeding your baby.  Pumping or expressing breast milk to feed to your baby. If possible, ask someone in your household who is not sick to feed your baby the expressed breast milk. ? Wash your hands with soap and water for at least 20 seconds before touching  pump parts. ? Wash and disinfect all pump parts after expressing milk. Follow the manufacturer's instructions to clean and disinfect all pump parts. Follow these instructions: Managing stress Some pregnant and postpartum women may have fear, uncertainty, and stress because of COVID-19. Find ways to manage stress. These may include:  Using relaxation techniques such as meditation and deep breathing.  Getting regular exercise. Most women can continue their usual exercise routine during pregnancy. Ask your health care provider what activities are safe for you.  Seeking support from family, friends, or spiritual resources. If you cannot be together in person, you can still connect by phone calls, texts, video calls, or online messaging.  Doing relaxing activities that you enjoy, such as listening to music or reading a good book. General instructions  Follow your health care provider's instructions on taking medicines. Some medicines may not be safe to take during pregnancy.  Ask for help if you have counseling or nutritional needs. Your health care provider can offer advice or refer you to resources or specialists who can help you with various needs.  Keep all follow-up  visits. This is important. This includes visits before and after you have your baby. Questions to ask your health care team  What should I do if I have COVID-19 symptoms?  What are the side effects that can occur after receiving any of the available COVID-19 vaccines?  How will COVID-19 affect my prenatal care visits, tests and scans, labor and delivery, and postpartum care?  What are the risks of COVID-19 to me and the potential risks to my unborn baby or infant?  How do vaccines pass antibodies to my unborn baby?  Should I plan to breastfeed my baby?  Where can I find mental health resources?  Where can I find support if I have financial concerns? Where to find more information  CDC:  PopCorners.nl  World Health Organization [facility]): HandicappingSports.nl  [employer] of Obstetricians and Gynecologists (ACOG): www.acog.org Contact a health care provider if:  You have signs and symptoms of infection, including a fever or cough. ? Tell your health care team that you think you may have a COVID-19 infection and that you are pregnant.  You have strong emotions, such as sadness or anxiety.  You feel unsafe in your home and need help finding a safe place to live.  You have bloody or watery vaginal discharge or vaginal bleeding. Get help right away if:  You have signs or symptoms of labor before 37 weeks of pregnancy. These include: ? Contractions that are 5 minutes or less apart, or that increase in frequency, intensity, or length. ? Sudden, sharp pain in the abdomen or in the lower back. ? A gush or trickle of fluid from your vagina.  You have signs of more serious illness, such as: ? Trouble breathing. ? Chest pain. ? A fever of 102.40F (39C) or higher that does not go away. ? Vomiting every time you drink fluids. ? Feeling extremely weak. ? Fainting. These symptoms may represent a serious problem that is an emergency. Do not wait to see if the symptoms will go away. Get medical help right away. Call your local emergency services (911 in the [place] Do not drive yourself to the hospital. Summary  Pregnant women and women who were recently pregnant are at an increased risk for severe illness from COVID-19.  Take precautions to protect yourself and your baby. Wear a mask. Wash hands often. Avoid touching your mouth, face, eyes, or nose before washing hands. Avoid large groups of people and stay away from people who are sick.  If you think you have a COVID-19 infection, contact your health care provider right away. Tell your health care provider that you think you have COVID-19 and that you are pregnant.  If you have COVID-19,  special precautions may be taken during pregnancy, labor and delivery, and after delivery. This information is not intended to replace advice given to you by your health care provider. Make sure you discuss any questions you have with your health care provider. Document Revised: 04/30/2020 Document Reviewed: 04/30/2020 Elsevier Patient Education  2021 [place]. How a Baby Grows During Pregnancy Pregnancy begins when a female's sperm enters a female's egg. This is called fertilization. Fertilization usually happens in one of the fallopian tubes that connect the ovaries to the uterus. The fertilized egg moves down the fallopian tube to the uterus. Once it reaches the uterus, it implants into the lining of the uterus and begins to grow. For the first 8 weeks, the fertilized egg is called an embryo. After 8 weeks, it is called  a fetus. As the fetus continues to grow, it receives oxygen and nutrients through the placenta, which is an organ that grows to support the developing baby. The placenta is the life support system for the baby. It provides oxygen and nutrition and removes waste. How long does a typical pregnancy last? A pregnancy usually lasts 280 days, or about 40 weeks. Pregnancy is divided into three periods of growth, also called trimesters:  First trimester: 0-12 weeks.  Second trimester: 13-27 weeks.  Third trimester: 28-40 weeks. The day when your baby is ready to be born (full term) is your estimated date of delivery. However, most babies are not born on their estimated date of delivery. How does my baby develop month by month? First month  The fertilized egg attaches to the inside of the uterus.  Some cells will form the placenta. Others will form the fetus.  The arms, legs, brain, spinal cord, lungs, and heart begin to develop.  At the end of the first month, the heart begins to beat. Second month  The bones, inner ear, eyelids, hands, and feet form.  The genitals  develop.  By the end of 8 weeks, all major organs are developing. Third month  All of the internal organs are forming.  Teeth develop below the gums.  Bones and muscles begin to grow. The spine can flex.  The skin is transparent.  Fingernails and toenails begin to form.  Arms and legs continue to grow longer, and hands and feet develop.  The fetus is about 3 inches (7.6 cm) long. Fourth month  The placenta is completely formed.  The external sex organs, neck, outer ear, eyebrows, eyelids, and fingernails are formed.  The fetus can hear, swallow, and move its arms and legs.  The kidneys begin to produce urine.  The skin is covered with a white, waxy coating (vernix) and very fine hair (lanugo). Fifth month  The fetus moves around more and can be felt for the first time (quickening).  The fetus starts to sleep and wake up and may begin to suck a finger.  The nails grow to the end of the fingers.  The organ in the digestive system that makes bile (gallbladder) functions and helps to digest nutrients.  If the fetus is a female, eggs are present in the ovaries. If the fetus is a female, testicles start to move down into the scrotum. Sixth month  The lungs are formed.  The eyes open. The brain continues to develop.  Your baby has fingerprints and toe prints. Your baby's hair grows thicker.  At the end of the second trimester, the fetus is about 9 inches (22.9 cm) long. Seventh month  The fetus kicks and stretches.  The eyes are developed enough to sense changes in light.  The hands can make a grasping motion.  The fetus responds to sound. Eighth month  Most organs and body systems are fully developed and functioning.  Bones harden, and taste buds develop. The fetus may hiccup.  Certain areas of the brain are still developing. The skull remains soft. Ninth month  The fetus gains about  lb (0.23 kg) each week.  The lungs are fully developed.  Patterns of  sleep develop.  The fetus's head typically moves into a head-down position (vertex) in the uterus to prepare for birth.  The fetus weighs 6-9 lb (2.72-4.08 kg) and is 19-20 inches (48.26-50.8 cm) long.   How do I know if my baby is developing well? Always talk  with your health care provider about any concerns that you may have about your pregnancy and your baby. At each prenatal visit, your health care provider will do several different tests to check on your health and keep track of your baby's development. These include:  Fundal height and position. To do this, your health care provider will: ? Measure your growing belly from your pubic bone to the top of the uterus using a tape measure. ? Feel your belly to determine your baby's position.  Heartbeat. An ultrasound in the first trimester can confirm pregnancy and show a heartbeat, depending on how far along you are. Your health care provider will check your baby's heart rate at every prenatal visit. You will also have a second trimester ultrasound to check your baby's development. Follow these instructions at home:  Take prenatal vitamins as told by your health care provider. These include vitamins such as folic acid, iron, calcium, and vitamin D. They are important for healthy development.  Take over-the-counter and prescription medicines only as told by your health care provider.  Keep all follow-up visits. This is important. Follow-up visits include prenatal care and screening tests. Summary  A pregnancy usually lasts 280 days, or about 40 weeks. Pregnancy is divided into three periods of growth, also called trimesters.  Your health care provider will monitor your baby's growth and development throughout your pregnancy.  Follow your health care provider's recommendations about taking prenatal vitamins and medicines during your pregnancy.  Talk with your health care provider if you have any concerns about your pregnancy or your  developing baby. This information is not intended to replace advice given to you by your health care provider. Make sure you discuss any questions you have with your health care provider. Document Revised: 11/23/2019 Document Reviewed: 09/29/2019 Elsevier Patient Education  [place]. Obstetrics: Normal and Problem Pregnancies (7th ed., pp. 102-121). [place], [place]: Elsevier."> Textbook of Family Medicine (9th ed., pp. [phone]). [place], [place]: Elsevier [employer].">  First Trimester of Pregnancy  The first trimester of pregnancy starts on the first day of your last menstrual period until the end of week 12. This is months 1 through 3 of pregnancy. A week after a sperm fertilizes an egg, the egg will implant into the wall of the uterus and begin to develop into a baby. By the end of 12 weeks, all the baby's organs will be formed and the baby will be 2-3 inches in size. Body changes during your first trimester Your body goes through many changes during pregnancy. The changes vary and generally return to normal after your baby is born. Physical changes  You may gain or lose weight.  Your breasts may begin to grow larger and become tender. The tissue that surrounds your nipples (areola) may become darker.  Dark spots or blotches (chloasma or mask of pregnancy) may develop on your face.  You may have changes in your hair. These can include thickening or thinning of your hair or changes in texture. Health changes  You may feel nauseous, and you may vomit.  You may have heartburn.  You may develop headaches.  You may develop constipation.  Your gums may bleed and may be sensitive to brushing and flossing. Other changes  You may tire easily.  You may urinate more often.  Your menstrual periods will stop.  You may have a loss of appetite.  You may develop cravings for certain kinds of food.  You may have changes in your emotions from day  to day.  You may have more vivid  and strange dreams. Follow these instructions at home: Medicines  Follow your health care provider's instructions regarding medicine use. Specific medicines may be either safe or unsafe to take during pregnancy. Do not take any medicines unless told to by your health care provider.  Take a prenatal vitamin that contains at least 600 micrograms (mcg) of folic acid. Eating and drinking  Eat a healthy diet that includes fresh fruits and vegetables, whole grains, good sources of protein such as meat, eggs, or tofu, and low-fat dairy products.  Avoid raw meat and unpasteurized juice, milk, and cheese. These carry germs that can harm you and your baby.  If you feel nauseous or you vomit: ? Eat 4 or 5 small meals a day instead of 3 large meals. ? Try eating a few soda crackers. ? Drink liquids between meals instead of during meals.  You may need to take these actions to prevent or treat constipation: ? Drink enough fluid to keep your urine pale yellow. ? Eat foods that are high in fiber, such as beans, whole grains, and fresh fruits and vegetables. ? Limit foods that are high in fat and processed sugars, such as fried or sweet foods. Activity  Exercise only as directed by your health care provider. Most people can continue their usual exercise routine during pregnancy. Try to exercise for 30 minutes at least 5 days a week.  Stop exercising if you develop pain or cramping in the lower abdomen or lower back.  Avoid exercising if it is very hot or humid or if you are at high altitude.  Avoid heavy lifting.  If you choose to, you may have sex unless your health care provider tells you not to. Relieving pain and discomfort  Wear a good support bra to relieve breast tenderness.  Rest with your legs elevated if you have leg cramps or low back pain.  If you develop bulging veins (varicose veins) in your legs: ? Wear support hose as told by your health care provider. ? Elevate your feet for  15 minutes, 3-4 times a day. ? Limit salt in your diet. Safety  Wear your seat belt at all times when driving or riding in a car.  Talk with your health care provider if someone is verbally or physically abusive to you.  Talk with your health care provider if you are feeling sad or have thoughts of hurting yourself. Lifestyle  Do not use hot tubs, steam rooms, or saunas.  Do not douche. Do not use tampons or scented sanitary pads.  Do not use herbal remedies, alcohol, illegal drugs, or medicines that are not approved by your health care provider. Chemicals in these products can harm your baby.  Do not use any products that contain nicotine or tobacco, such as cigarettes, e-cigarettes, and chewing tobacco. If you need help quitting, ask your health care provider.  Avoid cat litter boxes and soil used by cats. These carry germs that can cause birth defects in the baby and possibly loss of the unborn baby (fetus) by miscarriage or stillbirth. General instructions  During routine prenatal visits in the first trimester, your health care provider will do a physical exam, perform necessary tests, and ask you how things are going. Keep all follow-up visits. This is important.  Ask for help if you have counseling or nutritional needs during pregnancy. Your health care provider can offer advice or refer you to specialists for help with various needs.  Schedule a dentist appointment. At home, brush your teeth with a soft toothbrush. Floss gently.  Write down your questions. Take them to your prenatal visits. Where to find more information  American Pregnancy Association: americanpregnancy.[place] and Gynecologists: PoolDevices.com.pt  Office on [employer]: KeywordPortfolios.com.br Contact a health care provider if you have:  Dizziness.  A fever.  Mild pelvic cramps, pelvic pressure, or nagging pain in the abdominal  area.  Nausea, vomiting, or diarrhea that lasts for 24 hours or longer.  A bad-smelling vaginal discharge.  Pain when you urinate.  Known exposure to a contagious illness, such as chickenpox, measles, Zika virus, HIV, or hepatitis. Get help right away if you have:  Spotting or bleeding from your vagina.  Severe abdominal cramping or pain.  Shortness of breath or chest pain.  Any kind of trauma, such as from a fall or a car crash.  New or increased pain, swelling, or redness in an arm or leg. Summary  The first trimester of pregnancy starts on the first day of your last menstrual period until the end of week 12 (months 1 through 3).  Eating 4 or 5 small meals a day rather than 3 large meals may help to relieve nausea and vomiting.  Do not use any products that contain nicotine or tobacco, such as cigarettes, e-cigarettes, and chewing tobacco. If you need help quitting, ask your health care provider.  Keep all follow-up visits. This is important. This information is not intended to replace advice given to you by your health care provider. Make sure you discuss any questions you have with your health care provider. Document Revised: 11/23/2019 Document Reviewed: 09/29/2019 Elsevier Patient Education  2021 [place]. Commonly Asked Questions During Pregnancy  Cats: A parasite can be excreted in cat feces.  To avoid exposure you need to have another person empty the little box.  If you must empty the litter box you will need to wear gloves.  Wash your hands after handling your cat.  This parasite can also be found in raw or undercooked meat so this should also be avoided.  Colds, Sore Throats, Flu: Please check your medication sheet to see what you can take for symptoms.  If your symptoms are unrelieved by these medications please call the office.  Dental Work: Most any dental work Investment banker[employer] recommends is permitted.  X-rays should only be taken during the first trimester if  absolutely necessary.  Your abdomen should be shielded with a lead apron during all x-rays.  Please notify your provider prior to receiving any x-rays.  Novocaine is fine; gas is not recommended.  If your dentist requires a note from [place] prior to dental work please call the office and we will provide one for you.  Exercise: Exercise is an important part of staying healthy during your pregnancy.  You may continue most exercises you were accustomed to prior to pregnancy.  Later in your pregnancy you will most likely notice you have difficulty with activities requiring balance like riding a bicycle.  It is important that you listen to your body and avoid activities that put you at a higher risk of falling.  Adequate rest and staying well hydrated are a must!  If you have questions about the safety of specific activities ask your provider.    Exposure to Children with illness: Try to avoid obvious exposure; report any symptoms to [place] when noted,  If you have chicken pos, red measles or mumps, you  should be immune to these diseases.   Please do not take any vaccines while pregnant unless you have checked with your OB provider.  Fetal Movement: After 28 weeks we recommend you do "kick counts" twice daily.  Lie or sit down in a calm quiet environment and count your baby movements "kicks".  You should feel your baby at least 10 times per hour.  If you have not felt 10 kicks within the first hour get up, walk around and have something sweet to eat or drink then repeat for an additional hour.  If count remains less than 10 per hour notify your provider.  Fumigating: Follow your pest control agent's advice as to how long to stay out of your home.  Ventilate the area well before re-entering.  Hemorrhoids:   Most over-the-counter preparations can be used during pregnancy.  Check your medication to see what is safe to use.  It is important to use a stool softener or fiber in your diet and to drink lots of liquids.  If  hemorrhoids seem to be getting worse please call the office.   Hot Tubs:  Hot tubs Jacuzzis and saunas are not recommended while pregnant.  These increase your internal body temperature and should be avoided.  Intercourse:  Sexual intercourse is safe during pregnancy as long as you are comfortable, unless otherwise advised by your provider.  Spotting may occur after intercourse; report any bright red bleeding that is heavier than spotting.  Labor:  If you know that you are in labor, please go to the hospital.  If you are unsure, please call the office and let us help you decide what to do.  Lifting, straining, etc:  If your job requires heavy lifting or straining please check with your provider for any limitations.  Generally, you should not lift items heavier than that you can lift simply with your hands and arms (no back muscles)  Painting:  Paint fumes do not harm your pregnancy, but may make you ill and should be avoided if possible.  Latex or water based paints have less odor than oils.  Use adequate ventilation while painting.  Permanents & Hair Color:  Chemicals in hair dyes are not recommended as they cause increase hair dryness which can increase hair loss during pregnancy.  " Highlighting" and permanents are allowed.  Dye may be absorbed differently and permanents may not hold as well during pregnancy.  Sunbathing:  Use a sunscreen, as skin burns easily during pregnancy.  Drink plenty of fluids; avoid over heating.  Tanning Beds:  Because their possible side effects are still unknown, tanning beds are not recommended.  Ultrasound Scans:  Routine ultrasounds are performed at approximately 20 weeks.  You will be able to see your baby's general anatomy an if you would like to know the gender this can usually be determined as well.  If it is questionable when you conceived you may also receive an ultrasound early in your pregnancy for dating purposes.  Otherwise ultrasound exams are not  routinely performed unless there is a medical necessity.  Although you can request a scan we ask that you pay for it when conducted because insurance does not cover " patient request" scans.  Work: If your pregnancy proceeds without complications you may work until your due date, unless your physician or employer advises otherwise.  Round Ligament Pain/Pelvic Discomfort:  Sharp, shooting pains not associated with bleeding are fairly common, usually occurring in the second trimester of pregnancy.  They  tend to be worse when standing up or when you remain standing for long periods of time.  These are the result of pressure of certain pelvic ligaments called "round ligaments".  Rest, Tylenol and heat seem to be the most effective relief.  As the womb and fetus grow, they rise out of the pelvis and the discomfort improves.  Please notify the office if your pain seems different than that described.  It may represent a more serious condition.  Common Medications Safe in Pregnancy  Acne:      Constipation:  Benzoyl Peroxide     Colace  Clindamycin      Dulcolax Suppository  Topica Erythromycin     Fibercon  Salicylic Acid      Metamucil         Miralax AVOID:        Senakot   Accutane    Cough:  Retin-A       Cough Drops  Tetracycline      Phenergan w/ Codeine if Rx  Minocycline      Robitussin (Plain & DM)  Antibiotics:     Crabs/Lice:  Ceclor       RID  Cephalosporins    AVOID:  E-Mycins      Kwell  Keflex  Macrobid/Macrodantin   Diarrhea:  Penicillin      Kao-Pectate  Zithromax      Imodium AD         PUSH FLUIDS AVOID:       Cipro     Fever:  Tetracycline      Tylenol (Regular or Extra  Minocycline       Strength)  Levaquin      Extra Strength-Do not          Exceed 8 tabs/24 hrs Caffeine:        '200mg'$ /day (equiv. To 1 cup of coffee or  approx. 3 12 oz  sodas)         Gas: Cold/Hayfever:       Gas-X  Benadryl      Mylicon  Claritin       Phazyme  **Claritin-D        Chlor-Trimeton    Headaches:  Dimetapp      ASA-Free Excedrin  Drixoral-Non-Drowsy     Cold Compress  Mucinex (Guaifenasin)     Tylenol (Regular or Extra  Sudafed/Sudafed-12 Hour     Strength)  **Sudafed PE Pseudoephedrine   Tylenol Cold & Sinus     Vicks Vapor Rub  Zyrtec  **AVOID if Problems With Blood Pressure         Heartburn: Avoid lying down for at least 1 hour after meals  Aciphex      Maalox     Rash:  Milk of Magnesia     Benadryl    Mylanta       1% Hydrocortisone Cream  Pepcid  Pepcid Complete   Sleep Aids:  Prevacid      Ambien   Prilosec       Benadryl  Rolaids       Chamomile Tea  Tums (Limit 4/day)     Unisom         Tylenol PM         Warm milk-add vanilla or  Hemorrhoids:       Sugar for taste  Anusol/Anusol H.C.  (RX: Analapram 2.5%)  Sugar Substitutes:  Hydrocortisone OTC     Ok in moderation  Preparation H  Tucks        Vaseline lotion applied to tissue with wiping    Herpes:     Throat:  Acyclovir      Oragel  Famvir  Valtrex     Vaccines:         Flu Shot Leg Cramps:       *Gardasil  Benadryl      Hepatitis A         Hepatitis B Nasal Spray:       Pneumovax  Saline Nasal Spray     Polio Booster         Tetanus Nausea:       Tuberculosis test or PPD  Vitamin B6 25 mg TID   AVOID:    Dramamine      *Gardasil  Emetrol       Live Poliovirus  Ginger Root 250 mg QID    MMR (measles, mumps &  High Complex Carbs @ Bedtime    rebella)  Sea Bands-Accupressure    Varicella (Chickenpox)  Unisom 1/2 tab TID     *No known complications           If received before Pain:         Known pregnancy;   Darvocet       Resume series after  Lortab        Delivery  Percocet    Yeast:   Tramadol      Femstat  Tylenol 3      Gyne-lotrimin  Ultram       Monistat  Vicodin           MISC:         All Sunscreens           Hair  Coloring/highlights          Insect Repellant's          (Including DEET)         Mystic Tans

## 2020-08-24 LAB — MICROSCOPIC EXAMINATION: Epithelial Cells (non renal): >10 /hpf — AB (ref 0–10)

## 2020-08-24 LAB — URINALYSIS, ROUTINE W REFLEX MICROSCOPIC
Bilirubin, UA: NEGATIVE
Glucose, UA: NEGATIVE
Nitrite, UA: NEGATIVE
RBC, UA: NEGATIVE
Specific Gravity, UA: 1.026 (ref 1.005–1.030)
Urobilinogen, Ur: 1 mg/dL (ref 0.2–1.0)
pH, UA: 6 (ref 5.0–7.5)

## 2020-08-24 LAB — VARICELLA ZOSTER ANTIBODY, IGG: Varicella zoster IgG: 498 index (ref >165)

## 2020-08-24 LAB — HIV ANTIBODY (ROUTINE TESTING W REFLEX): HIV Screen 4th Generation wRfx: NONREACTIVE

## 2020-08-24 LAB — ANTIBODY SCREEN: Antibody Screen: NEGATIVE

## 2020-08-24 LAB — VIRAL HEPATITIS HBV, HCV
HCV Ab: <0.1 s/co ratio (ref 0.0–0.9)
Hep B Core Total Ab: NEGATIVE
Hep B Surface Ab, Qual: REACTIVE
Hepatitis B Surface Ag: NEGATIVE

## 2020-08-24 LAB — RPR: RPR Ser Ql: NONREACTIVE

## 2020-08-24 LAB — ABO AND RH: Rh Factor: POSITIVE

## 2020-08-24 LAB — HCV INTERPRETATION

## 2020-08-25 LAB — CULTURE, OB URINE

## 2020-08-25 LAB — URINE CULTURE, OB REFLEX: Organism ID, Bacteria: NO GROWTH

## 2020-08-26 LAB — GC/CHLAMYDIA PROBE AMP
Chlamydia trachomatis, NAA: NEGATIVE
Neisseria Gonorrhoeae by PCR: NEGATIVE

## 2020-08-27 ENCOUNTER — Ambulatory Visit: Payer: No Typology Code available for payment source | Admitting: Cardiovascular Disease

## 2020-09-04 ENCOUNTER — Encounter: Payer: Self-pay | Admitting: Certified Nurse Midwife

## 2020-09-04 ENCOUNTER — Other Ambulatory Visit: Payer: Self-pay | Admitting: Certified Nurse Midwife

## 2020-09-04 ENCOUNTER — Ambulatory Visit (INDEPENDENT_AMBULATORY_CARE_PROVIDER_SITE_OTHER): Payer: No Typology Code available for payment source | Admitting: Certified Nurse Midwife

## 2020-09-04 ENCOUNTER — Other Ambulatory Visit: Payer: Self-pay

## 2020-09-04 VITALS — BP 122/71 | HR 96 | Wt 156.7 lb

## 2020-09-04 DIAGNOSIS — R82998 Other abnormal findings in urine: Secondary | ICD-10-CM | POA: Diagnosis not present

## 2020-09-04 DIAGNOSIS — Z3480 Encounter for supervision of other normal pregnancy, unspecified trimester: Secondary | ICD-10-CM | POA: Diagnosis not present

## 2020-09-04 DIAGNOSIS — Z3A11 11 weeks gestation of pregnancy: Secondary | ICD-10-CM

## 2020-09-04 LAB — POCT URINALYSIS DIPSTICK OB
Bilirubin, UA: NEGATIVE
Glucose, UA: NEGATIVE
Ketones, UA: NEGATIVE
Nitrite, UA: NEGATIVE
Odor: NEGATIVE
Spec Grav, UA: 1.02 (ref 1.010–1.025)
Urobilinogen, UA: 0.2 E.U./dL
pH, UA: 6.5 (ref 5.0–8.0)

## 2020-09-04 MED ORDER — ONDANSETRON 4 MG PO TBDP: 4.0000 mg | ORAL_TABLET | Freq: Three times a day (TID) | ORAL | 0 refills | Status: DC | PRN

## 2020-09-04 NOTE — Progress Notes (Signed)
NOB- c/o of no appetite. + for nausea- diclegis not helping. Desires panorama today.

## 2020-09-04 NOTE — Patient Instructions (Signed)
https://www.[facility].org/womens-health/faqs/prenatal-genetic-screening-tests">  Prenatal Care Prenatal care is health care during pregnancy. It helps you and your unborn baby (fetus) stay as healthy as possible. Prenatal care may be provided by a midwife, a family practice doctor, a IT consultant (nurse practitioner or physician assistant), or a childbirth and pregnancy doctor (obstetrician). How does this affect me? During pregnancy, you will be closely monitored for any new conditions that might develop. To lower your risk of pregnancy complications, you and your health care provider will talk about any underlying conditions you have. How does this affect my baby? Early and consistent prenatal care increases the chance that your baby will be healthy during pregnancy. Prenatal care lowers the risk that your baby will be:  Born early (prematurely).  Smaller than expected at birth (small for gestational age). What can I expect at the first prenatal care visit? Your first prenatal care visit will likely be the longest. You should schedule your first prenatal care visit as soon as you know that you are pregnant. Your first visit is a good time to talk about any questions or concerns you have about pregnancy. Medical history At your visit, you and your health care provider will talk about your medical history, including:  Any past pregnancies.  Your family's medical history.  Medical history of the baby's father.  Any long-term (chronic) health conditions you have and how you manage them.  Any surgeries or procedures you have had.  Any current over-the-counter or prescription medicines, herbs, or supplements that you are taking.  Other factors that could pose a risk to your baby, including: ? Exposure to harmful chemicals or radiation at work or at home. ? Any substance use, including tobacco, alcohol, and drug use.  Your home setting and your stress levels, including: ? Exposure to  abuse or violence. ? Household financial strain.  Your daily health habits, including diet and exercise. Tests and screenings Your health care provider will:  Measure your weight, height, and blood pressure.  Do a physical exam, including a pelvic and breast exam.  Perform blood tests and urine tests to check for: ? Urinary tract infection. ? Sexually transmitted infections (STIs). ? Low iron levels in your blood (anemia). ? Blood type and certain proteins on red blood cells (Rh antibodies). ? Infections and immunity to viruses, such as hepatitis B and rubella. ? HIV (human immunodeficiency virus).  Discuss your options for genetic screening. Tips about staying healthy Your health care provider will also give you information about how to keep yourself and your baby healthy, including:  Nutrition and taking vitamins.  Physical activity.  How to manage pregnancy symptoms such as nausea and vomiting (morning sickness).  Infections and substances that may be harmful to your baby and how to avoid them.  Food safety.  Dental care.  Working.  Travel.  Warning signs to watch for and when to call your health care provider. How often will I have prenatal care visits? After your first prenatal care visit, you will have regular visits throughout your pregnancy. The visit schedule is often as follows:  Up to week 28 of pregnancy: once every 4 weeks.  28-36 weeks: once every 2 weeks.  After 36 weeks: every week until delivery. Some women may have visits more or less often depending on any underlying health conditions and the health of the baby. Keep all follow-up and prenatal care visits. This is important. What happens during routine prenatal care visits? Your health care provider will:  Measure your weight  and blood pressure.  Check for fetal heart sounds.  Measure the height of your uterus in your abdomen (fundal height). This may be measured starting around week 20 of  pregnancy.  Check the position of your baby inside your uterus.  Ask questions about your diet, sleeping patterns, and whether you can feel the baby move.  Review warning signs to watch for and signs of labor.  Ask about any pregnancy symptoms you are having and how you are dealing with them. Symptoms may include: ? Headaches. ? Nausea and vomiting. ? Vaginal discharge. ? Swelling. ? Fatigue. ? Constipation. ? Changes in your vision. ? Feeling persistently sad or anxious. ? Any discomfort, including back or pelvic pain. ? Bleeding or spotting. Make a list of questions to ask your health care provider at your routine visits.   What tests might I have during prenatal care visits? You may have blood, urine, and imaging tests throughout your pregnancy, such as:  Urine tests to check for glucose, protein, or signs of infection.  Glucose tests to check for a form of diabetes that can develop during pregnancy (gestational diabetes mellitus). This is usually done around week 24 of pregnancy.  Ultrasounds to check your baby's growth and development, to check for birth defects, and to check your baby's well-being. These can also help to decide when you should deliver your baby.  A test to check for group B strep (GBS) infection. This is usually done around week 36 of pregnancy.  Genetic testing. This may include blood, fluid, or tissue sampling, or imaging tests, such as an ultrasound. Some genetic tests are done during the first trimester and some are done during the second trimester. What else can I expect during prenatal care visits? Your health care provider may recommend getting certain vaccines during pregnancy. These may include:  A yearly flu shot (annual influenza vaccine). This is especially important if you will be pregnant during flu season.  Tdap (tetanus, diphtheria, pertussis) vaccine. Getting this vaccine during pregnancy can protect your baby from whooping cough  (pertussis) after birth. This vaccine may be recommended between weeks 27 and 36 of pregnancy.  A COVID-19 vaccine. Later in your pregnancy, your health care provider may give you information about:  Childbirth and breastfeeding classes.  Choosing a health care provider for your baby.  Umbilical cord banking.  Breastfeeding.  Birth control after your baby is born.  The hospital labor and delivery unit and how to set up a tour.  Registering at the hospital before you go into labor. Where to find more information  Office on Women's Health: LegalWarrants.gl  American Pregnancy Association: americanpregnancy.org  March of Dimes: marchofdimes.org Summary  Prenatal care helps you and your baby stay as healthy as possible during pregnancy.  Your first prenatal care visit will most likely be the longest.  You will have visits and tests throughout your pregnancy to monitor your health and your baby's health.  Bring a list of questions to your visits to ask your health care provider.  Make sure to keep all follow-up and prenatal care visits. This information is not intended to replace advice given to you by your health care provider. Make sure you discuss any questions you have with your health care provider. Document Revised: 03/29/2020 Document Reviewed: 03/29/2020 Elsevier Patient Education  [place].

## 2020-09-04 NOTE — Progress Notes (Signed)
NEW OB HISTORY AND PHYSICAL  SUBJECTIVE:       Janet Thompson is a 30 y.o. G36P2002 female, Patient's last menstrual period was 06/13/2020 (approximate)., Estimated Date of Delivery: 03/20/21, [redacted]w[redacted]d, presents today for establishment of Prenatal Care. She has no unusual complaints and complains of headache and nausea, diclegis not work for her.    Social Relationship: married with 2 boys Lives with family Works: 2 days a week at hospital Exercise: not currently , but will start walking again when she feels better.  Denies alcohol, drug use and smoking    Gynecologic History Patient's last menstrual period was 06/13/2020 (approximate). Normal Contraception: none Last Pap: 06/20/20. Results were: abnormal, Colposcopy 08/16/20  Obstetric History OB History  Gravida Para Term Preterm AB Living  3 2 2  0 0 2  SAB IAB Ectopic Multiple Live Births  0 0 0 0 2    # Outcome Date GA Lbr Len/2nd Weight Sex Delivery Anes PTL Lv  3 Current           2 Term 11/04/18 [redacted]w[redacted]d / 00:31 8 lb 9 oz (3.884 kg) M Vag-Spont EPI N LIV  1 Term 06/24/15 [redacted]w[redacted]d 10:50 / 01:39 8 lb 4.6 oz (3.76 kg) M Vag-Spont EPI  LIV    Past Medical History:  Diagnosis Date  . Frequent headaches   . History of fainting spells of unknown cause   . Menstrual irregularity   . Migraines   . Placenta previa antepartum in second trimester 05/07/2018  . 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 WISDOM TOOTH EXTRACTION      Current Outpatient Medications on File Prior to Visit  Medication Sig Dispense Refill  . butalbital-acetaminophen-caffeine (FIORICET) 50-325-40 MG tablet Take 1-2 tablets by mouth daily as needed for migraine. 30 tablet 0  . Doxylamine-Pyridoxine (DICLEGIS) 10-10 MG TBEC Take 2 tablets by mouth at bedtime. If symptoms persist, add one tablet in the morning and one in the afternoon 100 tablet 5  . Prenat-Methylfol-Chol-Fish Oil (PRENATAL + COMPLETE MULTI)  0.267 & 373 MG THPK Take 1 tablet by mouth daily.     No current facility-administered medications on file prior to visit.    No Known Allergies  Social History   Socioeconomic History  . Marital status: Married    Spouse name: Not on file  . Number of children: Not on file  . Years of education: Not on file  . Highest education level: Not on file  Occupational History  . Not on file  Tobacco Use  . Smoking status: Never Smoker  . Smokeless tobacco: Never Used  Vaping Use  . Vaping Use: Never used  Substance and Sexual Activity  . Alcohol use: Not Currently    Comment: occasional  . Drug use: No  . Sexual activity: Yes    Birth control/protection: None  Other Topics Concern  . Not on file  Social History Narrative   Work or School: [employer] Situation: Has 51-year-old son and 2019      Spiritual Beliefs:      Lifestyle: Healthy diet, regular activity   Social Determinants of Health   Financial Resource Strain: Not on file  Food Insecurity: Not on file  Transportation Needs: Not on file  Physical Activity: Not on file  Stress: Not on file  Social Connections: Not on file  Intimate Partner Violence: Not on file  Family History  Problem Relation Age of Onset  . Colon cancer Maternal Aunt 6  . Alcohol abuse Maternal Grandfather        colon  . Diabetes Paternal Grandmother   . Healthy Mother        heavy menses; early hysterectomy  . High blood pressure Father     The following portions of the patient's history were reviewed and updated as appropriate: allergies, current medications, past OB history, past medical history, past surgical history, past family history, past social history, and problem list.    OBJECTIVE: Initial Physical Exam (New OB)  GENERAL APPEARANCE: alert, well appearing, in no apparent distress, oriented to person, place and time HEAD: normocephalic, atraumatic MOUTH: mucous membranes moist, pharynx  normal without lesions THYROID: no thyromegaly or masses present BREASTS: no masses noted, no significant tenderness, no palpable axillary nodes, no skin changes LUNGS: clear to auscultation, no wheezes, rales or rhonchi, symmetric air entry HEART: regular rate and rhythm, no murmurs ABDOMEN: soft, nontender, nondistended, no abnormal masses, no epigastric pain and FHT present EXTREMITIES: no redness or tenderness in the calves or thighs, no edema, no limitation in range of motion, intact peripheral pulses SKIN: normal coloration and turgor, no rashes LYMPH NODES: no adenopathy palpable NEUROLOGIC: alert, oriented, normal speech, no focal findings or movement disorder noted  PELVIC EXAM deferred, not due for pap , has tested pelvis  ASSESSMENT: Normal pregnancy  PLAN: New OB counseling: The patient has been given an overview regarding routine prenatal care. Recommendations regarding diet, weight gain, and exercise in pregnancy were given. Prenatal testing, optional genetic testing, carrier screening, and ultrasound use in pregnancy were reviewed. Panorama completed today . Benefits of Breast Feeding were discussed. The patient is encouraged to consider nursing her baby post partum. Zofran ordered for  Nausea. She has stopped the Effexor and has only taken fiorocet on occasion ( 2 times) for migraine. Follow up 4 wks with Sharyn Lull.    Philip Aspen, CNM

## 2020-09-05 LAB — CBC WITH DIFFERENTIAL/PLATELET
Basophils Absolute: 0.1 10*3/uL (ref 0.0–0.2)
Basos: 1 %
EOS (ABSOLUTE): 0.1 10*3/uL (ref 0.0–0.4)
Eos: 2 %
Hematocrit: 37.9 % (ref 34.0–46.6)
Hemoglobin: 12.7 g/dL (ref 11.1–15.9)
Immature Grans (Abs): 0 10*3/uL (ref 0.0–0.1)
Immature Granulocytes: 0 %
Lymphocytes Absolute: 1.6 10*3/uL (ref 0.7–3.1)
Lymphs: 22 %
MCH: 29.9 pg (ref 26.6–33.0)
MCHC: 33.5 g/dL (ref 31.5–35.7)
MCV: 89 fL (ref 79–97)
Monocytes Absolute: 0.4 10*3/uL (ref 0.1–0.9)
Monocytes: 6 %
Neutrophils Absolute: 5 10*3/uL (ref 1.4–7.0)
Neutrophils: 69 %
Platelets: 236 10*3/uL (ref 150–450)
RBC: 4.25 x10E6/uL (ref 3.77–5.28)
RDW: 12.6 % (ref 11.7–15.4)
WBC: 7.2 10*3/uL (ref 3.4–10.8)

## 2020-09-06 LAB — MONITOR DRUG PROFILE 14(MW)
Amphetamine Scrn, Ur: NEGATIVE ng/mL
BARBITURATE SCREEN URINE: NEGATIVE ng/mL
BENZODIAZEPINE SCREEN, URINE: NEGATIVE ng/mL
Buprenorphine, Urine: NEGATIVE ng/mL
Cocaine (Metab) Scrn, Ur: NEGATIVE ng/mL
Creatinine(Crt), U: 355.8 mg/dL — ABNORMAL HIGH (ref 20.0–300.0)
Fentanyl, Urine: NEGATIVE pg/mL
Meperidine Screen, Urine: NEGATIVE ng/mL
Methadone Screen, Urine: NEGATIVE ng/mL
OXYCODONE+OXYMORPHONE UR QL SCN: NEGATIVE ng/mL
Opiate Scrn, Ur: NEGATIVE ng/mL
Ph of Urine: 6 (ref 4.5–8.9)
Phencyclidine Qn, Ur: NEGATIVE ng/mL
Propoxyphene Scrn, Ur: NEGATIVE ng/mL
SPECIFIC GRAVITY: 1.024
Tramadol Screen, Urine: NEGATIVE ng/mL

## 2020-09-06 LAB — NICOTINE SCREEN, URINE: Cotinine Ql Scrn, Ur: NEGATIVE ng/mL

## 2020-09-06 LAB — CANNABINOID (GC/MS), URINE: Cannabinoid: NEGATIVE

## 2020-09-07 LAB — URINE CULTURE: Organism ID, Bacteria: NO GROWTH

## 2020-10-05 ENCOUNTER — Ambulatory Visit (INDEPENDENT_AMBULATORY_CARE_PROVIDER_SITE_OTHER): Payer: No Typology Code available for payment source | Admitting: Certified Nurse Midwife

## 2020-10-05 ENCOUNTER — Other Ambulatory Visit: Payer: Self-pay

## 2020-10-05 VITALS — BP 121/80 | HR 99 | Wt 152.4 lb

## 2020-10-05 DIAGNOSIS — Z3482 Encounter for supervision of other normal pregnancy, second trimester: Secondary | ICD-10-CM

## 2020-10-05 DIAGNOSIS — Z3689 Encounter for other specified antenatal screening: Secondary | ICD-10-CM

## 2020-10-05 DIAGNOSIS — Z3A16 16 weeks gestation of pregnancy: Secondary | ICD-10-CM

## 2020-10-05 DIAGNOSIS — Z3402 Encounter for supervision of normal first pregnancy, second trimester: Secondary | ICD-10-CM

## 2020-10-05 LAB — POCT URINALYSIS DIPSTICK OB
Appearance: ABNORMAL
Bilirubin, UA: NEGATIVE
Blood, UA: POSITIVE
Glucose, UA: NEGATIVE
Ketones, UA: NEGATIVE
Leukocytes, UA: NEGATIVE
Nitrite, UA: NEGATIVE
Odor: NORMAL
Spec Grav, UA: 1.025 (ref 1.010–1.025)
Urobilinogen, UA: 0.2 E.U./dL
pH, UA: 6 (ref 5.0–8.0)

## 2020-10-05 NOTE — Patient Instructions (Signed)
Morning Sickness  Morning sickness is when a woman feels nauseous during pregnancy. This nauseous feeling may or may not come with vomiting. It often occurs in the morning, but it can be a problem at any time of day. Morning sickness is most common during the first trimester. In some cases, it may continue throughout pregnancy. Although morning sickness is unpleasant, it is usually harmless unless the woman develops severe and continual vomiting (hyperemesis gravidarum), a condition that requires more intense treatment. What are the causes? The exact cause of this condition is not known, but it seems to be related to normal hormonal changes that occur in pregnancy. What increases the risk? You are more likely to develop this condition if:  You experienced nausea or vomiting before your pregnancy.  You had morning sickness during a previous pregnancy.  You are pregnant with more than one baby, such as twins. What are the signs or symptoms? Symptoms of this condition include:  Nausea.  Vomiting. How is this diagnosed? This condition is usually diagnosed based on your signs and symptoms. How is this treated? In many cases, treatment is not needed for this condition. Making some changes to what you eat may help to control symptoms. Your health care provider may also prescribe or recommend:  Vitamin B6 supplements.  Anti-nausea medicines.  Ginger. Follow these instructions at home: Medicines  Take over-the-counter and prescription medicines only as told by your health care provider. Do not use any prescription, over-the-counter, or herbal medicines for morning sickness without first talking with your health care provider.  Take multivitamins before getting pregnant. This can prevent or decrease the severity of morning sickness in most women. Eating and drinking  Eat a piece of dry toast or crackers before getting out of bed in the morning.  Eat 5 or 6 small meals a day.  Eat dry  and bland foods, such as rice or a baked potato. Foods that are high in carbohydrates are often helpful.  Avoid greasy, fatty, and spicy foods.  Have someone cook for you if the smell of any food causes nausea and vomiting.  If you feel nauseous after taking prenatal vitamins, take the vitamins at night or with a snack.  Eat a protein snack between meals if you are hungry. Nuts, yogurt, and cheese are good options.  Drink fluids throughout the day.  Try ginger ale made with real ginger, ginger tea made from fresh grated ginger, or ginger candies. General instructions  Do not use any products that contain nicotine or tobacco. These products include cigarettes, chewing tobacco, and vaping devices, such as e-cigarettes. If you need help quitting, ask your health care provider.  Get an air purifier to keep the air in your house free of odors.  Get plenty of fresh air.  Try to avoid odors that trigger your nausea.  Consider trying these methods to help relieve symptoms: ? Wearing an acupressure wristband. These wristbands are often worn for seasickness. ? Acupuncture. Contact a health care provider if:  Your home remedies are not working and you need medicine.  You feel dizzy or light-headed.  You are losing weight. Get help right away if:  You have persistent and uncontrolled nausea and vomiting.  You faint.  You have severe pain in your abdomen. Summary  Morning sickness is when a woman feels nauseous during pregnancy. This nauseous feeling may or may not come with vomiting.  Morning sickness is most common during the first trimester.  It often occurs in the  morning, but it can be a problem at any time of day.  In many cases, treatment is not needed for this condition. Making some changes to what you eat may help to control symptoms. This information is not intended to replace advice given to you by your health care provider. Make sure you discuss any questions you have  with your health care provider. Document Revised: 01/30/2020 Document Reviewed: 01/09/2020 Elsevier Patient Education  [place].   Common Medications Safe in Pregnancy  Acne:      Constipation:  Benzoyl Peroxide     Colace  Clindamycin      Dulcolax Suppository  Topica Erythromycin     Fibercon  Salicylic Acid      Metamucil         Miralax AVOID:        Senakot   Accutane    Cough:  Retin-A       Cough Drops  Tetracycline      Phenergan w/ Codeine if Rx  Minocycline      Robitussin (Plain & DM)  Antibiotics:     Crabs/Lice:  Ceclor       RID  Cephalosporins    AVOID:  E-Mycins      Kwell  Keflex  Macrobid/Macrodantin   Diarrhea:  Penicillin      Kao-Pectate  Zithromax      Imodium AD         PUSH FLUIDS AVOID:       Cipro     Fever:  Tetracycline      Tylenol (Regular or Extra  Minocycline       Strength)  Levaquin      Extra Strength-Do not          Exceed 8 tabs/24 hrs Caffeine:        <218m/day (equiv. To 1 cup of coffee or  approx. 3 12 oz sodas)         Gas: Cold/Hayfever:       Gas-X  Benadryl      Mylicon  Claritin       Phazyme  **Claritin-D        Chlor-Trimeton    Headaches:  Dimetapp      ASA-Free Excedrin  Drixoral-Non-Drowsy     Cold Compress  Mucinex (Guaifenasin)     Tylenol (Regular or Extra  Sudafed/Sudafed-12 Hour     Strength)  **Sudafed PE Pseudoephedrine   Tylenol Cold & Sinus     Vicks Vapor Rub  Zyrtec  **AVOID if Problems With Blood Pressure         Heartburn: Avoid lying down for at least 1 hour after meals  Aciphex      Maalox     Rash:  Milk of Magnesia     Benadryl    Mylanta       1% Hydrocortisone Cream  Pepcid  Pepcid Complete   Sleep Aids:  Prevacid      Ambien   Prilosec       Benadryl  Rolaids       Chamomile Tea  Tums (Limit 4/day)     Unisom         Tylenol PM         Warm milk-add vanilla or  Hemorrhoids:       Sugar for taste  Anusol/Anusol H.C.  (RX: Analapram 2.5%)  Sugar  Substitutes:  Hydrocortisone OTC     Ok in moderation  Preparation H      Tucks  Vaseline lotion applied to tissue with wiping    Herpes:     Throat:  Acyclovir      Oragel  Famvir  Valtrex     Vaccines:         Flu Shot Leg Cramps:       *Gardasil  Benadryl      Hepatitis A         Hepatitis B Nasal Spray:       Pneumovax  Saline Nasal Spray     Polio Booster         Tetanus Nausea:       Tuberculosis test or PPD  Vitamin B6 25 mg TID   AVOID:    Dramamine      *Gardasil  Emetrol       Live Poliovirus  Ginger Root 250 mg QID    MMR (measles, mumps &  High Complex Carbs @ Bedtime    rebella)  Sea Bands-Accupressure    Varicella (Chickenpox)  Unisom 1/2 tab TID     *No known complications           If received before Pain:         Known pregnancy;   Darvocet       Resume series after  Lortab        Delivery  Percocet    Yeast:   Tramadol      Femstat  Tylenol 3      Gyne-lotrimin  Ultram       Monistat  Vicodin           MISC:         All Sunscreens           Hair Coloring/highlights          Insect Repellant's          (Including DEET)         Mystic Tans    Second Trimester of Pregnancy  The second trimester of pregnancy is from week 13 through week 27. This is also called months 4 through 6 of pregnancy. This is often the time when you feel your best. During the second trimester:  Morning sickness is less or has stopped.  You may have more energy.  You may feel hungry more often. At this time, your unborn baby (fetus) is growing very fast. At the end of the sixth month, the unborn baby may be up to 12 inches long and weigh about 1 pounds. You will likely start to feel the baby move between 16 and 20 weeks of pregnancy. Body changes during your second trimester Your body continues to go through many changes during this time. The changes vary and generally return to normal after the baby is born. Physical changes  You will gain more weight.  You may  start to get stretch marks on your hips, belly (abdomen), and breasts.  Your breasts will grow and may hurt.  Dark spots or blotches may develop on your face.  A dark line from your belly button to the pubic area (linea nigra) may appear.  You may have changes in your hair. Health changes  You may have headaches.  You may have heartburn.  You may have trouble pooping (constipation).  You may have hemorrhoids or swollen, bulging veins (varicose veins).  Your gums may bleed.  You may pee (urinate) more often.  You may have back pain. Follow these instructions at home: Medicines  Take over-the-counter and prescription medicines only as  told by your doctor. Some medicines are not safe during pregnancy.  Take a prenatal vitamin that contains at least 600 micrograms (mcg) of folic acid. Eating and drinking  Eat healthy meals that include: ? Fresh fruits and vegetables. ? Whole grains. ? Good sources of protein, such as meat, eggs, or tofu. ? Low-fat dairy products.  Avoid raw meat and unpasteurized juice, milk, and cheese.  You may need to take these actions to prevent or treat trouble pooping: ? Drink enough fluids to keep your pee (urine) pale yellow. ? Eat foods that are high in fiber. These include beans, whole grains, and fresh fruits and vegetables. ? Limit foods that are high in fat and sugar. These include fried or sweet foods. Activity  Exercise only as told by your doctor. Most people can do their usual exercise during pregnancy. Try to exercise for 30 minutes at least 5 days a week.  Stop exercising if you have pain or cramps in your belly or lower back.  Do not exercise if it is too hot or too humid, or if you are in a place of great height (high altitude).  Avoid heavy lifting.  If you choose to, you may have sex unless your doctor tells you not to. Relieving pain and discomfort  Wear a good support bra if your breasts are sore.  Take warm water baths  (sitz baths) to soothe pain or discomfort caused by hemorrhoids. Use hemorrhoid cream if your doctor approves.  Rest with your legs raised (elevated) if you have leg cramps or low back pain.  If you develop bulging veins in your legs: ? Wear support hose as told by your doctor. ? Raise your feet for 15 minutes, 3-4 times a day. ? Limit salt in your food. Safety  Wear your seat belt at all times when you are in a car.  Talk with your doctor if someone is hurting you or yelling at you a lot. Lifestyle  Do not use hot tubs, steam rooms, or saunas.  Do not douche. Do not use tampons or scented sanitary pads.  Avoid cat litter boxes and soil used by cats. These carry germs that can harm your baby and can cause a loss of your baby by miscarriage or stillbirth.  Do not use herbal medicines, illegal drugs, or medicines that are not approved by your doctor. Do not drink alcohol.  Do not smoke or use any products that contain nicotine or tobacco. If you need help quitting, ask your doctor. General instructions  Keep all follow-up visits. This is important.  Ask your doctor about local prenatal classes.  Ask your doctor about the right foods to eat or for help finding a counselor. Where to find more information  American Pregnancy Association: americanpregnancy.org  [employer]: www.acog.org  Office on [employer]: KeywordPortfolios.com.br Contact a doctor if:  You have a headache that does not go away when you take medicine.  You have changes in how you see, or you see spots in front of your eyes.  You have mild cramps, pressure, or pain in your lower belly.  You continue to feel like you may vomit (nauseous), you vomit, or you have watery poop (diarrhea).  You have bad-smelling fluid coming from your vagina.  You have pain when you pee or your pee smells bad.  You have very bad swelling of your face, hands, ankles, feet, or  legs.  You have a fever. Get help right away if:  You are leaking fluid from your vagina.  You have spotting or bleeding from your vagina.  You have very bad belly cramping or pain.  You have trouble breathing.  You have chest pain.  You faint.  You have not felt your baby move for the time period told by your doctor.  You have new or increased pain, swelling, or redness in an arm or leg. Summary  The second trimester of pregnancy is from week 13 through week 27 (months 4 through 6).  Eat healthy meals.  Exercise as told by your doctor. Most people can do their usual exercise during pregnancy.  Do not use herbal medicines, illegal drugs, or medicines that are not approved by your doctor. Do not drink alcohol.  Call your doctor if you get sick or if you notice anything unusual about your pregnancy. This information is not intended to replace advice given to you by your health care provider. Make sure you discuss any questions you have with your health care provider. Document Revised: 11/23/2019 Document Reviewed: 09/29/2019 Elsevier Patient Education  2021 [employer].

## 2020-10-05 NOTE — Progress Notes (Signed)
ROB- Doing well overall. Reports mild nausea and vomting x one (1) a day, decreased appetite, and constipation treated with home treatment measures. Encouraged small frequent meals and snacks, increase protein and water intake in diet. Anticipatory guidance about course of prenatal care. Reviewed red flag symptoms and when to call. RTC x 4 weeks for ANATOMY SCAN and ROB with ANNIE or sooner if needed.   Daneil Dan, [place] 10/05/20 10:00 AM

## 2020-10-05 NOTE — Progress Notes (Signed)
I have seen, interviewed, and examined the patient in conjunction with the [employer] [facility] and affirm the diagnosis and management plan.   Diona Fanti, CNM [facility], [facility] 10/05/20 12:42 PM

## 2020-10-05 NOTE — Progress Notes (Signed)
ROB: Feeling well, no concerns.

## 2020-10-31 ENCOUNTER — Ambulatory Visit: Payer: No Typology Code available for payment source

## 2020-11-07 ENCOUNTER — Encounter: Payer: Self-pay | Admitting: Certified Nurse Midwife

## 2020-11-07 ENCOUNTER — Other Ambulatory Visit: Payer: Self-pay

## 2020-11-07 ENCOUNTER — Ambulatory Visit (INDEPENDENT_AMBULATORY_CARE_PROVIDER_SITE_OTHER): Payer: No Typology Code available for payment source | Admitting: Certified Nurse Midwife

## 2020-11-07 VITALS — BP 118/79 | HR 101 | Wt 153.0 lb

## 2020-11-07 DIAGNOSIS — Z3402 Encounter for supervision of normal first pregnancy, second trimester: Secondary | ICD-10-CM

## 2020-11-07 DIAGNOSIS — Z3A21 21 weeks gestation of pregnancy: Secondary | ICD-10-CM

## 2020-11-07 LAB — POCT URINALYSIS DIPSTICK OB
Bilirubin, UA: NEGATIVE
Blood, UA: NEGATIVE
Glucose, UA: NEGATIVE
Ketones, UA: NEGATIVE
Nitrite, UA: NEGATIVE
Spec Grav, UA: 1.02 (ref 1.010–1.025)
Urobilinogen, UA: 0.2 E.U./dL
pH, UA: 6.5 (ref 5.0–8.0)

## 2020-11-07 NOTE — Progress Notes (Signed)
ROB doing well, discussed round ligament pain and self help measures. She is feeling good movement. State she has been very exhausted, had covid a few weeks ago and is still feeling tiered from it. Reassurance given. U/s for anatomy scheduled in June. Will follow up with results. Return in 4 wk with Sharyn Lull for Janet Thompson.   Philip Aspen, CNM

## 2020-11-07 NOTE — Patient Instructions (Signed)
Round Ligament Pain  The round ligament is a cord of muscle and tissue that helps support the uterus. It can become a source of pain during pregnancy if it becomes stretched or twisted as the baby grows. The pain usually begins in the second trimester (13-28 weeks) of pregnancy, and it can come and go until the baby is delivered. It is not a serious problem, and it does not cause harm to the baby. Round ligament pain is usually a short, sharp, and pinching pain, but it can also be a dull, lingering, and aching pain. The pain is felt in the lower side of the abdomen or in the groin. It usually starts deep in the groin and moves up to the outside of the hip area. The pain may occur when you:  Suddenly change position, such as quickly going from a sitting to standing position.  Roll over in bed.  Cough or sneeze.  Do physical activity. Follow these instructions at home:  Watch your condition for any changes.  When the pain starts, relax. Then try any of these methods to help with the pain: ? Sitting down. ? Flexing your knees up to your abdomen. ? Lying on your side with one pillow under your abdomen and another pillow between your legs. ? Sitting in a warm bath for 15-20 minutes or until the pain goes away.  Take over-the-counter and prescription medicines only as told by your health care provider.  Move slowly when you sit down or stand up.  Avoid long walks if they cause pain.  Stop or reduce your physical activities if they cause pain.  Keep all follow-up visits as told by your health care provider. This is important.   Contact a health care provider if:  Your pain does not go away with treatment.  You feel pain in your back that you did not have before.  Your medicine is not helping. Get help right away if:  You have a fever or chills.  You develop uterine contractions.  You have vaginal bleeding.  You have nausea or vomiting.  You have diarrhea.  You have pain  when you urinate. Summary  Round ligament pain is felt in the lower abdomen or groin. It is usually a short, sharp, and pinching pain. It can also be a dull, lingering, and aching pain.  This pain usually begins in the second trimester (13-28 weeks). It occurs because the uterus is stretching with the growing baby, and it is not harmful to the baby.  You may notice the pain when you suddenly change position, when you cough or sneeze, or during physical activity.  Relaxing, flexing your knees to your abdomen, lying on one side, or taking a warm bath may help to get rid of the pain.  Get help from your health care provider if the pain does not go away or if you have vaginal bleeding, nausea, vomiting, diarrhea, or painful urination. This information is not intended to replace advice given to you by your health care provider. Make sure you discuss any questions you have with your health care provider. Document Revised: 12/02/2017 Document Reviewed: 12/02/2017 Elsevier Patient Education  [place].

## 2020-11-07 NOTE — Progress Notes (Signed)
ROB: Doing well, no concerns today.

## 2020-12-06 ENCOUNTER — Ambulatory Visit (INDEPENDENT_AMBULATORY_CARE_PROVIDER_SITE_OTHER): Payer: No Typology Code available for payment source | Admitting: Certified Nurse Midwife

## 2020-12-06 ENCOUNTER — Encounter: Payer: Self-pay | Admitting: Certified Nurse Midwife

## 2020-12-06 ENCOUNTER — Other Ambulatory Visit: Payer: Self-pay

## 2020-12-06 VITALS — BP 111/74 | HR 101

## 2020-12-06 DIAGNOSIS — Z3402 Encounter for supervision of normal first pregnancy, second trimester: Secondary | ICD-10-CM

## 2020-12-06 DIAGNOSIS — Z3A25 25 weeks gestation of pregnancy: Secondary | ICD-10-CM

## 2020-12-06 LAB — POCT URINALYSIS DIPSTICK OB
Bilirubin, UA: NEGATIVE
Blood, UA: NEGATIVE
Glucose, UA: NEGATIVE
Ketones, UA: NEGATIVE
Nitrite, UA: NEGATIVE
Spec Grav, UA: 1.01 (ref 1.010–1.025)
Urobilinogen, UA: 0.2 E.U./dL
pH, UA: 8 (ref 5.0–8.0)

## 2020-12-06 NOTE — Progress Notes (Signed)
I have seen, interviewed, and examined the patient in conjunction with the [place] [facility] and affirm the diagnosis and management plan.   Diona Fanti, CNM [facility], [facility] 12/06/20 2:43 PM

## 2020-12-06 NOTE — Progress Notes (Signed)
ROB- Doing well overall. Reports decreased reflux since starting Nexium. ANATOMY SCAN scheduled Monday, 12/10/2020. Anticipatory guidance regarding course of prenatal care. Reviewed red flag symptoms and when to call. RTC x 3 weeks for 28 week labs, TDAP, and ROB with ANNIE or sooner if needed.   Daneil Dan, [place] 12/06/20 9:57 AM

## 2020-12-06 NOTE — Progress Notes (Signed)
ROB: She feels tired today, no new concerns.

## 2020-12-06 NOTE — Patient Instructions (Signed)
Common Medications Safe in Pregnancy  Acne:      Constipation:  Benzoyl Peroxide     Colace  Clindamycin      Dulcolax Suppository  Topica Erythromycin     Fibercon  Salicylic Acid      Metamucil         Miralax AVOID:        Senakot   Accutane    Cough:  Retin-A       Cough Drops  Tetracycline      Phenergan w/ Codeine if Rx  Minocycline      Robitussin (Plain & DM)  Antibiotics:     Crabs/Lice:  Ceclor       RID  Cephalosporins    AVOID:  E-Mycins      Kwell  Keflex  Macrobid/Macrodantin   Diarrhea:  Penicillin      Kao-Pectate  Zithromax      Imodium AD         PUSH FLUIDS AVOID:       Cipro     Fever:  Tetracycline      Tylenol (Regular or Extra  Minocycline       Strength)  Levaquin      Extra Strength-Do not          Exceed 8 tabs/24 hrs Caffeine:        <200mg/day (equiv. To 1 cup of coffee or  approx. 3 12 oz sodas)         Gas: Cold/Hayfever:       Gas-X  Benadryl      Mylicon  Claritin       Phazyme  **Claritin-D        Chlor-Trimeton    Headaches:  Dimetapp      ASA-Free Excedrin  Drixoral-Non-Drowsy     Cold Compress  Mucinex (Guaifenasin)     Tylenol (Regular or Extra  Sudafed/Sudafed-12 Hour     Strength)  **Sudafed PE Pseudoephedrine   Tylenol Cold & Sinus     Vicks Vapor Rub  Zyrtec  **AVOID if Problems With Blood Pressure         Heartburn: Avoid lying down for at least 1 hour after meals  Aciphex      Maalox     Rash:  Milk of Magnesia     Benadryl    Mylanta       1% Hydrocortisone Cream  Pepcid  Pepcid Complete   Sleep Aids:  Prevacid      Ambien   Prilosec       Benadryl  Rolaids       Chamomile Tea  Tums (Limit 4/day)     Unisom         Tylenol PM         Warm milk-add vanilla or  Hemorrhoids:       Sugar for taste  Anusol/Anusol H.C.  (RX: Analapram 2.5%)  Sugar Substitutes:  Hydrocortisone OTC     Ok in moderation  Preparation H      Tucks        Vaseline lotion applied to tissue with  wiping    Herpes:     Throat:  Acyclovir      Oragel  Famvir  Valtrex     Vaccines:         Flu Shot Leg Cramps:       *Gardasil  Benadryl      Hepatitis A         Hepatitis B Nasal Spray:         Pneumovax  Saline Nasal Spray     Polio Booster         Tetanus Nausea:       Tuberculosis test or PPD  Vitamin B6 25 mg TID   AVOID:    Dramamine      *Gardasil  Emetrol       Live Poliovirus  Ginger Root 250 mg QID    MMR (measles, mumps &  High Complex Carbs @ Bedtime    rebella)  Sea Bands-Accupressure    Varicella (Chickenpox)  Unisom 1/2 tab TID     *No known complications           If received before Pain:         Known pregnancy;   Darvocet       Resume series after  Lortab        Delivery  Percocet    Yeast:   Tramadol      Femstat  Tylenol 3      Gyne-lotrimin  Ultram       Monistat  Vicodin           MISC:         All Sunscreens           Hair Coloring/highlights          Insect Repellant's          (Including DEET)         Mystic Tans   Second Trimester of Pregnancy  The second trimester of pregnancy is from week 13 through week 27. This is also called months 4 through 6 of pregnancy. This is often the time when you feel your best. During the second trimester:  Morning sickness is less or has stopped.  You may have more energy.  You may feel hungry more often. At this time, your unborn baby (fetus) is growing very fast. At the end of the sixth month, the unborn baby may be up to 12 inches long and weigh about 1 pounds. You will likely start to feel the baby move between 16 and 20 weeks of pregnancy. Body changes during your second trimester Your body continues to go through many changes during this time. The changes vary and generally return to normal after the baby is born. Physical changes  You will gain more weight.  You may start to get stretch marks on your hips, belly (abdomen), and breasts.  Your breasts will grow and may hurt.  Dark spots or  blotches may develop on your face.  A dark line from your belly button to the pubic area (linea nigra) may appear.  You may have changes in your hair. Health changes  You may have headaches.  You may have heartburn.  You may have trouble pooping (constipation).  You may have hemorrhoids or swollen, bulging veins (varicose veins).  Your gums may bleed.  You may pee (urinate) more often.  You may have back pain. Follow these instructions at home: Medicines  Take over-the-counter and prescription medicines only as told by your doctor. Some medicines are not safe during pregnancy.  Take a prenatal vitamin that contains at least 600 micrograms (mcg) of folic acid. Eating and drinking  Eat healthy meals that include: ? Fresh fruits and vegetables. ? Whole grains. ? Good sources of protein, such as meat, eggs, or tofu. ? Low-fat dairy products.  Avoid raw meat and unpasteurized juice, milk, and cheese.  You may need to take these actions to prevent or treat   trouble pooping: ? Drink enough fluids to keep your pee (urine) pale yellow. ? Eat foods that are high in fiber. These include beans, whole grains, and fresh fruits and vegetables. ? Limit foods that are high in fat and sugar. These include fried or sweet foods. Activity  Exercise only as told by your doctor. Most people can do their usual exercise during pregnancy. Try to exercise for 30 minutes at least 5 days a week.  Stop exercising if you have pain or cramps in your belly or lower back.  Do not exercise if it is too hot or too humid, or if you are in a place of great height (high altitude).  Avoid heavy lifting.  If you choose to, you may have sex unless your doctor tells you not to. Relieving pain and discomfort  Wear a good support bra if your breasts are sore.  Take warm water baths (sitz baths) to soothe pain or discomfort caused by hemorrhoids. Use hemorrhoid cream if your doctor approves.  Rest with  your legs raised (elevated) if you have leg cramps or low back pain.  If you develop bulging veins in your legs: ? Wear support hose as told by your doctor. ? Raise your feet for 15 minutes, 3-4 times a day. ? Limit salt in your food. Safety  Wear your seat belt at all times when you are in a car.  Talk with your doctor if someone is hurting you or yelling at you a lot. Lifestyle  Do not use hot tubs, steam rooms, or saunas.  Do not douche. Do not use tampons or scented sanitary pads.  Avoid cat litter boxes and soil used by cats. These carry germs that can harm your baby and can cause a loss of your baby by miscarriage or stillbirth.  Do not use herbal medicines, illegal drugs, or medicines that are not approved by your doctor. Do not drink alcohol.  Do not smoke or use any products that contain nicotine or tobacco. If you need help quitting, ask your doctor. General instructions  Keep all follow-up visits. This is important.  Ask your doctor about local prenatal classes.  Ask your doctor about the right foods to eat or for help finding a counselor. Where to find more information  American Pregnancy Association: americanpregnancy.org  American College of Obstetricians and Gynecologists: www.acog.org  Office on Women's Health: womenshealth.gov/pregnancy Contact a doctor if:  You have a headache that does not go away when you take medicine.  You have changes in how you see, or you see spots in front of your eyes.  You have mild cramps, pressure, or pain in your lower belly.  You continue to feel like you may vomit (nauseous), you vomit, or you have watery poop (diarrhea).  You have bad-smelling fluid coming from your vagina.  You have pain when you pee or your pee smells bad.  You have very bad swelling of your face, hands, ankles, feet, or legs.  You have a fever. Get help right away if:  You are leaking fluid from your vagina.  You have spotting or bleeding  from your vagina.  You have very bad belly cramping or pain.  You have trouble breathing.  You have chest pain.  You faint.  You have not felt your baby move for the time period told by your doctor.  You have new or increased pain, swelling, or redness in an arm or leg. Summary  The second trimester of pregnancy is from week   13 through week 27 (months 4 through 6).  Eat healthy meals.  Exercise as told by your doctor. Most people can do their usual exercise during pregnancy.  Do not use herbal medicines, illegal drugs, or medicines that are not approved by your doctor. Do not drink alcohol.  Call your doctor if you get sick or if you notice anything unusual about your pregnancy. This information is not intended to replace advice given to you by your health care provider. Make sure you discuss any questions you have with your health care provider. Document Revised: 11/23/2019 Document Reviewed: 09/29/2019 Elsevier Patient Education  2021 Elsevier Inc.  

## 2020-12-10 ENCOUNTER — Ambulatory Visit
Admission: RE | Admit: 2020-12-10 | Discharge: 2020-12-10 | Disposition: A | Discharge status: Home / Self Care | Payer: No Typology Code available for payment source | Source: Ambulatory Visit | Attending: Certified Nurse Midwife | Admitting: Certified Nurse Midwife

## 2020-12-10 ENCOUNTER — Other Ambulatory Visit: Payer: Self-pay

## 2020-12-10 DIAGNOSIS — Z3482 Encounter for supervision of other normal pregnancy, second trimester: Secondary | ICD-10-CM | POA: Insufficient documentation

## 2020-12-10 DIAGNOSIS — Z3689 Encounter for other specified antenatal screening: Secondary | ICD-10-CM | POA: Diagnosis not present

## 2020-12-10 DIAGNOSIS — Z3A25 25 weeks gestation of pregnancy: Secondary | ICD-10-CM | POA: Insufficient documentation

## 2020-12-10 DIAGNOSIS — Z3A16 16 weeks gestation of pregnancy: Secondary | ICD-10-CM

## 2020-12-14 ENCOUNTER — Other Ambulatory Visit: Payer: Self-pay | Admitting: Certified Nurse Midwife

## 2020-12-14 DIAGNOSIS — O358XX0 Maternal care for other (suspected) fetal abnormality and damage, not applicable or unspecified: Secondary | ICD-10-CM

## 2020-12-14 DIAGNOSIS — Z3482 Encounter for supervision of other normal pregnancy, second trimester: Secondary | ICD-10-CM

## 2020-12-14 DIAGNOSIS — O35EXX0 Maternal care for other (suspected) fetal abnormality and damage, fetal genitourinary anomalies, not applicable or unspecified: Secondary | ICD-10-CM

## 2020-12-14 NOTE — Progress Notes (Signed)
Referral to MFM for fetal renal pyelectasis, see orders.    Dani Gobble, CNM [facility], [facility] 12/14/20 1:41 AM

## 2020-12-17 ENCOUNTER — Other Ambulatory Visit: Payer: Self-pay | Admitting: Certified Nurse Midwife

## 2020-12-17 DIAGNOSIS — O35EXX0 Maternal care for other (suspected) fetal abnormality and damage, fetal genitourinary anomalies, not applicable or unspecified: Secondary | ICD-10-CM

## 2020-12-27 ENCOUNTER — Other Ambulatory Visit: Payer: Self-pay

## 2020-12-27 DIAGNOSIS — Z3A28 28 weeks gestation of pregnancy: Secondary | ICD-10-CM

## 2020-12-28 ENCOUNTER — Other Ambulatory Visit: Payer: Self-pay

## 2020-12-28 ENCOUNTER — Encounter: Payer: Self-pay | Admitting: Certified Nurse Midwife

## 2020-12-28 ENCOUNTER — Other Ambulatory Visit: Payer: No Typology Code available for payment source

## 2020-12-28 ENCOUNTER — Ambulatory Visit (INDEPENDENT_AMBULATORY_CARE_PROVIDER_SITE_OTHER): Payer: No Typology Code available for payment source | Admitting: Certified Nurse Midwife

## 2020-12-28 VITALS — BP 106/70 | HR 101 | Wt 159.9 lb

## 2020-12-28 DIAGNOSIS — Z23 Encounter for immunization: Secondary | ICD-10-CM | POA: Diagnosis not present

## 2020-12-28 DIAGNOSIS — Z3483 Encounter for supervision of other normal pregnancy, third trimester: Secondary | ICD-10-CM

## 2020-12-28 DIAGNOSIS — Z3A28 28 weeks gestation of pregnancy: Secondary | ICD-10-CM

## 2020-12-28 LAB — POCT URINALYSIS DIPSTICK OB
Blood, UA: NEGATIVE
Glucose, UA: NEGATIVE
Spec Grav, UA: 1.01 (ref 1.010–1.025)
Urobilinogen, UA: 0.2 E.U./dL
pH, UA: 8 (ref 5.0–8.0)

## 2020-12-28 NOTE — Progress Notes (Signed)
ROB: She feels tired, no concerns today. BTC form signed and TDAP administered.

## 2020-12-28 NOTE — Patient Instructions (Signed)
Fetal Movement Counts Patient Name: ________________________________________________ Patient DueDate: ____________________ What is a fetal movement count?  A fetal movement count is the number of times that you feel your baby move during a certain amount of time. This may also be called a fetal kick count. A fetal movement count is recommended for every pregnant woman. You may be askedto start counting fetal movements as early as week 28 of your pregnancy. Pay attention to when your baby is most active. You may notice your baby's sleep and wake cycles. You may also notice things that make your baby move more. You should do a fetal movement count: When your baby is normally most active. At the same time each day. A good time to count movements is while you are resting, after having somethingto eat and drink. How do I count fetal movements? Find a quiet, comfortable area. Sit, or lie down on your side. Write down the date, the start time and stop time, and the number of movements that you felt between those two times. Take this information with you to your health care visits. Write down your start time when you feel the first movement. Count kicks, flutters, swishes, rolls, and jabs. You should feel at least 10 movements. You may stop counting after you have felt 10 movements, or if you have been counting for 2 hours. Write down the stop time. If you do not feel 10 movements in 2 hours, contact your health care provider for further instructions. Your health care provider may want to do additional tests to assess your baby's well-being. Contact a health care provider if: You feel fewer than 10 movements in 2 hours. Your baby is not moving like he or she usually does. Date: ____________ Start time: ____________ Stop time: ____________ Movements:____________ Date: ____________ Start time: ____________ Stop time: ____________ Movements:____________ Date: ____________ Start time: ____________ Stop  time: ____________ Movements:____________ Date: ____________ Start time: ____________ Stop time: ____________ Movements:____________ Date: ____________ Start time: ____________ Stop time: ____________ Movements:____________ Date: ____________ Start time: ____________ Stop time: ____________ Movements:____________ Date: ____________ Start time: ____________ Stop time: ____________ Movements:____________ Date: ____________ Start time: ____________ Stop time: ____________ Movements:____________ Date: ____________ Start time: ____________ Stop time: ____________ Movements:____________ This information is not intended to replace advice given to you by your health care provider. Make sure you discuss any questions you have with your healthcare provider. Document Revised: 02/03/2019 Document Reviewed: 02/03/2019 Elsevier Patient Education  [place].

## 2020-12-28 NOTE — Progress Notes (Signed)
ROB-Doing well, no questions or concerns. Breastfeeding education and Overview/Plan completed, see chart. 28 week labs today, see orders. Blood transfusion consent signed. TDaP given, see chart. Encouraged small frequent meals and protein drinks when working. Anticipatory guidance regarding course of prenatal care. MFM follow up ultrasound scheduled 01/24/2021 at 3pm. Reviewed red flag symptoms and when to call. RTC x 2 weeks for ROB or sooner if needed.

## 2020-12-29 LAB — CBC
Hematocrit: 31.8 % — ABNORMAL LOW (ref 34.0–46.6)
Hemoglobin: 10.8 g/dL — ABNORMAL LOW (ref 11.1–15.9)
MCH: 30.1 pg (ref 26.6–33.0)
MCHC: 34 g/dL (ref 31.5–35.7)
MCV: 89 fL (ref 79–97)
Platelets: 182 10*3/uL (ref 150–450)
RBC: 3.59 x10E6/uL — ABNORMAL LOW (ref 3.77–5.28)
RDW: 12.5 % (ref 11.7–15.4)
WBC: 9.2 10*3/uL (ref 3.4–10.8)

## 2020-12-29 LAB — RPR: RPR Ser Ql: NONREACTIVE

## 2020-12-29 LAB — GLUCOSE, 1 HOUR GESTATIONAL: Gestational Diabetes Screen: 135 mg/dL (ref 65–139)

## 2021-01-15 ENCOUNTER — Ambulatory Visit (INDEPENDENT_AMBULATORY_CARE_PROVIDER_SITE_OTHER): Payer: No Typology Code available for payment source | Admitting: Certified Nurse Midwife

## 2021-01-15 ENCOUNTER — Other Ambulatory Visit: Payer: Self-pay

## 2021-01-15 VITALS — BP 123/80 | HR 114 | Wt 162.1 lb

## 2021-01-15 DIAGNOSIS — Z3483 Encounter for supervision of other normal pregnancy, third trimester: Secondary | ICD-10-CM

## 2021-01-15 DIAGNOSIS — Z3A30 30 weeks gestation of pregnancy: Secondary | ICD-10-CM

## 2021-01-15 LAB — POCT URINALYSIS DIPSTICK OB
Bilirubin, UA: NEGATIVE
Blood, UA: NEGATIVE
Glucose, UA: NEGATIVE
Ketones, UA: NEGATIVE
Leukocytes, UA: NEGATIVE
Nitrite, UA: NEGATIVE
POC,PROTEIN,UA: NEGATIVE
Spec Grav, UA: <=1.005 — AB (ref 1.010–1.025)
Urobilinogen, UA: 0.2 E.U./dL
pH, UA: 8 (ref 5.0–8.0)

## 2021-01-15 NOTE — Patient Instructions (Signed)
[place] [facility] List  [facility]  [address], [place], [place] [address]  Phone: [phone]  [facility] (second location)  [address] [place], [place] [address]  Phone: [phone]  [facility] ([place]) [address], [place], [place] [address] Phone: [phone]  [facility]  [address] , [place] [address]  Phone: [phone] 

## 2021-01-15 NOTE — Progress Notes (Signed)
ROB doing well. Feels good fetal movement. RSB reviewed. See check list for topics reviewed. Pt encouraged to complete on line class. U/s 7/28 with MFM for follow up renal ptyalectasis. Discussed birth plan. She would like an epidural, plans to breastfeed,and is Naming baby "Janet Thompson"   FOB 2 wk ( already scheduled fro 1 wk after u/s)   Philip Aspen, CNM

## 2021-01-23 ENCOUNTER — Encounter: Payer: Self-pay | Admitting: Certified Nurse Midwife

## 2021-01-23 ENCOUNTER — Other Ambulatory Visit: Payer: Self-pay

## 2021-01-23 ENCOUNTER — Ambulatory Visit (INDEPENDENT_AMBULATORY_CARE_PROVIDER_SITE_OTHER): Payer: No Typology Code available for payment source | Admitting: Certified Nurse Midwife

## 2021-01-23 VITALS — BP 120/78 | HR 118 | Wt 166.2 lb

## 2021-01-23 DIAGNOSIS — Z3A32 32 weeks gestation of pregnancy: Secondary | ICD-10-CM

## 2021-01-23 DIAGNOSIS — Z3483 Encounter for supervision of other normal pregnancy, third trimester: Secondary | ICD-10-CM

## 2021-01-23 LAB — POCT URINALYSIS DIPSTICK OB
Bilirubin, UA: NEGATIVE
Blood, UA: NEGATIVE
Ketones, UA: NEGATIVE
Leukocytes, UA: NEGATIVE
Nitrite, UA: NEGATIVE
POC,PROTEIN,UA: NEGATIVE
Spec Grav, UA: 1.015 (ref 1.010–1.025)
Urobilinogen, UA: 0.2 E.U./dL
pH, UA: 6.5 (ref 5.0–8.0)

## 2021-01-23 NOTE — Progress Notes (Signed)
ROB doing well. Feels good movement. Discussed MDs assisting with call and visit as needed. She verbalizes understanding and agrees. She has no concerns today. States she has more pelvic pressure as baby is increasing in sizes. She is using the belly band prn. ROB in 2 wk  Philip Aspen, CNM

## 2021-01-23 NOTE — Patient Instructions (Signed)
Fetal Movement Counts Patient Name: ________________________________________________ Patient DueDate: ____________________ What is a fetal movement count?  A fetal movement count is the number of times that you feel your baby move during a certain amount of time. This may also be called a fetal kick count. A fetal movement count is recommended for every pregnant woman. You may be askedto start counting fetal movements as early as week 28 of your pregnancy. Pay attention to when your baby is most active. You may notice your baby's sleep and wake cycles. You may also notice things that make your baby move more. You should do a fetal movement count: When your baby is normally most active. At the same time each day. A good time to count movements is while you are resting, after having somethingto eat and drink. How do I count fetal movements? Find a quiet, comfortable area. Sit, or lie down on your side. Write down the date, the start time and stop time, and the number of movements that you felt between those two times. Take this information with you to your health care visits. Write down your start time when you feel the first movement. Count kicks, flutters, swishes, rolls, and jabs. You should feel at least 10 movements. You may stop counting after you have felt 10 movements, or if you have been counting for 2 hours. Write down the stop time. If you do not feel 10 movements in 2 hours, contact your health care provider for further instructions. Your health care provider may want to do additional tests to assess your baby's well-being. Contact a health care provider if: You feel fewer than 10 movements in 2 hours. Your baby is not moving like he or she usually does. Date: ____________ Start time: ____________ Stop time: ____________ Movements:____________ Date: ____________ Start time: ____________ Stop time: ____________ Movements:____________ Date: ____________ Start time: ____________ Stop  time: ____________ Movements:____________ Date: ____________ Start time: ____________ Stop time: ____________ Movements:____________ Date: ____________ Start time: ____________ Stop time: ____________ Movements:____________ Date: ____________ Start time: ____________ Stop time: ____________ Movements:____________ Date: ____________ Start time: ____________ Stop time: ____________ Movements:____________ Date: ____________ Start time: ____________ Stop time: ____________ Movements:____________ Date: ____________ Start time: ____________ Stop time: ____________ Movements:____________ This information is not intended to replace advice given to you by your health care provider. Make sure you discuss any questions you have with your healthcare provider. Document Revised: 02/03/2019 Document Reviewed: 02/03/2019 Elsevier Patient Education  [place].

## 2021-01-24 ENCOUNTER — Ambulatory Visit: Payer: No Typology Code available for payment source

## 2021-01-24 ENCOUNTER — Ambulatory Visit: Payer: No Typology Code available for payment source | Attending: Obstetrics and Gynecology

## 2021-01-24 ENCOUNTER — Other Ambulatory Visit: Payer: Self-pay | Admitting: Maternal & Fetal Medicine

## 2021-01-24 ENCOUNTER — Other Ambulatory Visit: Payer: Self-pay

## 2021-01-24 VITALS — BP 111/77 | HR 114 | Temp 97.6°F | Ht 69.0 in | Wt 166.5 lb

## 2021-01-24 DIAGNOSIS — Z3A32 32 weeks gestation of pregnancy: Secondary | ICD-10-CM

## 2021-01-24 DIAGNOSIS — O0993 Supervision of high risk pregnancy, unspecified, third trimester: Secondary | ICD-10-CM

## 2021-01-24 DIAGNOSIS — O358XX0 Maternal care for other (suspected) fetal abnormality and damage, not applicable or unspecified: Secondary | ICD-10-CM

## 2021-01-24 DIAGNOSIS — O35EXX0 Maternal care for other (suspected) fetal abnormality and damage, fetal genitourinary anomalies, not applicable or unspecified: Secondary | ICD-10-CM

## 2021-02-08 ENCOUNTER — Ambulatory Visit (INDEPENDENT_AMBULATORY_CARE_PROVIDER_SITE_OTHER): Payer: No Typology Code available for payment source | Admitting: Certified Nurse Midwife

## 2021-02-08 ENCOUNTER — Encounter: Payer: Self-pay | Admitting: Certified Nurse Midwife

## 2021-02-08 ENCOUNTER — Other Ambulatory Visit: Payer: Self-pay

## 2021-02-08 VITALS — BP 115/70 | HR 105 | Wt 166.3 lb

## 2021-02-08 DIAGNOSIS — Z3A34 34 weeks gestation of pregnancy: Secondary | ICD-10-CM

## 2021-02-08 DIAGNOSIS — Z3483 Encounter for supervision of other normal pregnancy, third trimester: Secondary | ICD-10-CM

## 2021-02-08 LAB — POCT URINALYSIS DIPSTICK OB
Bilirubin, UA: NEGATIVE
Blood, UA: NEGATIVE
Glucose, UA: NEGATIVE
Leukocytes, UA: NEGATIVE
Nitrite, UA: NEGATIVE
Spec Grav, UA: 1.015 (ref 1.010–1.025)
Urobilinogen, UA: NEGATIVE E.U./dL — AB
pH, UA: 6.5 (ref 5.0–8.0)

## 2021-02-08 NOTE — Patient Instructions (Signed)
Td (Tetanus, Diphtheria) Vaccine: What You Need to Know 1. Why get vaccinated? Td vaccine can prevent tetanus and diphtheria. Tetanus enters the body through cuts or wounds. Diphtheria spreads from person to person. TETANUS (T) causes painful stiffening of the muscles. Tetanus can lead to serious health problems, including being unable to open the mouth, having trouble swallowing and breathing, or death. DIPHTHERIA (D) can lead to difficulty breathing, heart failure, paralysis, or death. 2. Td vaccine Td is only for children 7 years and older, adolescents, and adults.  Td is usually given as a booster dose every 10 years, or after 5 years in the case of a severe or dirty wound or burn. Another vaccine, called "Tdap," may be used instead of Td. Tdap protects against pertussis, also known as "whooping cough," in addition to tetanus anddiphtheria. Td may be given at the same time as other vaccines. 3. Talk with your health care provider Tell your vaccination provider if the person getting the vaccine: Has had an allergic reaction after a previous dose of any vaccine that protects against tetanus or diphtheria, or has any severe, life-threatening allergies Has ever had Guillain-Barr Syndrome (also called "GBS") Has had severe pain or swelling after a previous dose of any vaccine that protects against tetanus or diphtheria In some cases, your health care provider may decide to postpone Td vaccinationuntil a future visit. People with minor illnesses, such as a cold, may be vaccinated. People who are moderately or severely ill should usually wait until they recover beforegetting Td vaccine.  Your health care provider can give you more information. 4. Risks of a vaccine reaction Pain, redness, or swelling where the shot was given, mild fever, headache, feeling tired, and nausea, vomiting, diarrhea, or stomachache sometimes happen after Td vaccination. People sometimes faint after medical procedures,  including vaccination. Tellyour provider if you feel dizzy or have vision changes or ringing in the ears.  As with any medicine, there is a very remote chance of a vaccine causing asevere allergic reaction, other serious injury, or death. 5. What if there is a serious problem? An allergic reaction could occur after the vaccinated person leaves the clinic. If you see signs of a severe allergic reaction (hives, swelling of the face and throat, difficulty breathing, a fast heartbeat, dizziness, or weakness), call 9-1-1and get the person to the nearest hospital.  For other signs that concern you, call your health care provider.  Adverse reactions should be reported to the Vaccine Adverse Event Reporting System (VAERS). Your health care provider will usually file this report, or you can do it yourself. Visit the VAERS website at www.vaers.SamedayNews.es or call (402)052-9196. VAERS is only for reporting reactions, and VAERS staff members do not give medical advice. 6. The National Vaccine Injury Compensation Program The [employer] Vaccine Injury Compensation Program (VICP) is a federal program that was created to compensate people who may have been injured by certain vaccines. Claims regarding alleged injury or death due to vaccination have a time limit for filing, which may be as short as two years. Visit the VICP website at GoldCloset.com.ee or call (416)031-8709to learn about the program and about filing a claim. 7. How can I learn more? Ask your health care provider. Call your local or state health department. Visit the website of the Food and Drug Administration (FDA) for vaccine package inserts and additional information at TraderRating.uy. Contact the Centers for Disease Control and Prevention (CDC): Call 4087804632 (1-800-CDC-INFO) or Visit CDC's website at http://hunter.com/. Vaccine Information Statement  Td (Tetanus, Diphtheria) Vaccine (02/03/2020) This  information is not intended to replace advice given to you by your health care provider. Make sure you discuss any questions you have with your healthcare provider. Document Revised: 03/22/2020 Document Reviewed: 03/22/2020 Elsevier Patient Education  [place] B Streptococcus Infection During Pregnancy Group B Streptococcus (GBS) is a type of bacteria that is often found in healthy people. It is commonly found in the rectum, vagina, and intestines. In people who are healthy and not pregnant, the bacteria rarely cause serious illness or complications. However, women who test positive for GBS during pregnancy can pass the bacteria to the baby during childbirth. This can cause serious infection in the babyafter birth. Women with GBS may also have infections during their pregnancy or soon after childbirth. The infections include urinary tract infections (UTIs) or infections of the uterus. GBS also increases a woman's risk of complications during pregnancy, such as early labor or delivery, miscarriage, or stillbirth.Routine testing for GBS is recommended for all pregnant women. What are the causes? This condition is caused by bacteria called Streptococcus agalactiae. What increases the risk? You may have a higher risk for GBS infection during pregnancy if you had oneduring a past pregnancy. What are the signs or symptoms? In most cases, GBS infection does not cause symptoms in pregnant women. If symptoms exist, they may include: Labor that starts before the 37th week of pregnancy. A UTI or bladder infection. This may cause a fever, frequent urination, or pain and burning during urination. Fever during labor. There can also be a rapid heartbeat in the mother or baby. Rare but serious symptoms of a GBS infection in women include: Blood infection (septicemia). This may cause fever, chills, or confusion. Lung infection (pneumonia). This may cause fever, chills, cough, rapid breathing, chest  pain, or difficulty breathing. Bone, joint, skin, or soft tissue infection. How is this diagnosed? You may be screened for GBS between week 35 and week 37 of pregnancy. If you have symptoms of preterm labor, you may be screened earlier. This condition is diagnosed based on lab test results from: A swab of fluid from the vagina and rectum. A urine sample. How is this treated? This condition is treated with antibiotic medicine. Antibiotic medicine may be given: To you when you go into labor, or as soon as your water breaks. The medicines will continue until after you give birth. If you are having a cesarean delivery, you do not need antibiotics unless your water has broken. To your baby, if he or she requires treatment. Your health care provider will check your baby to decide if he or she needs antibiotics to prevent a serious infection. Follow these instructions at home: Take over-the-counter and prescription medicines only as told by your health care provider. Take your antibiotic medicine as told by your health care provider. Do not stop taking the antibiotic even if you start to feel better. Keep all pre-birth (prenatal) visits and follow-up visits as told by your health care provider. This is important. Contact a health care provider if: You have pain or burning when you urinate. You have to urinate more often than usual. You have a fever or chills. You develop a bad-smelling vaginal discharge. Get help right away if: Your water breaks. You go into labor. You have severe pain in your abdomen. You have difficulty breathing. You have chest pain. These symptoms may represent a serious problem that is an emergency. Do not wait to see if the symptoms  will go away. Get medical help right away. Call your local emergency services (911 in the [place] Do not drive yourself to the hospital. Summary GBS is a type of bacteria that is common in healthy people. During pregnancy, colonization with GBS  can cause serious complications for you or your baby. Your health care provider will screen you between 35 and 37 weeks of pregnancy to determine if you are colonized with GBS. If you are colonized with GBS during pregnancy, your health care provider will recommend antibiotics through an IV during labor. After delivery, your baby will be evaluated for complications related to potential GBS infection and may require antibiotics to prevent a serious infection. This information is not intended to replace advice given to you by your health care provider. Make sure you discuss any questions you have with your healthcare provider. Document Revised: 04/17/2020 Document Reviewed: 01/10/2019 Elsevier Patient Education  [place].

## 2021-02-08 NOTE — Progress Notes (Signed)
ROB doing well. Feels good movement. Discussed GBS testing next appointment. She verbalizes and agree. Has some occasional tightening and pressure. Reassurance given. Discussed visit with MD's , she declines. Follow up 2 wk for ROB.   Philip Aspen, CNM

## 2021-02-21 NOTE — Telephone Encounter (Signed)
Spoke with patient on phone, patient complains of feeling faint with a headache. Patient states that she will try to take it easy, rest, drink plenty of fluids, and frequent snacks and that she feels she will be okay until her appointment with annie tomorrow morning. Patient verbalized no further questions or concerns.

## 2021-02-22 ENCOUNTER — Other Ambulatory Visit (HOSPITAL_COMMUNITY): Payer: Self-pay

## 2021-02-22 ENCOUNTER — Ambulatory Visit (INDEPENDENT_AMBULATORY_CARE_PROVIDER_SITE_OTHER): Payer: No Typology Code available for payment source | Admitting: Certified Nurse Midwife

## 2021-02-22 ENCOUNTER — Encounter: Payer: Self-pay | Admitting: Certified Nurse Midwife

## 2021-02-22 ENCOUNTER — Other Ambulatory Visit: Payer: Self-pay

## 2021-02-22 VITALS — BP 122/78 | HR 88 | Wt 170.6 lb

## 2021-02-22 DIAGNOSIS — Z3A36 36 weeks gestation of pregnancy: Secondary | ICD-10-CM

## 2021-02-22 DIAGNOSIS — Z3483 Encounter for supervision of other normal pregnancy, third trimester: Secondary | ICD-10-CM

## 2021-02-22 DIAGNOSIS — Z113 Encounter for screening for infections with a predominantly sexual mode of transmission: Secondary | ICD-10-CM

## 2021-02-22 LAB — POCT URINALYSIS DIPSTICK OB
Bilirubin, UA: NEGATIVE
Blood, UA: NEGATIVE
Glucose, UA: NEGATIVE
Nitrite, UA: NEGATIVE
POC,PROTEIN,UA: NEGATIVE
Spec Grav, UA: 1.01 (ref 1.010–1.025)
Urobilinogen, UA: 0.2 E.U./dL
pH, UA: 6 (ref 5.0–8.0)

## 2021-02-22 MED ORDER — QUICKVUE AT-HOME COVID-19 TEST VI KIT
PACK | 0 refills | Status: DC
  Filled 2021-02-22: qty 4, 4d supply, fill #0

## 2021-02-22 NOTE — Progress Notes (Signed)
ROB pt not feeling well today. She has been having headaches. She has history of migraines but recently have been more frequent. She has had episode of dizziness when in the shower. She has had nausea and fatigue. She denies possibility of COVID , state she has not be exposed and declines testing. BP is a little elated today. She denies epigastric pain and visual changes. Her reflexes are 2+, negative swelling, negative clonus. Labs collected today. Will follow up with results . GBS and swabs collected today. Follow up 1 wk.   Philip Aspen, CNM

## 2021-02-22 NOTE — Patient Instructions (Signed)

## 2021-02-23 LAB — CBC
Hematocrit: 32.6 % — ABNORMAL LOW (ref 34.0–46.6)
Hemoglobin: 10.7 g/dL — ABNORMAL LOW (ref 11.1–15.9)
MCH: 27.4 pg (ref 26.6–33.0)
MCHC: 32.8 g/dL (ref 31.5–35.7)
MCV: 84 fL (ref 79–97)
Platelets: 208 10*3/uL (ref 150–450)
RBC: 3.9 x10E6/uL (ref 3.77–5.28)
RDW: 13 % (ref 11.7–15.4)
WBC: 8.7 10*3/uL (ref 3.4–10.8)

## 2021-02-23 LAB — COMPREHENSIVE METABOLIC PANEL
ALT: 13 IU/L (ref 0–32)
AST: 20 IU/L (ref 0–40)
Albumin/Globulin Ratio: 1.6 (ref 1.2–2.2)
Albumin: 3.7 g/dL — ABNORMAL LOW (ref 3.8–4.8)
Alkaline Phosphatase: 209 IU/L — ABNORMAL HIGH (ref 44–121)
BUN/Creatinine Ratio: 9 (ref 9–23)
BUN: 4 mg/dL — ABNORMAL LOW (ref 6–20)
Bilirubin Total: 0.4 mg/dL (ref 0.0–1.2)
CO2: 22 mmol/L (ref 20–29)
Calcium: 8.8 mg/dL (ref 8.7–10.2)
Chloride: 102 mmol/L (ref 96–106)
Creatinine, Ser: 0.47 mg/dL — ABNORMAL LOW (ref 0.57–1.00)
Globulin, Total: 2.3 g/dL (ref 1.5–4.5)
Glucose: 71 mg/dL (ref 65–99)
Potassium: 4.6 mmol/L (ref 3.5–5.2)
Sodium: 138 mmol/L (ref 134–144)
Total Protein: 6 g/dL (ref 6.0–8.5)
eGFR: 130 mL/min/{1.73_m2} (ref >59)

## 2021-02-24 LAB — STREP GP B NAA: Strep Gp B NAA: NEGATIVE

## 2021-02-24 LAB — PROTEIN / CREATININE RATIO, URINE
Creatinine, Urine: 78.8 mg/dL
Protein, Ur: 14.3 mg/dL
Protein/Creat Ratio: 181 mg/g creat (ref 0–200)

## 2021-02-27 LAB — GC/CHLAMYDIA PROBE AMP
Chlamydia trachomatis, NAA: NEGATIVE
Neisseria Gonorrhoeae by PCR: NEGATIVE

## 2021-02-28 ENCOUNTER — Encounter: Payer: Self-pay | Admitting: Certified Nurse Midwife

## 2021-02-28 ENCOUNTER — Ambulatory Visit (INDEPENDENT_AMBULATORY_CARE_PROVIDER_SITE_OTHER): Payer: No Typology Code available for payment source | Admitting: Certified Nurse Midwife

## 2021-02-28 ENCOUNTER — Other Ambulatory Visit: Payer: Self-pay

## 2021-02-28 VITALS — BP 138/82 | HR 111 | Wt 171.8 lb

## 2021-02-28 DIAGNOSIS — L299 Pruritus, unspecified: Secondary | ICD-10-CM

## 2021-02-28 DIAGNOSIS — Z3A37 37 weeks gestation of pregnancy: Secondary | ICD-10-CM

## 2021-02-28 DIAGNOSIS — Z3483 Encounter for supervision of other normal pregnancy, third trimester: Secondary | ICD-10-CM

## 2021-02-28 LAB — POCT URINALYSIS DIPSTICK OB
Bilirubin, UA: NEGATIVE
Blood, UA: NEGATIVE
Glucose, UA: NEGATIVE
Ketones, UA: NEGATIVE
Nitrite, UA: NEGATIVE
Spec Grav, UA: 1.01 (ref 1.010–1.025)
Urobilinogen, UA: 0.2 E.U./dL
pH, UA: 7 (ref 5.0–8.0)

## 2021-02-28 NOTE — Patient Instructions (Signed)
Braxton Hicks Contractions Contractions of the uterus can occur throughout pregnancy, but they are not always a sign that you are in labor. You may have practice contractions called Braxton Hicks contractions. These false labor contractions are sometimes confused with true labor. What are [place] contractions? Braxton Hicks contractions are tightening movements that occur in the muscles of the uterus before labor. Unlike true labor contractions, these contractions do not result in opening (dilation) and thinning of the lowest part of the uterus (cervix). Toward the end of pregnancy (32-34 weeks), Braxton Hicks contractions can happen more often and may become stronger. These contractions are sometimes difficult to tell apart from true labor because they can be very uncomfortable. How to tell the difference between true labor and false labor True labor Contractions last 30-70 seconds. Contractions become very regular. Discomfort is usually felt in the top of the uterus, and it spreads to the lower abdomen and low back. Contractions do not go away with walking. Contractions usually become stronger and more frequent. The cervix dilates and gets thinner. False labor Contractions are usually shorter, weaker, and farther apart than true labor contractions. Contractions are usually irregular. Contractions are often felt in the front of the lower abdomen and in the groin. Contractions may go away when you walk around or change positions while lying down. The cervix usually does not dilate or become thin. Sometimes, the only way to tell if you are in true labor is for your health care provider to look for changes in your cervix. Your health care provider will do a physical exam and may monitor your contractions. If you are in true labor, your health care provider will send you home with instructions about when to return to the hospital. You may continue to have Braxton Hicks contractions until you  go into true labor. Follow these instructions at home:  Take over-the-counter and prescription medicines only as told by your health care provider. If Braxton Hicks contractions are making you uncomfortable: Change your position from lying down or resting to walking, or change from walking to resting. Sit and rest in a tub of warm water. Drink enough fluid to keep your urine pale yellow. Dehydration may cause these contractions. Do slow and deep breathing several times an hour. Keep all follow-up visits. This is important. Contact a health care provider if: You have a fever. You have continuous pain in your abdomen. Your contractions become stronger, more regular, and closer together. You pass blood-tinged mucus. Get help right away if: You have fluid leaking or gushing from your vagina. You have bright red blood coming from your vagina. Your baby is not moving inside you as much as it used to. Summary You may have practice contractions called Braxton Hicks contractions. These false labor contractions are sometimes confused with true labor. Braxton Hicks contractions are usually shorter, weaker, farther apart, and less regular than true labor contractions. True labor contractions usually become stronger, more regular, and more frequent. Manage discomfort from [facility] contractions by changing position, resting in a warm bath, practicing deep breathing, and drinking plenty of water. Keep all follow-up visits. Contact your health care provider if your contractions become stronger, more regular, and closer together. This information is not intended to replace advice given to you by your health care provider. Make sure you discuss any questions you have with your health care provider. Document Revised: 04/23/2020 Document Reviewed: 04/23/2020 Elsevier Patient Education  2022 [employer].

## 2021-02-28 NOTE — Progress Notes (Signed)
ROB pt state she is feeling good movement. Her migraines have improved. Notes significant fatigue. This past few days has had itching all over. No rash seen. Labs drawn . Discussed cholestasis of pregnancy and normal dermatological changes. SVE per pt request 4/70/-2 . Discussed red flag symptoms. She will follow up one week with MD for ROB or PRN.   Philip Aspen, CNM

## 2021-03-01 LAB — COMPREHENSIVE METABOLIC PANEL
ALT: 12 IU/L (ref 0–32)
AST: 15 IU/L (ref 0–40)
Albumin/Globulin Ratio: 1.5 (ref 1.2–2.2)
Albumin: 3.6 g/dL — ABNORMAL LOW (ref 3.8–4.8)
Alkaline Phosphatase: 211 IU/L — ABNORMAL HIGH (ref 44–121)
BUN/Creatinine Ratio: 10 (ref 9–23)
BUN: 5 mg/dL — ABNORMAL LOW (ref 6–20)
Bilirubin Total: 0.4 mg/dL (ref 0.0–1.2)
CO2: 19 mmol/L — ABNORMAL LOW (ref 20–29)
Calcium: 8.6 mg/dL — ABNORMAL LOW (ref 8.7–10.2)
Chloride: 99 mmol/L (ref 96–106)
Creatinine, Ser: 0.49 mg/dL — ABNORMAL LOW (ref 0.57–1.00)
Globulin, Total: 2.4 g/dL (ref 1.5–4.5)
Glucose: 66 mg/dL (ref 65–99)
Potassium: 4.2 mmol/L (ref 3.5–5.2)
Sodium: 136 mmol/L (ref 134–144)
Total Protein: 6 g/dL (ref 6.0–8.5)
eGFR: 128 mL/min/{1.73_m2} (ref >59)

## 2021-03-01 NOTE — Progress Notes (Signed)
Pt made aware of results via MyChart message.

## 2021-03-07 ENCOUNTER — Ambulatory Visit (INDEPENDENT_AMBULATORY_CARE_PROVIDER_SITE_OTHER): Payer: No Typology Code available for payment source | Admitting: Obstetrics and Gynecology

## 2021-03-07 ENCOUNTER — Encounter: Payer: Self-pay | Admitting: Obstetrics and Gynecology

## 2021-03-07 ENCOUNTER — Other Ambulatory Visit: Payer: Self-pay

## 2021-03-07 VITALS — BP 130/79 | HR 98 | Wt 171.5 lb

## 2021-03-07 DIAGNOSIS — Z3403 Encounter for supervision of normal first pregnancy, third trimester: Secondary | ICD-10-CM

## 2021-03-07 DIAGNOSIS — Z3A38 38 weeks gestation of pregnancy: Secondary | ICD-10-CM

## 2021-03-07 LAB — POCT URINALYSIS DIPSTICK OB
Bilirubin, UA: NEGATIVE
Blood, UA: NEGATIVE
Glucose, UA: NEGATIVE
Ketones, UA: NEGATIVE
Leukocytes, UA: NEGATIVE
Nitrite, UA: NEGATIVE
Spec Grav, UA: 1.025 (ref 1.010–1.025)
Urobilinogen, UA: 0.2 E.U./dL
pH, UA: 6 (ref 5.0–8.0)

## 2021-03-07 NOTE — Progress Notes (Signed)
ROB: Feeling tired but otherwise well.  No regular contractions.  Daily fetal movement.  Declines cervical exam today.

## 2021-03-07 NOTE — Progress Notes (Signed)
ROB: She has no concerns today. She is tired and has lots of pelvic pressure and it is uncomfortable to walk at times. She does not want to have her cervix checked today.

## 2021-03-12 ENCOUNTER — Inpatient Hospital Stay: Payer: No Typology Code available for payment source | Admitting: Registered Nurse

## 2021-03-12 ENCOUNTER — Encounter: Payer: Self-pay | Admitting: Obstetrics and Gynecology

## 2021-03-12 ENCOUNTER — Other Ambulatory Visit: Payer: Self-pay

## 2021-03-12 ENCOUNTER — Inpatient Hospital Stay
Admission: EM | Admit: 2021-03-12 | Discharge: 2021-03-14 | DRG: 776 | Disposition: A | Discharge status: Home / Self Care | Payer: No Typology Code available for payment source | Attending: Certified Nurse Midwife | Admitting: Certified Nurse Midwife

## 2021-03-12 ENCOUNTER — Encounter: Payer: Self-pay | Admitting: Certified Nurse Midwife

## 2021-03-12 ENCOUNTER — Ambulatory Visit (INDEPENDENT_AMBULATORY_CARE_PROVIDER_SITE_OTHER): Payer: No Typology Code available for payment source | Admitting: Certified Nurse Midwife

## 2021-03-12 VITALS — BP 120/75 | HR 103 | Wt 172.6 lb

## 2021-03-12 DIAGNOSIS — O26893 Other specified pregnancy related conditions, third trimester: Secondary | ICD-10-CM | POA: Diagnosis present

## 2021-03-12 DIAGNOSIS — G43909 Migraine, unspecified, not intractable, without status migrainosus: Secondary | ICD-10-CM | POA: Diagnosis present

## 2021-03-12 DIAGNOSIS — Z20822 Contact with and (suspected) exposure to covid-19: Secondary | ICD-10-CM | POA: Diagnosis present

## 2021-03-12 DIAGNOSIS — Z3403 Encounter for supervision of normal first pregnancy, third trimester: Secondary | ICD-10-CM

## 2021-03-12 DIAGNOSIS — Z23 Encounter for immunization: Secondary | ICD-10-CM | POA: Diagnosis not present

## 2021-03-12 DIAGNOSIS — O99355 Diseases of the nervous system complicating the puerperium: Secondary | ICD-10-CM | POA: Diagnosis present

## 2021-03-12 DIAGNOSIS — Z3A38 38 weeks gestation of pregnancy: Secondary | ICD-10-CM

## 2021-03-12 LAB — RESP PANEL BY RT-PCR (FLU A&B, COVID) ARPGX2
Influenza A by PCR: NEGATIVE
Influenza B by PCR: NEGATIVE
SARS Coronavirus 2 by RT PCR: NEGATIVE

## 2021-03-12 LAB — POCT URINALYSIS DIPSTICK OB
Blood, UA: NEGATIVE
Glucose, UA: NEGATIVE
Ketones, UA: NEGATIVE
Nitrite, UA: NEGATIVE
Odor: NEGATIVE
POC,PROTEIN,UA: NEGATIVE
Spec Grav, UA: 1.02 (ref 1.010–1.025)
Urobilinogen, UA: 0.2 E.U./dL
pH, UA: 6 (ref 5.0–8.0)

## 2021-03-12 LAB — CBC
HCT: 29.7 % — ABNORMAL LOW (ref 36.0–46.0)
Hemoglobin: 9.8 g/dL — ABNORMAL LOW (ref 12.0–15.0)
MCH: 27 pg (ref 26.0–34.0)
MCHC: 33 g/dL (ref 30.0–36.0)
MCV: 81.8 fL (ref 80.0–100.0)
Platelets: 174 10*3/uL (ref 150–400)
RBC: 3.63 MIL/uL — ABNORMAL LOW (ref 3.87–5.11)
RDW: 13.6 % (ref 11.5–15.5)
WBC: 11.4 10*3/uL — ABNORMAL HIGH (ref 4.0–10.5)
nRBC: 0 % (ref 0.0–0.2)

## 2021-03-12 LAB — TYPE AND SCREEN
ABO/RH(D): A POS
Antibody Screen: NEGATIVE

## 2021-03-12 MED ORDER — SOD CITRATE-CITRIC ACID 500-334 MG/5ML PO SOLN
30.0000 mL | ORAL | Status: DC | PRN
Start: 2021-03-12 — End: 2021-03-13

## 2021-03-12 MED ORDER — OXYTOCIN BOLUS FROM INFUSION
333.0000 mL | Freq: Once | INTRAVENOUS | Status: AC
  Administered 2021-03-12: 333 mL via INTRAVENOUS

## 2021-03-12 MED ORDER — OXYTOCIN-SODIUM CHLORIDE 30-0.9 UT/500ML-% IV SOLN: 2.5000 [IU]/h | INTRAVENOUS | Status: DC

## 2021-03-12 MED ORDER — BUPIVACAINE HCL (PF) 0.25 % IJ SOLN
INTRAMUSCULAR | Status: DC | PRN
  Administered 2021-03-12 (×2): 4 mL via EPIDURAL

## 2021-03-12 MED ORDER — LIDOCAINE HCL (PF) 1 % IJ SOLN: 30.0000 mL | INTRAMUSCULAR | Status: DC | PRN

## 2021-03-12 MED ORDER — LACTATED RINGERS IV SOLN
INTRAVENOUS | Status: DC
  Administered 2021-03-12: 1 mL via INTRAVENOUS

## 2021-03-12 MED ORDER — FENTANYL-BUPIVACAINE-NACL 0.5-0.125-0.9 MG/250ML-% EP SOLN
EPIDURAL | Status: AC
  Filled 2021-03-12: qty 250

## 2021-03-12 MED ORDER — IBUPROFEN 600 MG PO TABS
600.0000 mg | ORAL_TABLET | Freq: Four times a day (QID) | ORAL | Status: DC
  Administered 2021-03-12 – 2021-03-13 (×2): 600 mg via ORAL
  Filled 2021-03-12: qty 1

## 2021-03-12 MED ORDER — BENZOCAINE-MENTHOL 20-0.5 % EX AERO
1.0000 "application " | INHALATION_SPRAY | CUTANEOUS | Status: DC | PRN
  Administered 2021-03-12: 1 via TOPICAL
  Filled 2021-03-12: qty 56

## 2021-03-12 MED ORDER — MISOPROSTOL 200 MCG PO TABS
ORAL_TABLET | ORAL | Status: AC
  Filled 2021-03-12: qty 4

## 2021-03-12 MED ORDER — AMMONIA AROMATIC IN INHA
RESPIRATORY_TRACT | Status: AC
  Filled 2021-03-12: qty 10

## 2021-03-12 MED ORDER — FENTANYL-BUPIVACAINE-NACL 0.5-0.125-0.9 MG/250ML-% EP SOLN
EPIDURAL | Status: DC | PRN
  Administered 2021-03-12: 12 mL/h via EPIDURAL

## 2021-03-12 MED ORDER — OXYTOCIN-SODIUM CHLORIDE 30-0.9 UT/500ML-% IV SOLN
1.0000 m[IU]/min | INTRAVENOUS | Status: DC
Start: 2021-03-12 — End: 2021-03-12
  Administered 2021-03-12: 2 m[IU]/min via INTRAVENOUS
  Filled 2021-03-12: qty 500

## 2021-03-12 MED ORDER — TERBUTALINE SULFATE 1 MG/ML IJ SOLN: 0.2500 mg | Freq: Once | INTRAMUSCULAR | Status: DC | PRN

## 2021-03-12 MED ORDER — OXYTOCIN 10 UNIT/ML IJ SOLN
INTRAMUSCULAR | Status: AC
  Filled 2021-03-12: qty 2

## 2021-03-12 MED ORDER — LIDOCAINE HCL (PF) 1 % IJ SOLN
INTRAMUSCULAR | Status: DC | PRN
  Administered 2021-03-12: 3 mL

## 2021-03-12 MED ORDER — IBUPROFEN 600 MG PO TABS
ORAL_TABLET | ORAL | Status: AC
  Filled 2021-03-12: qty 1

## 2021-03-12 MED ORDER — LIDOCAINE-EPINEPHRINE (PF) 1.5 %-1:200000 IJ SOLN
INTRAMUSCULAR | Status: DC | PRN
  Administered 2021-03-12: 3 mL via EPIDURAL

## 2021-03-12 MED ORDER — LACTATED RINGERS IV SOLN: 500.0000 mL | INTRAVENOUS | Status: DC | PRN

## 2021-03-12 MED ORDER — LIDOCAINE HCL (PF) 1 % IJ SOLN
INTRAMUSCULAR | Status: AC
  Filled 2021-03-12: qty 30

## 2021-03-12 MED ORDER — ONDANSETRON HCL 4 MG/2ML IJ SOLN
4.0000 mg | Freq: Four times a day (QID) | INTRAMUSCULAR | Status: DC | PRN
  Administered 2021-03-12: 4 mg via INTRAVENOUS
  Filled 2021-03-12: qty 2

## 2021-03-12 MED ORDER — ACETAMINOPHEN 325 MG PO TABS: 650.0000 mg | ORAL_TABLET | ORAL | Status: DC | PRN

## 2021-03-12 MED ORDER — BUTORPHANOL TARTRATE 1 MG/ML IJ SOLN
1.0000 mg | INTRAMUSCULAR | Status: DC | PRN
Start: 2021-03-12 — End: 2021-03-12

## 2021-03-12 NOTE — Patient Instructions (Signed)
Braxton Hicks Contractions Contractions of the uterus can occur throughout pregnancy, but they are not always a sign that you are in labor. You may have practice contractions called Braxton Hicks contractions. These false labor contractions are sometimes confused with true labor. What are [place] contractions? Braxton Hicks contractions are tightening movements that occur in the muscles of the uterus before labor. Unlike true labor contractions, these contractions do not result in opening (dilation) and thinning of the lowest part of the uterus (cervix). Toward the end of pregnancy (32-34 weeks), Braxton Hicks contractions can happen more often and may become stronger. These contractions are sometimes difficult to tell apart from true labor because they can be very uncomfortable. How to tell the difference between true labor and false labor True labor Contractions last 30-70 seconds. Contractions become very regular. Discomfort is usually felt in the top of the uterus, and it spreads to the lower abdomen and low back. Contractions do not go away with walking. Contractions usually become stronger and more frequent. The cervix dilates and gets thinner. False labor Contractions are usually shorter, weaker, and farther apart than true labor contractions. Contractions are usually irregular. Contractions are often felt in the front of the lower abdomen and in the groin. Contractions may go away when you walk around or change positions while lying down. The cervix usually does not dilate or become thin. Sometimes, the only way to tell if you are in true labor is for your health care provider to look for changes in your cervix. Your health care provider will do a physical exam and may monitor your contractions. If you are in true labor, your health care provider will send you home with instructions about when to return to the hospital. You may continue to have Braxton Hicks contractions until you  go into true labor. Follow these instructions at home:  Take over-the-counter and prescription medicines only as told by your health care provider. If Braxton Hicks contractions are making you uncomfortable: Change your position from lying down or resting to walking, or change from walking to resting. Sit and rest in a tub of warm water. Drink enough fluid to keep your urine pale yellow. Dehydration may cause these contractions. Do slow and deep breathing several times an hour. Keep all follow-up visits. This is important. Contact a health care provider if: You have a fever. You have continuous pain in your abdomen. Your contractions become stronger, more regular, and closer together. You pass blood-tinged mucus. Get help right away if: You have fluid leaking or gushing from your vagina. You have bright red blood coming from your vagina. Your baby is not moving inside you as much as it used to. Summary You may have practice contractions called Braxton Hicks contractions. These false labor contractions are sometimes confused with true labor. Braxton Hicks contractions are usually shorter, weaker, farther apart, and less regular than true labor contractions. True labor contractions usually become stronger, more regular, and more frequent. Manage discomfort from [facility] contractions by changing position, resting in a warm bath, practicing deep breathing, and drinking plenty of water. Keep all follow-up visits. Contact your health care provider if your contractions become stronger, more regular, and closer together. This information is not intended to replace advice given to you by your health care provider. Make sure you discuss any questions you have with your health care provider. Document Revised: 04/23/2020 Document Reviewed: 04/23/2020 Elsevier Patient Education  2022 [employer].

## 2021-03-12 NOTE — Progress Notes (Signed)
LABOR NOTE   Janet Thompson 30 y.o.GP@ at [redacted]w[redacted]d SUBJECTIVE:  Ambulating in room.  Analgesia: Labor support without medications  OBJECTIVE:  BP 130/78 (BP Location: Left Arm)   Pulse (!) 117   Temp 98.4 F (36.9 C) (Oral)   Resp 18   Ht '5\' 8"'$  (1.727 m)   Wt 78 kg   LMP 06/13/2020 (Approximate)   BMI 26.15 kg/m  No intake/output data recorded.  She has shown cervical change. CERVIX: 7-8 cm :  80%:   -2:   mid position:   soft SVE:   Dilation: 7.5 Effacement (%): 80 Station: -2 Exam by:: APhilip Aspen CNM CONTRACTIONS: regular, every 2-4 minutes FHR: Fetal heart tracing reviewed.  FHT 145, intermittent monitoring  Category I    Labs: Lab Results  Component Value Date   WBC 11.4 (H) 03/12/2021   HGB 9.8 (L) 03/12/2021   HCT 29.7 (L) 03/12/2021   MCV 81.8 03/12/2021   PLT 174 03/12/2021    ASSESSMENT: 1) Labor curve reviewed.       Progress: Active phase labor.     Membranes: intact           Active Problems:   Labor and delivery, indication for care   PLAN: Pt requesting epidural. Plan to rupture membranes after epidural placement.    APhilip Aspen CNM  03/12/2021 2:53 PM

## 2021-03-12 NOTE — Progress Notes (Signed)
LABOR NOTE   Janet Thompson 30 y.o.GP@ at [redacted]w[redacted]d SUBJECTIVE:  Comfortable with epidural, having some mild rectal pressure. Analgesia: Epidural  OBJECTIVE:  BP 110/65   Pulse 92   Temp 98 F (36.7 C) (Oral)   Resp 16   Ht '5\' 8"'$  (1.727 m)   Wt 78 kg   LMP 06/13/2020 (Approximate)   SpO2 100%   BMI 26.15 kg/m  No intake/output data recorded.  She has shown cervical change. CERVIX: 10 cm:  100%:   -2:   mid position:    SVE:   Dilation: 8 Effacement (%): 80 Station: -1 Exam by:: MDiona FantiRN CONTRACTIONS: regular, every 2 minutes FHR: Fetal heart tracing reviewed. Baseline: 120 bpm, Variability: Good {> 6 bpm), Accelerations: Reactive, and Decelerations: Absent Category I    Labs: Lab Results  Component Value Date   WBC 11.4 (H) 03/12/2021   HGB 9.8 (L) 03/12/2021   HCT 29.7 (L) 03/12/2021   MCV 81.8 03/12/2021   PLT 174 03/12/2021    ASSESSMENT: 1) Labor curve reviewed.       Progress: Active phase labor.     Membranes: ruptured, clear fluid           Active Problems:   Labor and delivery, indication for care   PLAN: Start pushing.   APhilip Aspen CNM  03/12/2021 7:08 PM

## 2021-03-12 NOTE — Progress Notes (Signed)
ROB- Flu vaccine today.- Doesn't want to be pregnant anymore.

## 2021-03-12 NOTE — Anesthesia Procedure Notes (Signed)
Epidural Patient location during procedure: OB Start time: 03/12/2021 3:26 PM End time: 03/12/2021 3:36 PM  Staffing Anesthesiologist: Emmie Niemann, MD Resident/CRNA: Hedda Slade, CRNA Performed: resident/CRNA   Preanesthetic Checklist Completed: patient identified, IV checked, site marked, risks and benefits discussed, surgical consent, monitors and equipment checked, pre-op evaluation and timeout performed  Epidural Patient position: sitting Prep: ChloraPrep Patient monitoring: heart rate, continuous pulse ox and blood pressure Approach: midline Location: L3-L4 Injection technique: LOR air  Needle:  Needle type: Tuohy  Needle gauge: 17 G Needle length: 9 cm and 9 Needle insertion depth: 6 cm Catheter type: closed end flexible Catheter size: 19 Gauge Catheter at skin depth: 11 cm Test dose: negative and 1.5% lidocaine with Epi 1:200 K  Assessment Events: blood not aspirated, injection not painful, no injection resistance, no paresthesia and negative IV test  Additional Notes 1 attempt Pt. Evaluated and documentation done after procedure finished. Patient identified. Risks/Benefits/Options discussed with patient including but not limited to bleeding, infection, nerve damage, paralysis, failed block, incomplete pain control, headache, blood pressure changes, nausea, vomiting, reactions to medication both or allergic, itching and postpartum back pain. Confirmed with bedside nurse the patient's most recent platelet count. Confirmed with patient that they are not currently taking any anticoagulation, have any bleeding history or any family history of bleeding disorders. Patient expressed understanding and wished to proceed. All questions were answered. Sterile technique was used throughout the entire procedure. Please see nursing notes for vital signs. Test dose was given through epidural catheter and negative prior to continuing to dose epidural or start infusion. Warning signs of  high block given to the patient including shortness of breath, tingling/numbness in hands, complete motor block, or any concerning symptoms with instructions to call for help. Patient was given instructions on fall risk and not to get out of bed. All questions and concerns addressed with instructions to call with any issues or inadequate analgesia.    Patient tolerated the insertion well without immediate complications.Reason for block:procedure for pain

## 2021-03-12 NOTE — OB Triage Note (Signed)
Pt presented to L/D triage from home after office visit- where pt states she had her membranes stripped and was recommended to come to triage.Pt States that she is feeling vaginal pressure and abdominal cramping- rated 4/10. No bleeding or LOF and positive fetal movement. Monitors applied and assessing- FHT 145. VSS.

## 2021-03-12 NOTE — H&P (Signed)
History and Physical   HPI  Janet Thompson is a 30 y.o. G3P2002 at 20w6dEstimated Date of Delivery: 03/20/21 who is being admitted for labor management.    OB History  OB History  Gravida Para Term Preterm AB Living  '3 2 2 '$ 0 0 2  SAB IAB Ectopic Multiple Live Births  0 0 0 0 2    # Outcome Date GA Lbr Len/2nd Weight Sex Delivery Anes PTL Lv  3 Current           2 Term 11/04/18 348w0d 00:31 3884 g M Vag-Spont EPI N LIV     Name: Samaan,BOY Desia     Apgar1: 8  Apgar5: 9  1 Term 06/24/15 3814w5d:50 / 01:39 3760 g M Vag-Spont EPI  LIV     Name: PRESCAVAGE,BOY Felicita     Apgar1: 8  Apgar5: 9    PROBLEM LIST  Pregnancy complications or risks: Patient Active Problem List   Diagnosis Date Noted   Labor and delivery, indication for care 03/12/2021   Migraines 08/17/2017   Supervision of other normal pregnancy, antepartum 06/24/2015    Prenatal labs and studies: ABO, Rh: A/Positive/-- (02/24 1002) Antibody: Negative (02/24 1002) Rubella:   RPR: Non Reactive (07/01 1046)  HBsAg: Negative (02/24 1002)  HIV: Non Reactive (02/24 1002)  [id number](08/26 1055)   Past Medical History:  Diagnosis Date   Frequent headaches    History of fainting spells of unknown cause    Menstrual irregularity    Migraines    Placenta previa antepartum in second trimester 05/07/2018   Positive QuantiFERON-TB Gold test      Past Surgical History:  Procedure Laterality Date   WISDOM TOOTH EXTRACTION       Medications    Current Discharge Medication List     CONTINUE these medications which have NOT CHANGED   Details  Prenatal MV-Min-Fe Fum-FA-DHA (PRENATAL 1 PO) Take by mouth.    butalbital-acetaminophen-caffeine (FIORICET) 50-325-40 MG tablet Take 1-2 tablets by mouth daily as needed for migraine. Qty: 30 tablet, Refills: 0   Associated Diagnoses: Other migraine without status migrainosus, not intractable         Allergies  Patient has no known  allergies.  Review of Systems  Constitutional: negative Eyes: negative Ears, nose, mouth, throat, and face: negative Respiratory: negative Cardiovascular: negative Gastrointestinal: negative Genitourinary:negative Integument/breast: negative Hematologic/lymphatic: negative Musculoskeletal:negative Neurological: negative Behavioral/Psych: negative Endocrine: negative Allergic/Immunologic: negative  Physical Exam  BP 130/78 (BP Location: Left Arm)   Pulse (!) 117   Temp 98.4 F (36.9 C) (Oral)   Resp 18   Ht '5\' 8"'$  (1.727 m)   Wt 78 kg   LMP 06/13/2020 (Approximate)   BMI 26.15 kg/m   Lungs:  CTA B Cardio: RRR without M/R/G Abd: Soft, gravid, NT Presentation: cephalic EXT: No C/C/ 1+ Edema DTRs: 2+ B CERVIX: Dilation: 6 Effacement (%): 70 Cervical Position: Posterior Station: -2 Presentation: Vertex Exam by:: M SStark Klein  See Prenatal records for more detailed PE.     FHR:  Baseline: 140 bpm, Variability: Good {> 6 bpm), Accelerations: Reactive, and Decelerations: Absent  Toco: Uterine Contractions: Frequency: Every 2-4 minutes. Mild to moderate  Test Results  Results for orders placed or performed during the hospital encounter of 03/12/21 (from the past 24 hour(s))  Resp Panel by RT-PCR (Flu A&B, Covid) Nasopharyngeal Swab     Status: None   Collection Time: 03/12/21 11:56 AM   Specimen: Nasopharyngeal  Swab; Nasopharyngeal(NP) swabs in vial transport medium  Result Value Ref Range   SARS Coronavirus 2 by RT PCR NEGATIVE NEGATIVE   Influenza A by PCR NEGATIVE NEGATIVE   Influenza B by PCR NEGATIVE NEGATIVE   Group B Strep negative  Assessment   G3P2002 at 63w6dEstimated Date of Delivery: 03/20/21  The fetus is reassuring.   Patient Active Problem List   Diagnosis Date Noted   Labor and delivery, indication for care 03/12/2021   Migraines 08/17/2017   Supervision of other normal pregnancy, antepartum 06/24/2015    Plan  1. Admit to L&D :    2. EFM:-- Category 1 3. Stadol or Epidural if desired.   4. Admission labs  5. AROM once admitted.   APhilip Aspen CNM  03/12/2021 1:05 PM

## 2021-03-12 NOTE — Progress Notes (Signed)
LABOR NOTE   Janet Thompson 30 y.o.GP@ at [redacted]w[redacted]d SUBJECTIVE:  Comfortable with epidural  Analgesia: Epidural  OBJECTIVE:  BP 129/81   Pulse (!) 108   Temp 98.4 F (36.9 C) (Oral)   Resp 18   Ht '5\' 8"'$  (1.727 m)   Wt 78 kg   LMP 06/13/2020 (Approximate)   SpO2 100%   BMI 26.15 kg/m  No intake/output data recorded.  She has shown cervical change. CERVIX: 8 cm :  80%:   -2:   mid position:   floppy SVE:   Dilation: 8 Effacement (%): 80 Station: -2 Exam by:: APhilip AspenCNM CONTRACTIONS: regular, every 3-4 minutes FHR: Fetal heart tracing reviewed. Baseline: 125 bpm, Variability: Good {> 6 bpm), Accelerations: Reactive, and Decelerations: Absent Category I    Labs: Lab Results  Component Value Date   WBC 11.4 (H) 03/12/2021   HGB 9.8 (L) 03/12/2021   HCT 29.7 (L) 03/12/2021   MCV 81.8 03/12/2021   PLT 174 03/12/2021    ASSESSMENT: 1) Labor curve reviewed.       Progress: Active phase labor.     Membranes: ruptured, clear fluid           Active Problems:   Labor and delivery, indication for care   PLAN: IV Pitocin augmentation prn   APhilip Aspen CNM. 03/12/2021 4:30 PM

## 2021-03-12 NOTE — Anesthesia Preprocedure Evaluation (Signed)
Anesthesia Evaluation  Patient identified by MRN, date of birth, ID band Patient awake    Reviewed: Allergy & Precautions, H&P , NPO status , Patient's Chart, lab work & pertinent test results  Airway Mallampati: II  TM Distance: >3 FB Neck ROM: full    Dental no notable dental hx. (+) Teeth Intact   Pulmonary    Pulmonary exam normal        Cardiovascular Normal cardiovascular exam     Neuro/Psych  Headaches, negative psych ROS   GI/Hepatic Neg liver ROS, GERD  Poorly Controlled,  Endo/Other  negative endocrine ROS  Renal/GU negative Renal ROS     Musculoskeletal   Abdominal   Peds  Hematology negative hematology ROS (+)   Anesthesia Other Findings   Reproductive/Obstetrics (+) Pregnancy                             Anesthesia Physical Anesthesia Plan  ASA: 2  Anesthesia Plan: Epidural   Post-op Pain Management:  Regional for Post-op pain   Induction:   PONV Risk Score and Plan:   Airway Management Planned:   Additional Equipment:   Intra-op Plan:   Post-operative Plan:   Informed Consent: I have reviewed the patients History and Physical, chart, labs and discussed the procedure including the risks, benefits and alternatives for the proposed anesthesia with the patient or authorized representative who has indicated his/her understanding and acceptance.     Dental Advisory Given  Plan Discussed with: Anesthesiologist and CRNA  Anesthesia Plan Comments:         Anesthesia Quick Evaluation

## 2021-03-12 NOTE — Progress Notes (Signed)
ROB doing well, feeling regular fetal movement. Reviewed labor precautions. Uncomfortable and exhausted. Has had some episodes of ctx. SVE: 5cm/ 70/-2 station. Membranes stripped. PT advised to ambulate for a few hrs, do some nipple stimulation and go to hospital with contractions that are regular or for SROM.    Philip Aspen, CNM

## 2021-03-13 LAB — CBC
HCT: 26 % — ABNORMAL LOW (ref 36.0–46.0)
Hemoglobin: 8.8 g/dL — ABNORMAL LOW (ref 12.0–15.0)
MCH: 28 pg (ref 26.0–34.0)
MCHC: 33.8 g/dL (ref 30.0–36.0)
MCV: 82.8 fL (ref 80.0–100.0)
Platelets: 154 10*3/uL (ref 150–400)
RBC: 3.14 MIL/uL — ABNORMAL LOW (ref 3.87–5.11)
RDW: 13.7 % (ref 11.5–15.5)
WBC: 11 10*3/uL — ABNORMAL HIGH (ref 4.0–10.5)
nRBC: 0 % (ref 0.0–0.2)

## 2021-03-13 LAB — RPR: RPR Ser Ql: NONREACTIVE

## 2021-03-13 MED ORDER — IBUPROFEN 600 MG PO TABS
600.0000 mg | ORAL_TABLET | Freq: Four times a day (QID) | ORAL | Status: DC
  Administered 2021-03-13 – 2021-03-14 (×5): 600 mg via ORAL
  Filled 2021-03-13 (×5): qty 1

## 2021-03-13 MED ORDER — METHYLERGONOVINE MALEATE 0.2 MG/ML IJ SOLN
0.2000 mg | INTRAMUSCULAR | Status: DC | PRN
Start: 2021-03-13 — End: 2021-03-14

## 2021-03-13 MED ORDER — METHYLERGONOVINE MALEATE 0.2 MG PO TABS: 0.2000 mg | ORAL_TABLET | ORAL | Status: DC | PRN

## 2021-03-13 MED ORDER — ONDANSETRON HCL 4 MG/2ML IJ SOLN: 4.0000 mg | INTRAMUSCULAR | Status: DC | PRN

## 2021-03-13 MED ORDER — FERROUS SULFATE 325 (65 FE) MG PO TABS
325.0000 mg | ORAL_TABLET | Freq: Every day | ORAL | Status: DC
  Administered 2021-03-13 – 2021-03-14 (×2): 325 mg via ORAL
  Filled 2021-03-13 (×2): qty 1

## 2021-03-13 MED ORDER — DIBUCAINE (PERIANAL) 1 % EX OINT: 1.0000 "application " | TOPICAL_OINTMENT | CUTANEOUS | Status: DC | PRN

## 2021-03-13 MED ORDER — DOCUSATE SODIUM 100 MG PO CAPS
100.0000 mg | ORAL_CAPSULE | Freq: Two times a day (BID) | ORAL | Status: DC
  Administered 2021-03-14: 100 mg via ORAL
  Filled 2021-03-13: qty 1

## 2021-03-13 MED ORDER — SIMETHICONE 80 MG PO CHEW: 80.0000 mg | CHEWABLE_TABLET | ORAL | Status: DC | PRN

## 2021-03-13 MED ORDER — SENNOSIDES-DOCUSATE SODIUM 8.6-50 MG PO TABS
2.0000 | ORAL_TABLET | ORAL | Status: DC
  Administered 2021-03-13: 2 via ORAL
  Filled 2021-03-13 (×2): qty 2

## 2021-03-13 MED ORDER — WITCH HAZEL-GLYCERIN EX PADS: 1.0000 "application " | MEDICATED_PAD | CUTANEOUS | Status: DC | PRN

## 2021-03-13 MED ORDER — OXYCODONE-ACETAMINOPHEN 5-325 MG PO TABS: 2.0000 | ORAL_TABLET | ORAL | Status: DC | PRN

## 2021-03-13 MED ORDER — COCONUT OIL OIL: 1.0000 "application " | TOPICAL_OIL | Status: DC | PRN

## 2021-03-13 MED ORDER — OXYCODONE-ACETAMINOPHEN 5-325 MG PO TABS: 1.0000 | ORAL_TABLET | ORAL | Status: DC | PRN

## 2021-03-13 MED ORDER — PRENATAL MULTIVITAMIN CH
1.0000 | ORAL_TABLET | Freq: Every day | ORAL | Status: DC
  Administered 2021-03-13 – 2021-03-14 (×2): 1 via ORAL
  Filled 2021-03-13 (×2): qty 1

## 2021-03-13 MED ORDER — ONDANSETRON HCL 4 MG PO TABS: 4.0000 mg | ORAL_TABLET | ORAL | Status: DC | PRN

## 2021-03-13 NOTE — Progress Notes (Signed)
Progress Note - Vaginal Delivery  Janet Thompson is a 30 y.o. G3P2002 now PP day 1 s/p Vaginal, Spontaneous .   Subjective:  The patient reports no complaints, up ad lib, voiding, and tolerating PO   Objective:  Vital signs in last 24 hours: Temp:  [98 F (36.7 C)-98.5 F (36.9 C)] 98 F (36.7 C) (09/14 0348) Pulse Rate:  [82-117] 87 (09/14 0348) Resp:  [16-18] 16 (09/14 0348) BP: (97-135)/(60-86) 106/67 (09/14 0348) SpO2:  [94 %-100 %] 99 % (09/14 0348) Weight:  [78 kg-78.3 kg] 78 kg (09/13 1127)  Physical Exam:  General: alert, cooperative, appears stated age, and fatigued Lochia: appropriate Uterine Fundus: firm @ u-1    Data Review Recent Labs    03/12/21 1340 03/13/21 0439  HGB 9.8* 8.8*  HCT 29.7* 26.0*    Assessment/Plan: Active Problems:   Labor and delivery, indication for care   Plan for discharge tomorrow Bottle feeding  -- Continue routine PP care.    Philip Aspen, CNM  03/13/2021 7:54 AM

## 2021-03-13 NOTE — Anesthesia Postprocedure Evaluation (Signed)
Anesthesia Post Note  Patient: Janet Thompson  Procedure(s) Performed: AN AD [place]  Patient location during evaluation: Mother Baby Anesthesia Type: Epidural Level of consciousness: awake and alert and oriented Pain management: pain level controlled Vital Signs Assessment: post-procedure vital signs reviewed and stable Respiratory status: respiratory function stable Cardiovascular status: stable Postop Assessment: no headache, no backache, patient able to bend at knees, no apparent nausea or vomiting, able to ambulate and adequate PO intake Anesthetic complications: no   No notable events documented.   Last Vitals:  Vitals:   03/13/21 0045 03/13/21 0348  BP: 97/67 106/67  Pulse: 82 87  Resp: 16 16  Temp: 36.8 C 36.7 C  SpO2: 100% 99%    Last Pain:  Vitals:   03/13/21 0348  TempSrc: Oral  PainSc:                  [name]

## 2021-03-14 LAB — MEASLES/MUMPS/RUBELLA IMMUNITY
Mumps IgG: 35.7 AU/mL (ref >10.9)
Rubella: <0.9 index — ABNORMAL LOW (ref >0.99)
Rubeola IgG: 22.4 AU/mL (ref >16.4)

## 2021-03-14 MED ORDER — NORETHINDRONE 0.35 MG PO TABS: 1.0000 | ORAL_TABLET | Freq: Every day | ORAL | 11 refills | Status: DC

## 2021-03-14 MED ORDER — FERROUS SULFATE 325 (65 FE) MG PO TABS: 325.0000 mg | ORAL_TABLET | Freq: Two times a day (BID) | ORAL | 3 refills | Status: DC

## 2021-03-14 MED ORDER — MEASLES, MUMPS & RUBELLA VAC IJ SOLR
0.5000 mL | Freq: Once | INTRAMUSCULAR | Status: AC
  Administered 2021-03-14: 0.5 mL via SUBCUTANEOUS
  Filled 2021-03-14: qty 0.5

## 2021-03-14 NOTE — Final Progress Note (Signed)
Discharge Day SOAP Note:   Progress Note - Vaginal Delivery   Janet Thompson is a 30 y.o. G3P2002 now PP day 2 s/p Vaginal, Spontaneous . Delivery was uncomplicated   Subjective   The patient has the following complaints: has no unusual complaints  Pain is controlled with current medications.   Patient is urinating without difficulty.  She is ambulating well.       Objective   Vital signs: BP 104/66   Pulse 84   Temp 98.8 F (37.1 C) (Oral)   Resp 18   Ht '5\' 8"'$  (1.727 m)   Wt 78 kg   LMP 06/13/2020 (Approximate)   SpO2 99%   BMI 26.15 kg/m    Physical Exam: Gen: NAD Fundus Fundal Tone: Firm @ u-2  Lochia Amount: Small                      Data Review Labs: Recent Labs       Lab Results  Component Value Date    WBC 11.0 (H) 03/13/2021    HGB 8.8 (L) 03/13/2021    HCT 26.0 (L) 03/13/2021    MCV 82.8 03/13/2021    PLT 154 03/13/2021      CBC Latest Ref Rng & Units 03/13/2021 03/12/2021 02/22/2021  WBC 4.0 - 10.5 K/uL 11.0(H) 11.4(H) 8.7  Hemoglobin 12.0 - 15.0 g/dL 8.8(L) 9.8(L) 10.7(L)  Hematocrit 36.0 - 46.0 % 26.0(L) 29.7(L) 32.6(L)  Platelets 150 - 400 K/uL 154 174 208    A POS   Edinburgh Score: Edinburgh Postnatal Depression Scale Screening Tool 03/13/2021  I have been able to laugh and see the funny side of things. 0  I have looked forward with enjoyment to things. 0  I have blamed myself unnecessarily when things went wrong. 0  I have been anxious or worried for no good reason. 1  I have felt scared or panicky for no good reason. 0  Things have been getting on top of me. 1  I have been so unhappy that I have had difficulty sleeping. 0  I have felt sad or miserable. 0  I have been so unhappy that I have been crying. 0  The thought of harming myself has occurred to me. 0  Edinburgh Postnatal Depression Scale Total 2      Assessment/Plan   Active Problems:   Labor and delivery, indication for care     Plan for discharge today.    Discharge Instructions: Per After Visit Summary. Activity: Advance as tolerated. Pelvic rest for 6 weeks.  Also refer to After Visit Summary Diet: Regular Medications: Allergies as of 03/14/2021   No Known Allergies         Medication List       STOP taking these medications     butalbital-acetaminophen-caffeine 50-325-40 MG tablet Commonly known as: FIORICET           TAKE these medications     ferrous sulfate 325 (65 FE) MG tablet Take 1 tablet (325 mg total) by mouth 2 (two) times daily with a meal.    norethindrone 0.35 MG tablet Commonly known as: MICRONOR Take 1 tablet (0.35 mg total) by mouth daily. Start at 4 weeks postpartum    PRENATAL 1 PO Take by mouth.           Outpatient follow up:  Postpartum contraception: PT partner had vasectomy, waiting for follow up clarence . She will use POP until he gets clearance. Has history  of migraines needs progestin only method.    Discharged Condition: good   Discharged to: home   Newborn Data: Disposition:home with mother   Apgars: APGAR (1 MIN): 8   APGAR (5 MINS): 9   APGAR (10 MINS):     Baby Feeding: Bottle     Philip Aspen, CNM  03/14/2021 8:05 AM

## 2021-03-14 NOTE — Discharge Summary (Signed)
Patient Name: Janet Thompson DOB: 06-07-1991 MRN: [id number]                            Discharge Summary  Date of Admission: 03/12/2021 Date of Discharge: 03/14/2021 Delivering Provider: Philip Aspen   Admitting Diagnosis: Labor and delivery, indication for care [O75.9] at 70w1dSecondary diagnosis:  Active Problems:   Labor and delivery, indication for care   Mode of Delivery: normal spontaneous vaginal delivery              Discharge diagnosis: Term Pregnancy Delivered      Intrapartum Procedures: epidural   Post partum procedures:  none  Complications: none                     Discharge Day SOAP Note:  Progress Note - Vaginal Delivery  Janet Guminais a 30y.o. G3P2002 now PP day 2 s/p Vaginal, Spontaneous . Delivery was uncomplicated  Subjective  The patient has the following complaints: has no unusual complaints  Pain is controlled with current medications.   Patient is urinating without difficulty.  She is ambulating well.     Objective  Vital signs: BP 104/66   Pulse 84   Temp 98.8 F (37.1 C) (Oral)   Resp 18   Ht '5\' 8"'$  (1.727 m)   Wt 78 kg   LMP 06/13/2020 (Approximate)   SpO2 99%   BMI 26.15 kg/m   Physical Exam: Gen: NAD Fundus Fundal Tone: Firm @ u-2  Lochia Amount: Small        Data Review Labs: Lab Results  Component Value Date   WBC 11.0 (H) 03/13/2021   HGB 8.8 (L) 03/13/2021   HCT 26.0 (L) 03/13/2021   MCV 82.8 03/13/2021   PLT 154 03/13/2021   CBC Latest Ref Rng & Units 03/13/2021 03/12/2021 02/22/2021  WBC 4.0 - 10.5 K/uL 11.0(H) 11.4(H) 8.7  Hemoglobin 12.0 - 15.0 g/dL 8.8(L) 9.8(L) 10.7(L)  Hematocrit 36.0 - 46.0 % 26.0(L) 29.7(L) 32.6(L)  Platelets 150 - 400 K/uL 154 174 208   A POS  Edinburgh Score: Edinburgh Postnatal Depression Scale Screening Tool 03/13/2021  I have been able to laugh and see the funny side of things. 0  I have looked forward with enjoyment to things. 0  I have blamed myself  unnecessarily when things went wrong. 0  I have been anxious or worried for no good reason. 1  I have felt scared or panicky for no good reason. 0  Things have been getting on top of me. 1  I have been so unhappy that I have had difficulty sleeping. 0  I have felt sad or miserable. 0  I have been so unhappy that I have been crying. 0  The thought of harming myself has occurred to me. 0  Edinburgh Postnatal Depression Scale Total 2    Assessment/Plan  Active Problems:   Labor and delivery, indication for care    Plan for discharge today.  Discharge Instructions: Per After Visit Summary. Activity: Advance as tolerated. Pelvic rest for 6 weeks.  Also refer to After Visit Summary Diet: Regular Medications: Allergies as of 03/14/2021   No Known Allergies      Medication List     STOP taking these medications    butalbital-acetaminophen-caffeine 50-325-40 MG tablet Commonly known as: FIORICET       TAKE these medications    ferrous sulfate  325 (65 FE) MG tablet Take 1 tablet (325 mg total) by mouth 2 (two) times daily with a meal.   norethindrone 0.35 MG tablet Commonly known as: MICRONOR Take 1 tablet (0.35 mg total) by mouth daily. Start at 4 weeks postpartum   PRENATAL 1 PO Take by mouth.       Outpatient follow up:  Postpartum contraception: PT partner had vasectomy, waiting for follow up clarence . She will use POP until he gets clearance. Has history of migraines needs progestin only method.   Discharged Condition: good  Discharged to: home  Newborn Data: Disposition:home with mother  Apgars: APGAR (1 MIN): 8   APGAR (5 MINS): 9   APGAR (10 MINS):    Baby Feeding: Bottle    Philip Aspen, CNM  03/14/2021 8:05 AM

## 2021-03-14 NOTE — Progress Notes (Signed)
Pt discharged with infant. Discharge instructions, prescriptions, and follow up appointments given to and reviewed with patient. Pt verbalized understanding. Escorted out by auxillary.  

## 2021-03-20 ENCOUNTER — Inpatient Hospital Stay: Admit: 2021-03-20 | Payer: Self-pay

## 2021-04-01 ENCOUNTER — Ambulatory Visit (INDEPENDENT_AMBULATORY_CARE_PROVIDER_SITE_OTHER): Payer: No Typology Code available for payment source | Admitting: Certified Nurse Midwife

## 2021-04-01 ENCOUNTER — Encounter: Payer: Self-pay | Admitting: Certified Nurse Midwife

## 2021-04-01 ENCOUNTER — Other Ambulatory Visit: Payer: Self-pay

## 2021-04-01 DIAGNOSIS — Z1331 Encounter for screening for depression: Secondary | ICD-10-CM

## 2021-04-01 NOTE — Patient Instructions (Signed)
Postpartum Care After Vaginal Delivery The following information offers guidance about how to care for yourself from the time you deliver your baby to 6-12 weeks after delivery (postpartum period). If you have problems or questions, contact your health care provider for more specific instructions. Follow these instructions at home: Vaginal bleeding It is normal to have vaginal bleeding (lochia) after delivery. Wear a sanitary pad for bleeding and discharge. During the first week after delivery, the amount and appearance of lochia is often similar to a menstrual period. Over the next few weeks, it will gradually decrease to a dry, yellow-brown discharge. For most women, lochia stops completely by 4-6 weeks after delivery, but can vary. Change your sanitary pads frequently. Watch for any changes in your flow, such as: A sudden increase in volume. A change in color. Large blood clots. If you pass a blood clot from your vagina, save it and call your health care provider. Do not flush blood clots down the toilet before talking with your health care provider. Do not use tampons or douches until your health care provider approves. If you are not breastfeeding, your period should return 6-8 weeks after delivery. If you are feeding your baby breast milk only, your period may not return until you stop breastfeeding. Perineal care  Keep the area between the vagina and the anus (perineum) clean and dry. Use medicated pads and pain-relieving sprays and creams as directed. If you had a surgical cut in the perineum (episiotomy) or a tear, check the area for signs of infection until you are healed. Check for: More redness, swelling, or pain. Fluid or blood coming from the cut or tear. Warmth. Pus or a bad smell. You may be given a squirt bottle to use instead of wiping to clean the perineum area after you use the bathroom. Pat the area gently to dry it. To relieve pain caused by an episiotomy, a tear, or  swollen veins in the anus (hemorrhoids), take a warm sitz bath 2-3 times a day. In a sitz bath, the warm water should only come up to your hips and cover your buttocks. Breast care In the first few days after delivery, your breasts may feel heavy, full, and uncomfortable (breast engorgement). Milk may also leak from your breasts. Ask your health care provider about ways to help relieve the discomfort. If you are breastfeeding: Wear a bra that supports your breasts and fits well. Use breast pads to absorb milk that leaks. Keep your nipples clean and dry. Apply creams and ointments as told. You may have uterine contractions every time you breastfeed for up to several weeks after delivery. This helps your uterus return to its normal size. If you have any problems with breastfeeding, notify your health care provider or lactation consultant. If you are not breastfeeding: Avoid touching your breasts. Do not squeeze out (express) milk. Doing this can make your breasts produce more milk. Wear a good-fitting bra and use cold packs to help with swelling. Intimacy and sexuality Ask your health care provider when you can engage in sexual activity. This may depend upon: Your risk of infection. How fast you are healing. Your comfort and desire to engage in sexual activity. You are able to get pregnant after delivery, even if you have not had your period. Talk with your health care provider about methods of birth control (contraception) or family planning if you desire future pregnancies. Medicines Take over-the-counter and prescription medicines only as told by your health care provider. Take an  over-the-counter stool softener to help ease bowel movements as told by your health care provider. If you were prescribed an antibiotic medicine, take it as told by your health care provider. Do not stop taking the antibiotic even if you start to feel better. Review all previous and current prescriptions to check for  possible transfer into breast milk. Activity Gradually return to your normal activities as told by your health care provider. Rest as much as possible. Nap while your baby is sleeping. Eating and drinking  Drink enough fluid to keep your urine pale yellow. To help prevent or relieve constipation, eat high-fiber foods every day. Choose healthy eating to support breastfeeding or weight loss goals. Take your prenatal vitamins until your health care provider tells you to stop. General tips/recommendations Do not use any products that contain nicotine or tobacco. These products include cigarettes, chewing tobacco, and vaping devices, such as e-cigarettes. If you need help quitting, ask your health care provider. Do not drink alcohol, especially if you are breastfeeding. Do not take medications or drugs that are not prescribed to you, especially if you are breastfeeding. Visit your health care provider for a postpartum checkup within the first 3-6 weeks after delivery. Complete a comprehensive postpartum visit no later than 12 weeks after delivery. Keep all follow-up visits for you and your baby. Contact a health care provider if: You feel unusually sad or worried. Your breasts become red, painful, or hard. You have a fever or other signs of an infection. You have bleeding that is soaking through one pad an hour or you have blood clots. You have a severe headache that doesn't go away or you have vision changes. You have nausea and vomiting and are unable to eat or drink anything for 24 hours. Get help right away if: You have chest pain or difficulty breathing. You have sudden, severe leg pain. You faint or have a seizure. You have thoughts about hurting yourself or your baby. If you ever feel like you may hurt yourself or others, or have thoughts about taking your own life, get help right away. Go to your nearest emergency department or: Call your local emergency services (911 in the  [place] The National Suicide Prevention Lifeline at (731) 405-0507. This suicide crisis helpline is open 24 hours a day. Text the Crisis Text Line at 9856151592 (in the [place] Summary The period of time after you deliver your newborn up to 6-12 weeks after delivery is called the postpartum period. Keep all follow-up visits for you and your baby. Review all previous and current prescriptions to check for possible transfer into breast milk. Contact a health care provider if you feel unusually sad or worried during the postpartum period. This information is not intended to replace advice given to you by your health care provider. Make sure you discuss any questions you have with your health care provider. Document Revised: 03/01/2020 Document Reviewed: 03/01/2020 Elsevier Patient Education  2022 [employer].

## 2021-04-01 NOTE — Progress Notes (Signed)
Virtual Visit via Telephone Note  I connected with Janet Thompson on 04/01/21 at 11:45 AM EDT by telephone and verified that I am speaking with the correct person using two identifiers.  Location: Patient: at home Provider: office   I discussed the limitations, risks, security and privacy concerns of performing an evaluation and management service by telephone and the availability of in person appointments. I also discussed with the patient that there may be a patient responsible charge related to this service. The patient expressed understanding and agreed to proceed.   History of Present Illness: G3P3 SVE 03/12/21 for depression screening today   Observations/Objective: Pt states she is doing well postpartum she has minimal discomfort . Her bleeding has declined overall . She is bottle feeding and pumping breast milk without difficlutly. She feels like her mood is good.   GAD 7 : Generalized Anxiety Score 04/01/2021  Nervous, Anxious, on Edge 0  Control/stop worrying 0  Worry too much - different things 1  Trouble relaxing 0  Restless 0  Easily annoyed or irritable 1  Afraid - awful might happen 0  Total GAD 7 Score 2    PHQ9 SCORE ONLY 04/01/2021 07/30/2020 12/15/2018  PHQ-9 Total Score 1 0 0     Assessment and Plan: Negative depression screen , doing well postpartum   Follow Up Instructions: As scheduled in office 4 wks.    I discussed the assessment and treatment plan with the patient. The patient was provided an opportunity to ask questions and all were answered. The patient agreed with the plan and demonstrated an understanding of the instructions.   The patient was advised to call back or seek an in-person evaluation if the symptoms worsen or if the condition fails to improve as anticipated.  I provided 7 minutes of non-face-to-face time during this encounter.   Philip Aspen, CNM

## 2021-04-08 ENCOUNTER — Ambulatory Visit: Payer: No Typology Code available for payment source | Admitting: Family Medicine

## 2021-04-15 ENCOUNTER — Other Ambulatory Visit: Payer: Self-pay

## 2021-04-15 ENCOUNTER — Ambulatory Visit (INDEPENDENT_AMBULATORY_CARE_PROVIDER_SITE_OTHER): Payer: No Typology Code available for payment source | Admitting: Family Medicine

## 2021-04-15 ENCOUNTER — Encounter: Payer: Self-pay | Admitting: Family Medicine

## 2021-04-15 VITALS — BP 118/82 | HR 92 | Temp 98.6°F | Ht 68.0 in | Wt 159.5 lb

## 2021-04-15 DIAGNOSIS — G43809 Other migraine, not intractable, without status migrainosus: Secondary | ICD-10-CM | POA: Diagnosis not present

## 2021-04-15 MED ORDER — SUMATRIPTAN SUCCINATE 100 MG PO TABS: 100.0000 mg | ORAL_TABLET | ORAL | 5 refills | Status: DC | PRN

## 2021-04-15 MED ORDER — RIZATRIPTAN BENZOATE 10 MG PO TABS: 10.0000 mg | ORAL_TABLET | ORAL | 0 refills | Status: DC | PRN

## 2021-04-15 MED ORDER — AIMOVIG 70 MG/ML ~~LOC~~ SOAJ: 70.0000 mg | SUBCUTANEOUS | 3 refills | Status: DC

## 2021-04-15 MED ORDER — VENLAFAXINE HCL ER 37.5 MG PO CP24: 37.5000 mg | ORAL_CAPSULE | Freq: Every day | ORAL | 1 refills | Status: DC

## 2021-04-15 NOTE — Patient Instructions (Addendum)
*  ok to restart effexor at 37.5mg  and if you are tolerating after a couple of weeks then increase to 2 daily (75mg  daily)  *If Aimovig is covered; I would consider just trying this and not the effexor. If we have to go through prior auth/paperwork, then go ahead and start the effexor.    *try the maxalt for migraines when you get one; if that doesn't work let me know. I printed the imitrex so you can have a back up if needed! But we have a few options.

## 2021-04-15 NOTE — Progress Notes (Signed)
Janet Thompson DOB: March 09, 1991 Encounter date: 04/15/2021  This is a 30 y.o. female who presents with Chief Complaint  Patient presents with   Migraine    Patient requests to begin taking Effexor again for migraines, previously discontinued during pregnancy (delivered September 13th, 2022)    History of present illness: Delivered 03/12/21  Really felt that effexor helped her before with migraines. In last couple of months prior to delivery was getting headaches again. Has had a couple migraines in last couple of weeks. Had old imitrex rx and took this. Headache felt really bad. Wasn't sure of triggers before, but not sleeping as much with 1 mo old. Imitrex did work to get rid of headache. Just feels a little weird afterward. Prior to medication she just had baseline headache symptoms. She feels like she is going back to baseline headache.   Parents/in laws rotate watching kids.   She is not breast feeding now.   Does take nightly walk. Regular activity with other boys.   Body just doesn't feel great when she takes it.   No Known Allergies Current Meds  Medication Sig   ferrous sulfate 325 (65 FE) MG tablet Take 1 tablet (325 mg total) by mouth 2 (two) times daily with a meal.   norethindrone (MICRONOR) 0.35 MG tablet Take 1 tablet (0.35 mg total) by mouth daily. Start at 4 weeks postpartum   [DISCONTINUED] Prenatal MV-Min-Fe Fum-FA-DHA (PRENATAL 1 PO) Take by mouth.    Review of Systems  Constitutional:  Negative for chills, fatigue and fever.  Respiratory:  Negative for cough, chest tightness, shortness of breath and wheezing.   Cardiovascular:  Negative for chest pain, palpitations and leg swelling.  Neurological:  Positive for headaches (has had a handful in last couple of weeks).   Objective:  BP 118/82 (BP Location: Right Arm, Patient Position: Sitting, Cuff Size: Normal)   Pulse 92   Temp 98.6 F (37 C) (Oral)   Ht 5\' 8"  (1.727 m)   Wt 159 lb 8 oz (72.3 kg)   BMI  24.25 kg/m   Weight: 159 lb 8 oz (72.3 kg)   BP Readings from Last 3 Encounters:  04/15/21 118/82  03/14/21 101/72  03/12/21 120/75   Wt Readings from Last 3 Encounters:  04/15/21 159 lb 8 oz (72.3 kg)  03/12/21 172 lb (78 kg)  03/12/21 172 lb 9.6 oz (78.3 kg)    Physical Exam Constitutional:      General: She is not in acute distress.    Appearance: She is well-developed.  Eyes:     General: Lids are normal.     Pupils: Pupils are equal, round, and reactive to light.  Cardiovascular:     Rate and Rhythm: Normal rate and regular rhythm.     Heart sounds: Normal heart sounds. No murmur heard.   No friction rub.  Pulmonary:     Effort: Pulmonary effort is normal. No respiratory distress.     Breath sounds: Normal breath sounds. No wheezing or rales.  Musculoskeletal:     Right lower leg: No edema.     Left lower leg: No edema.  Neurological:     Mental Status: She is alert and oriented to person, place, and time.  Psychiatric:        Behavior: Behavior normal.    Assessment/Plan  1. Other migraine without status migrainosus, not intractable She did fairly well on the Effexor before at 75 mg dose for migraine prevention.  This helped lift the  daily headache for her and to help control and prevent regular migraines.  We discussed trying medications always slowly meant for migraine prevention, like Aimovig once monthly.  She is willing to try this.  I have sent into insurance, uncertain about cost for her.  In the meanwhile, I did also send Effexor for her to restart in case we are unable to get her the Mobic.  She has some side effects with the Imitrex when she takes it, we are going to try Maxalt instead when she gets a migraine to see if she tolerates this better.  Would certainly consider trying different medication if she either has side effects or does not get resolution of migraine with new medication.  I did additionally print out a prescription for Imitrex to have on hand  in case the Relpax does not work for her.  This way she will have a tool while we work on prevention.    Return in about 3 months (around 07/16/2021) for Chronic condition visit.     Micheline Rough, MD

## 2021-04-16 ENCOUNTER — Encounter: Payer: Self-pay | Admitting: Family Medicine

## 2021-04-17 ENCOUNTER — Telehealth: Payer: Self-pay

## 2021-04-17 ENCOUNTER — Other Ambulatory Visit (HOSPITAL_COMMUNITY): Payer: Self-pay

## 2021-04-17 MED ORDER — VENLAFAXINE HCL ER 37.5 MG PO CP24
37.5000 mg | ORAL_CAPSULE | Freq: Every day | ORAL | 1 refills | Status: DC
  Filled 2021-04-17: qty 90, 90d supply, fill #0

## 2021-04-17 NOTE — Telephone Encounter (Signed)
Pt call back about her Effexor being sent to  [place].

## 2021-04-17 NOTE — Telephone Encounter (Signed)
Pt call and stated she need

## 2021-04-24 ENCOUNTER — Encounter: Payer: Self-pay | Admitting: Family Medicine

## 2021-04-29 ENCOUNTER — Ambulatory Visit (INDEPENDENT_AMBULATORY_CARE_PROVIDER_SITE_OTHER): Payer: No Typology Code available for payment source | Admitting: Certified Nurse Midwife

## 2021-04-29 ENCOUNTER — Encounter: Payer: Self-pay | Admitting: Certified Nurse Midwife

## 2021-04-29 ENCOUNTER — Other Ambulatory Visit: Payer: Self-pay

## 2021-04-29 NOTE — Patient Instructions (Signed)
Colposcopy Colposcopy is a procedure to examine the lowest part of the uterus (cervix), the vagina, and the area around the vaginal opening (vulva) for abnormalities or signs of disease. This procedure is done using an instrument that makes objects appear larger and provides light. (colposcope). During the procedure, the health care provider may remove a tissue sample to be looked at later under a microscope (biopsy). A biopsy may be done if any unusual cells are found during the colposcopy. You may have a colposcopy if you have: An abnormal Pap smear, also called a Pap test. This screening test is used to check for signs of cancer or infection of the vagina, cervix, and uterus. An HPV (human papillomavirus) test and get a positive result for a type of HPV that puts you at high risk of cancer. Certain conditions or symptoms, such as: A sore, or lesion, on your cervix. Genital warts on your vulva, vagina, or cervix. Pain during sex. Vaginal bleeding, especially after sex. A growth on your cervix (cervical polyp) that needs to be removed. Let your health care provider know about: Any allergies you have, including allergies to medicines, latex, or iodine. All medicines you are taking, including vitamins, herbs, eye drops, creams, and over-the-counter medicines. Any problems you or family members have had with anesthetic medicines. Any blood disorders you have. Any surgeries you have had. Any medical conditions you have, such as pelvic inflammatory disease (PID) or endometrial disorder. The pattern of your menstrual cycles and the form of birth control (contraception) you use, if any. Your medical history, including any history of fainting often or of cervical treatment. Whether you are pregnant or may be pregnant. What are the risks? Generally, this is a safe procedure. However, problems may occur, including: Infection. Symptoms of infection may include fever, bad-smelling vaginal discharge, or  pelvic pain. Allergic reactions to medicines. Damage to nearby structures or organs. Fainting. This is rare. What happens before the procedure? Eating and drinking restrictions Follow instructions from your health care provider about eating or drinking restrictions. You will likely need to eat a regular diet the day of the procedure and not skip any meals. Tests You may have an exam or testing. A pregnancy test will be done the day of the procedure. You may have a blood or urine sample taken. General instructions Ask your health care provider about: Changing or stopping your regular medicines. This is especially important if you are taking diabetes medicines or blood thinners. Taking medicines such as aspirin and ibuprofen. These medicines can thin your blood. Do not take these medicines unless your health care provider tells you to take them. Your health care provider will likely tell you to avoid taking aspirin, or medicine that contains aspirin, for 7 days before the procedure. Taking over-the-counter medicines, vitamins, herbs, and supplements. Tell your health care provider if you have your menstrual period now or will have it at the time of your procedure. A colposcopy is not normally done during your menstrual period. If you use contraception, continue to use it before your procedure. For 24 hours before the procedure: Do not use douche products or tampons. Do not use medicines, creams, or suppositories in the vagina. Do not have sex. Ask your health care provider what steps will be taken to prevent infection. What happens during the procedure? You will lie down on your back, with your feet in foot rests (stirrups). A tool called a speculum will be warmed and will have oil or gel put on  it (will be lubricated). The speculum will then be inserted into your vagina. This will be used to hold apart the walls of your vagina so your health care provider can see your cervix and the inside of  your vagina. A cotton swab will be used to place a small amount of a liquid (solution) on the areas to be examined. This solution makes it easier to see abnormal cells. You may feel a slight burning during this part. The colposcope will be used to scan the cervix with a bright white light. The colposcope will be held near your vulva and will make your vulva, vagina, and cervix look bigger so they can be seen better. If a biopsy is needed: You may be given a medicine to numb the area (local anesthetic). Surgical tools will be used to remove mucus and cells through your vagina. You may feel mild pain while the tissue sample is removed. Bleeding may occur. A solution may be used to stop the bleeding. If a biopsy is needed from the inside of the cervix, a different procedure called endocervical curettage (ECC) may be done. During this procedure, a curved tool called a curette will be used to scrape cells from your cervix or the top of your cervix (endocervix). Any abnormalities that are found will be recorded. The procedure may vary among health care providers and hospitals. What happens after the procedure? You will lie down and rest for a few minutes. You may be offered juice or cookies. Your blood pressure, heart rate, breathing rate, and blood oxygen level will be monitored until you leave the hospital or clinic. You may have some cramping in your abdomen. This should go away after a few minutes. It is up to you to get the results of your procedure. Ask your health care provider, or the department that is doing the procedure, when your results will be ready. Summary Colposcopy is a procedure to examine the lowest part of the uterus (cervix), the vagina, and the area around the vaginal opening (vulva) for abnormalities or signs of disease. A biopsy may be done as part of the procedure. After the procedure, you will remain lying down and will rest for a few minutes. You may have some cramping in  your abdomen. This should go away after a few minutes. This information is not intended to replace advice given to you by your health care provider. Make sure you discuss any questions you have with your health care provider. Document Revised: 06/15/2019 Document Reviewed: 06/15/2019 Elsevier Patient Education  2022 [employer].

## 2021-04-29 NOTE — Progress Notes (Signed)
Subjective:    Janet Thompson is a 30 y.o. G30P2002 Caucasian female who presents for a postpartum visit. She is 6 weeks postpartum following a spontaneous vaginal delivery at 38.6 gestational weeks. Anesthesia: epidural. I have fully reviewed the prenatal and intrapartum course. Postpartum course has been normal. Baby's course has been normal. Baby is feeding by  bottle . Bleeding  stopped then got her period  . Bowel function is normal. Bladder function is normal. Patient is not sexually active. Last sexual activity: prior to delivery. Contraception method is vasectomy. Postpartum depression screening: negative. Score negative.  Last pap 06/20/20 and was abnormal ASCUS + high risk HPV.Colposcopy done 08/16/20  The following portions of the patient's history were reviewed and updated as appropriate: allergies, current medications, past medical history, past surgical history and problem list.  Review of Systems Pertinent items are noted in HPI.   Vitals:   04/29/21 1348  BP: 123/80  Pulse: 72  Weight: 158 lb 8 oz (71.9 kg)  Height: 5\' 8"  (1.727 m)   No LMP recorded.  Objective:   General:  alert, cooperative and no distress   Breasts:  deferred, no complaints  Lungs: clear to auscultation bilaterally  Heart:  regular rate and rhythm  Abdomen: soft, nontender   Vulva: normal  Vagina: normal vagina  Cervix:  closed  Corpus: Well-involuted  Adnexa:  Non-palpable  Rectal Exam: no hemorrhoids        Assessment:   Postpartum exam 6 wks s/p svd Bottle feeding Depression screening Contraception counseling   Plan:  : vasectomy Follow up in: 1 month for colposcopy with MD or earlier if needed  Philip Aspen, CNM

## 2021-05-28 ENCOUNTER — Encounter: Payer: Self-pay | Admitting: Family Medicine

## 2021-05-30 ENCOUNTER — Other Ambulatory Visit: Payer: Self-pay | Admitting: Family Medicine

## 2021-05-30 MED ORDER — VENLAFAXINE HCL ER 75 MG PO CP24: 75.0000 mg | ORAL_CAPSULE | Freq: Every day | ORAL | 1 refills | Status: DC

## 2021-05-30 MED ORDER — VENLAFAXINE HCL ER 75 MG PO CP24
75.0000 mg | ORAL_CAPSULE | Freq: Every day | ORAL | 1 refills | Status: DC
  Filled 2021-05-30 – 2021-06-03 (×3): qty 90, 90d supply, fill #0

## 2021-05-31 ENCOUNTER — Other Ambulatory Visit (HOSPITAL_COMMUNITY): Payer: Self-pay

## 2021-05-31 NOTE — Telephone Encounter (Signed)
Spoke with Ronalee Belts, the pharmacist at [facility] and he stated the Rx will be put back as below.

## 2021-06-03 ENCOUNTER — Other Ambulatory Visit (HOSPITAL_COMMUNITY): Payer: Self-pay

## 2021-06-26 ENCOUNTER — Encounter: Payer: No Typology Code available for payment source | Admitting: Certified Nurse Midwife

## 2021-07-09 ENCOUNTER — Encounter: Payer: No Typology Code available for payment source | Admitting: Obstetrics and Gynecology

## 2021-10-23 ENCOUNTER — Encounter: Payer: Self-pay | Admitting: Certified Nurse Midwife

## 2021-10-23 ENCOUNTER — Other Ambulatory Visit (HOSPITAL_COMMUNITY)
Admission: RE | Admit: 2021-10-23 | Discharge: 2021-10-23 | Disposition: A | Discharge status: Home / Self Care | Payer: No Typology Code available for payment source | Source: Ambulatory Visit | Attending: Certified Nurse Midwife | Admitting: Certified Nurse Midwife

## 2021-10-23 ENCOUNTER — Ambulatory Visit (INDEPENDENT_AMBULATORY_CARE_PROVIDER_SITE_OTHER): Payer: No Typology Code available for payment source | Admitting: Certified Nurse Midwife

## 2021-10-23 VITALS — BP 133/90 | HR 78 | Ht 69.0 in | Wt 161.9 lb

## 2021-10-23 DIAGNOSIS — Z124 Encounter for screening for malignant neoplasm of cervix: Secondary | ICD-10-CM | POA: Diagnosis present

## 2021-10-23 DIAGNOSIS — Z01419 Encounter for gynecological examination (general) (routine) without abnormal findings: Secondary | ICD-10-CM

## 2021-10-23 NOTE — Progress Notes (Signed)
? ? ?GYNECOLOGY ANNUAL PREVENTATIVE CARE ENCOUNTER NOTE ? ?History:    ? Janet Thompson is a 31 y.o. G20P2002 female here for a routine annual gynecologic exam.  Current complaints: none.   Denies abnormal vaginal bleeding, discharge, pelvic pain, problems with intercourse or other gynecologic concerns.  ?  ? ?Social ?Relationship: married  ?Living:spouse and kids ?Work: 2 days a week at hospital  ?Exercise: ?Smoke/Alcohol/drug use: denies use  ? ?Gynecologic History ?No LMP recorded. ?Contraception: vasectomy ?Last Pap: 06/19/20. Results were: ASCUS  + HPV . Colposcopy 08/16/20. Pt states she was supposed to come back and have rpt colpo but canceled that appointment due to being sick.  ?Last mammogram: n/a. ? ?Obstetric History ?OB History  ?Gravida Para Term Preterm AB Living  ?'3 2 2 '$ 0 0 2  ?SAB IAB Ectopic Multiple Live Births  ?0 0 0 0 2  ?  ?# Outcome Date GA Lbr Len/2nd Weight Sex Delivery Anes PTL Lv  ?3 Gravida           ?2 Term 11/04/18 [redacted]w[redacted]d/ 00:31 8 lb 9 oz (3.884 kg) M Vag-Spont EPI N LIV  ?1 Term 06/24/15 335w5d0:50 / 01:39 8 lb 4.6 oz (3.76 kg) M Vag-Spont EPI  LIV  ? ? ?Past Medical History:  ?Diagnosis Date  ? Frequent headaches   ? History of fainting spells of unknown cause   ? Menstrual irregularity   ? Migraines   ? Placenta previa antepartum in second trimester 05/07/2018  ? Positive QuantiFERON-TB Gold test   ? ? ?Past Surgical History:  ?Procedure Laterality Date  ? WISDOM TOOTH EXTRACTION    ? ? ?Current Outpatient Medications on File Prior to Visit  ?Medication Sig Dispense Refill  ? Erenumab-aooe (AIMOVIG) 70 MG/ML SOAJ Inject 70 mg into the skin every 30 (thirty) days. 3 mL 3  ? norethindrone (MICRONOR) 0.35 MG tablet Take 1 tablet (0.35 mg total) by mouth daily. Start at 4 weeks postpartum 30 tablet 11  ? rizatriptan (MAXALT) 10 MG tablet Take 1 tablet (10 mg total) by mouth as needed for migraine. May repeat in 2 hours if needed 10 tablet 0  ? venlafaxine XR (EFFEXOR XR) 75 MG 24 hr  capsule Take 1 capsule (75 mg total) by mouth daily with breakfast. 90 capsule 1  ? ferrous sulfate 325 (65 FE) MG tablet Take 1 tablet (325 mg total) by mouth 2 (two) times daily with a meal. 60 tablet 3  ? SUMAtriptan (IMITREX) 100 MG tablet Take 1 tablet (100 mg total) by mouth every 2 (two) hours as needed for migraine. May repeat in 2 hours if headache persists or recurs. 10 tablet 5  ? ?No current facility-administered medications on file prior to visit.  ? ? ?No Known Allergies ? ?Social History:  reports that she has never smoked. She has never used smokeless tobacco. She reports that she does not currently use alcohol. She reports that she does not use drugs. ? ?Family History  ?Problem Relation Age of Onset  ? Healthy Mother   ?     heavy menses; early hysterectomy  ? High blood pressure Father   ? Alcohol abuse Maternal Grandfather   ?     colon  ? Diabetes Paternal Grandmother   ? Colon cancer Maternal Aunt [id number]? ? ?The following portions of the patient's history were reviewed and updated as appropriate: allergies, current medications, past family history, past medical history, past social history, past surgical history and problem  list. ? ?Review of Systems ?Pertinent items noted in HPI and remainder of comprehensive ROS otherwise negative. ? ?Physical Exam:  ?BP 133/90   Pulse 78   Ht '5\' 9"'$  (1.753 m)   Wt 161 lb 14.4 oz (73.4 kg)   BMI 23.91 kg/m?  ?CONSTITUTIONAL: Well-developed, well-nourished female in no acute distress.  ?HENT:  Normocephalic, atraumatic, External right and left ear normal. Oropharynx is clear and moist ?EYES: Conjunctivae and EOM are normal. Pupils are equal, round, and reactive to light. No scleral icterus.  ?NECK: Normal range of motion, supple, no masses.  Normal thyroid.  ?SKIN: Skin is warm and dry. No rash noted. Not diaphoretic. No erythema. No pallor. ?MUSCULOSKELETAL: Normal range of motion. No tenderness.  No cyanosis, clubbing, or edema.  2+ distal  pulses. ?NEUROLOGIC: Alert and oriented to person, place, and time. Normal reflexes, muscle tone coordination.  ?PSYCHIATRIC: Normal mood and affect. Normal behavior. Normal judgment and thought content. ?CARDIOVASCULAR: Normal heart rate noted, regular rhythm ?RESPIRATORY: Clear to auscultation bilaterally. Effort and breath sounds normal, no problems with respiration noted. ?BREASTS: Symmetric in size. No masses, tenderness, skin changes, nipple drainage, or lymphadenopathy bilaterally.  ?ABDOMEN: Soft, no distention noted.  No tenderness, rebound or guarding.  ?PELVIC: Normal appearing external genitalia and urethral meatus; normal appearing vaginal mucosa and cervix.  No abnormal discharge noted.  Pt at the end of her period some blood presetn. Pap smear obtained.  Normal uterine size, no other palpable masses, no uterine or adnexal tenderness.  . ?  ?Assessment and Plan:  ?  1. Well woman exam with routine gynecological exam ? ?Pap: Will follow up results of pap smear and manage accordingly. ?Mammogram : n/a ?Labs: none ?Refills: none ?Referral: none  ?Routine preventative health maintenance measures emphasized. ?Please refer to After Visit Summary for other counseling recommendations.  ?   ? ?Philip Aspen, CNM ?[facility] ?[facility]   ?

## 2021-10-28 LAB — CYTOLOGY - PAP
Comment: NEGATIVE
Comment: NEGATIVE
Comment: NEGATIVE
Diagnosis: UNDETERMINED — AB
HPV 16: NEGATIVE
HPV 18 / 45: POSITIVE — AB
High risk HPV: POSITIVE — AB

## 2021-10-30 ENCOUNTER — Encounter: Payer: Self-pay | Admitting: Certified Nurse Midwife

## 2021-11-19 NOTE — Progress Notes (Unsigned)
    GYNECOLOGY PROGRESS NOTE  Subjective:    Patient ID: Kenlei Safi, female    DOB: [birth year], 31 y.o.   MRN: [id number]  HPI  Patient is a 31 y.o. G64P2002 female who presents for Colonoscopy last Pap smear positive for high risk HPV and ASCUS :   The following portions of the patient's history were reviewed and updated as appropriate: allergies, current medications, past family history, past medical history, past social history, past surgical history, and problem list.  Review of Systems Pertinent items are noted in HPI.   Objective:   There were no vitals taken for this visit. There is no height or weight on file to calculate BMI. General appearance: alert, cooperative, and no distress Abdomen: {abdominal exam:16834} Pelvic: {pelvic exam:16852::"cervix normal in appearance","external genitalia normal","no adnexal masses or tenderness","no cervical motion tenderness","rectovaginal septum normal","uterus normal size, shape, and consistency","vagina normal without discharge"} Extremities: {extremity exam:5109} Neurologic: {neuro exam:17854}   Assessment:   No diagnosis found.   Plan:   There are no diagnoses linked to this encounter.

## 2021-11-20 ENCOUNTER — Other Ambulatory Visit (HOSPITAL_COMMUNITY)
Admission: RE | Admit: 2021-11-20 | Discharge: 2021-11-20 | Disposition: A | Discharge status: Home / Self Care | Payer: No Typology Code available for payment source | Source: Ambulatory Visit | Attending: Obstetrics and Gynecology | Admitting: Obstetrics and Gynecology

## 2021-11-20 ENCOUNTER — Ambulatory Visit (INDEPENDENT_AMBULATORY_CARE_PROVIDER_SITE_OTHER): Payer: No Typology Code available for payment source | Admitting: Obstetrics and Gynecology

## 2021-11-20 VITALS — BP 130/74 | HR 102 | Resp 16 | Ht 69.0 in | Wt 159.9 lb

## 2021-11-20 DIAGNOSIS — R87810 Cervical high risk human papillomavirus (HPV) DNA test positive: Secondary | ICD-10-CM

## 2021-11-20 DIAGNOSIS — R87610 Atypical squamous cells of undetermined significance on cytologic smear of cervix (ASC-US): Secondary | ICD-10-CM | POA: Insufficient documentation

## 2021-11-20 LAB — POCT URINE PREGNANCY: Preg Test, Ur: NEGATIVE

## 2021-11-20 NOTE — Addendum Note (Signed)
Addended by: Chilton Greathouse on: 11/20/2021 11:46 AM   Modules accepted: Orders

## 2021-11-23 IMAGING — US US OB COMP +14 WK
1 series · 13 of 28 positions shown · non-contrast
Comparison: none

CLINICAL DATA: Second trimester pregnancy for fetal anatomy survey.

EXAM:
OBSTETRICAL ULTRASOUND >14 WKS

[Series 1: us ob comp +14 wk · 0.25mm/px · 105 acquisitions, 13 frames shown]
[im 4/105]
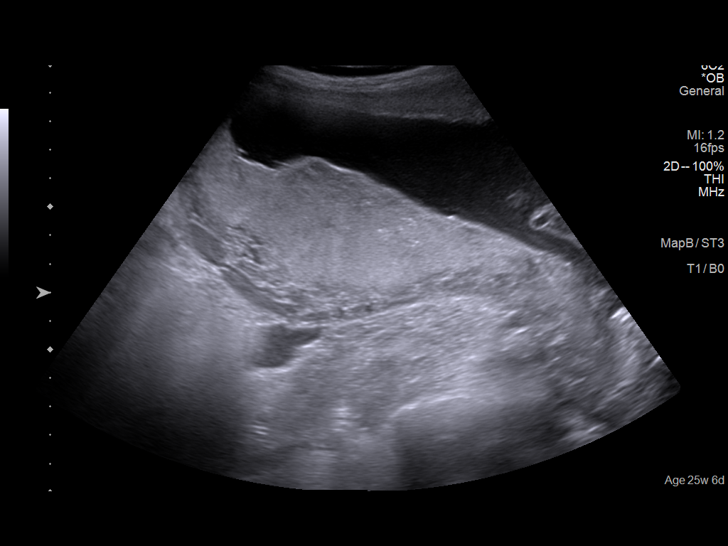
[im 12/105]
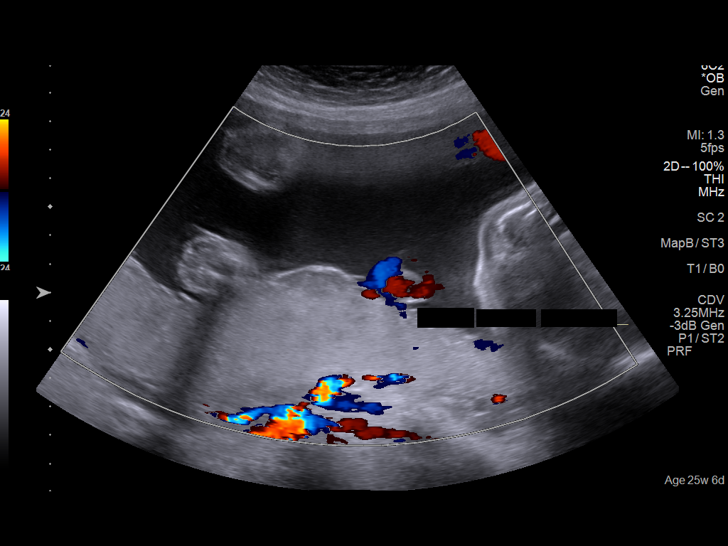
[im 20/105]
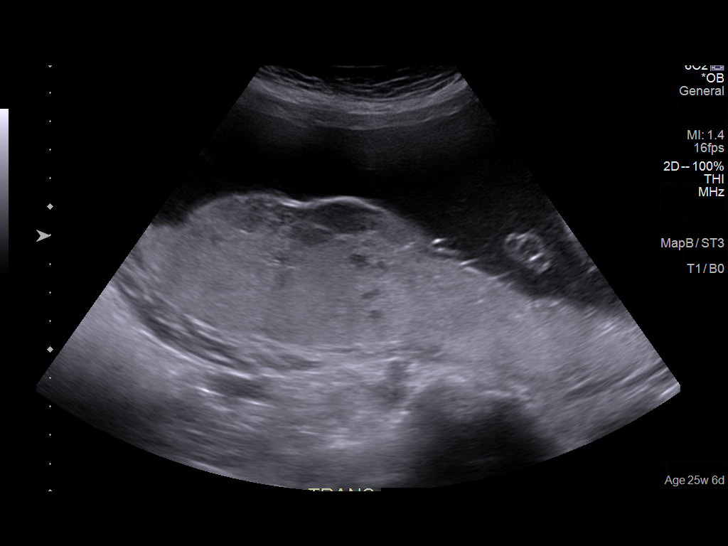
[im 27/105]
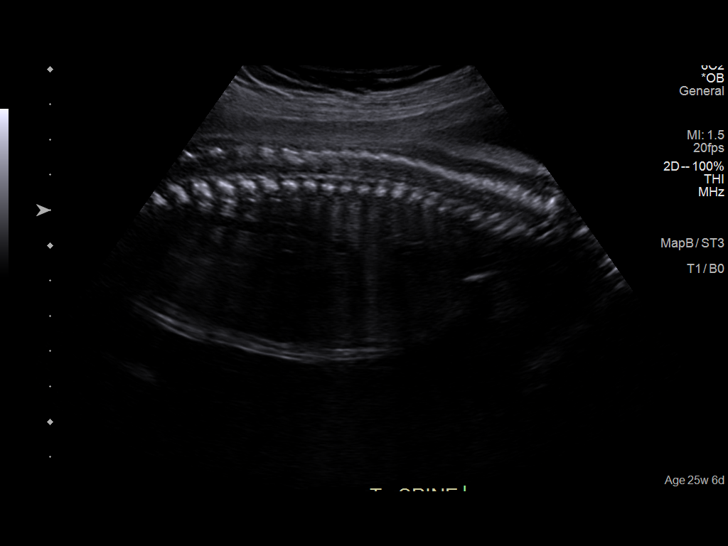
[im 35/105]
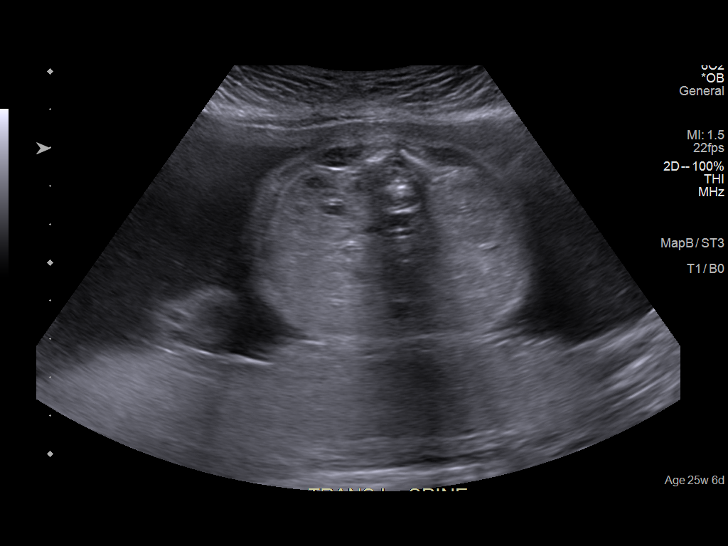
[im 43/105]
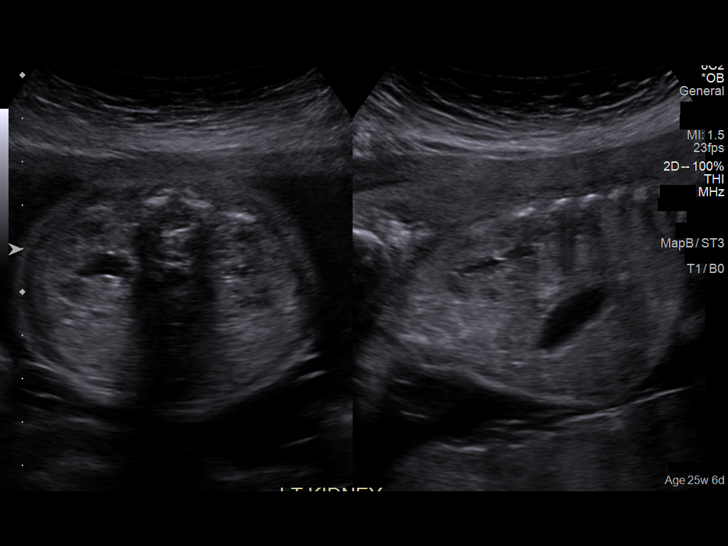
[im 54/105]
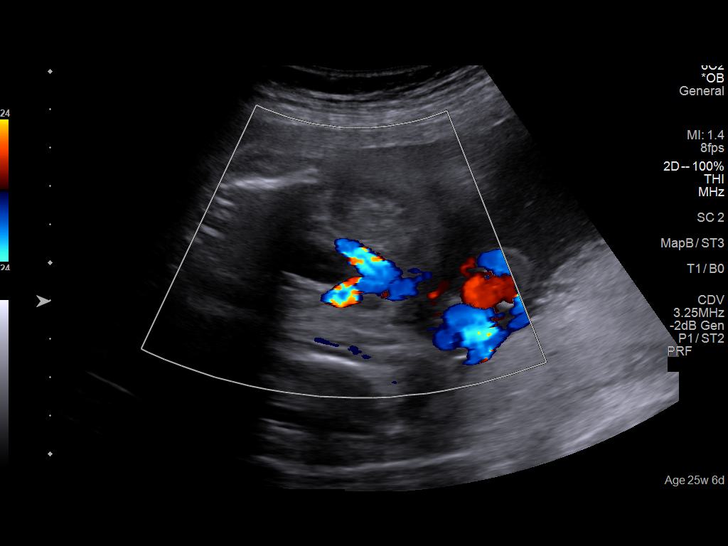
[im 62/105]
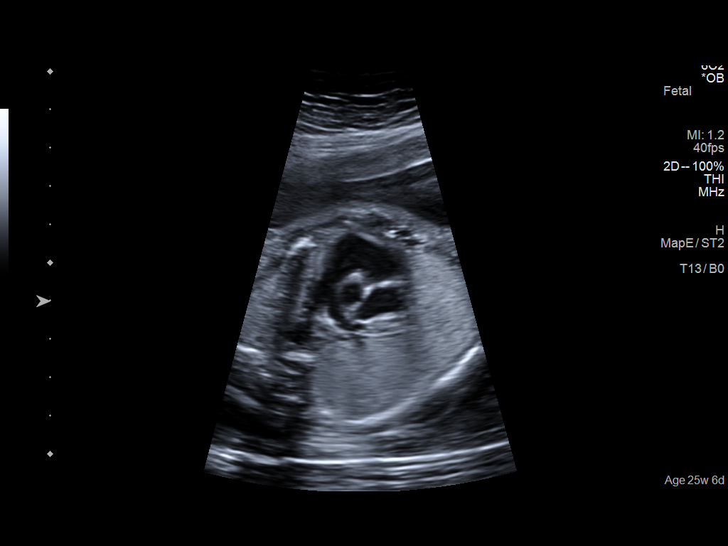
[im 70/105]
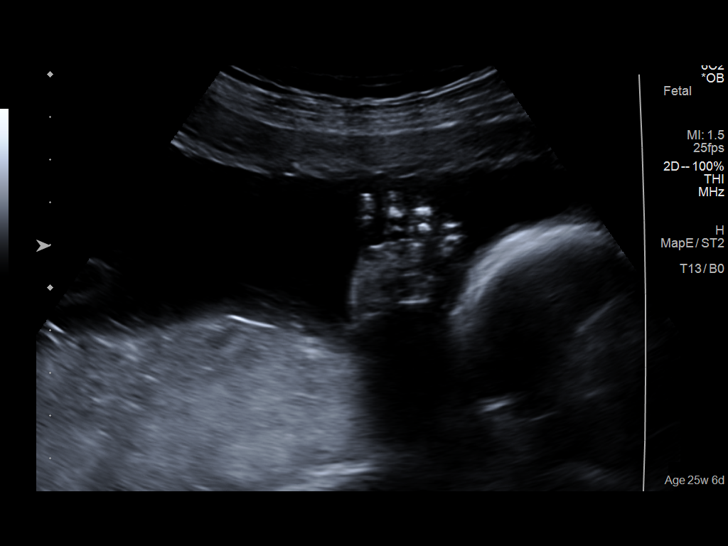
[im 78/105]
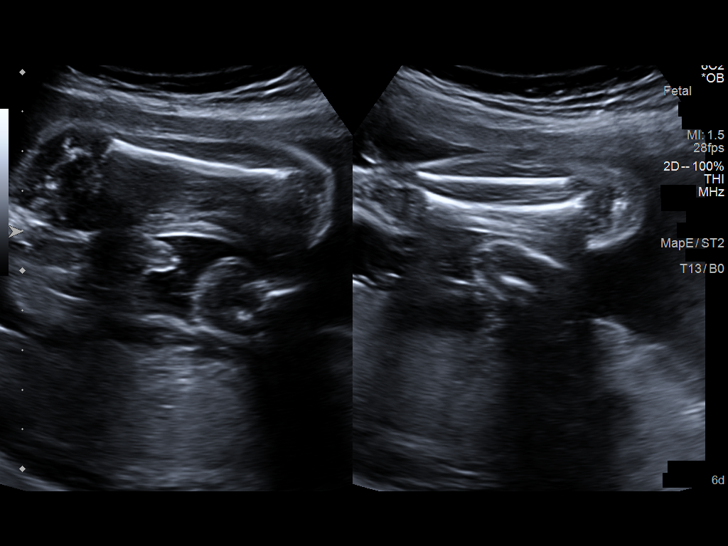
[im 85/105]
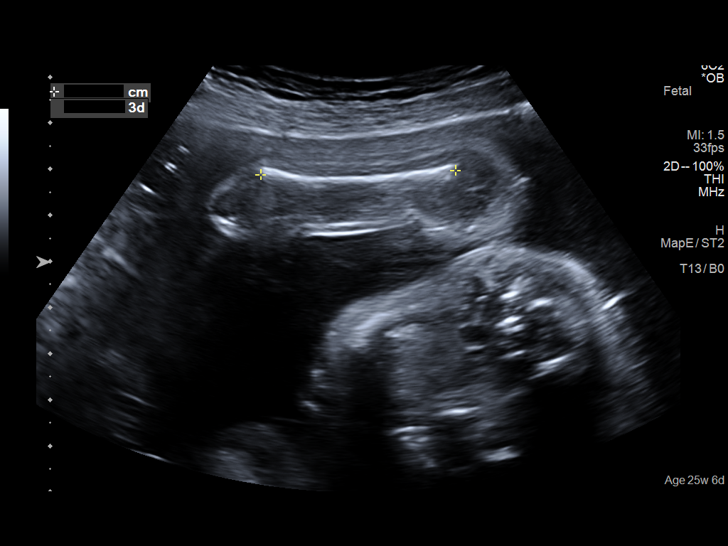
[im 93/105]
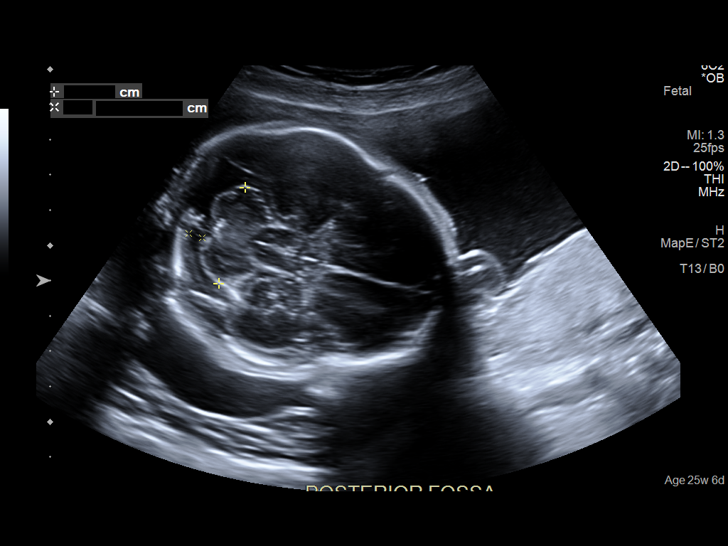
[im 101/105]
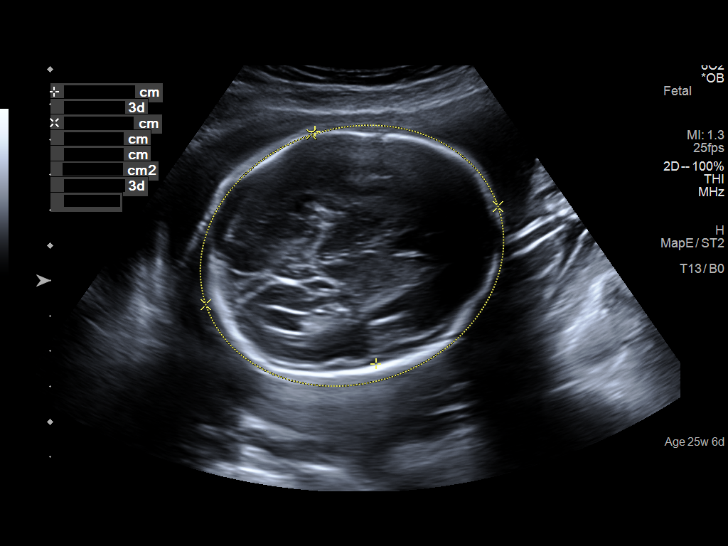

[13 of 28 positions shown; findings below may reference images not displayed]

FINDINGS: Number of Fetuses: 1

Heart Rate:  149 bpm

Movement: Yes

Presentation: Cephalic

Previa: No

Placental Location: Posterior

Amniotic Fluid (Subjective): Within normal limits

Amniotic Fluid (Objective):

Vertical pocket = 4.0cm

FETAL BIOMETRY

BPD: 6.8cm 27w 4d

HC:   25.3cm 27w 3d

AC:   22.0cm 26w 3d

FL:   4.6cm 25w 2d

Current Mean GA: 26w 3d US EDC: 03/15/2021

Assigned GA:  25w 6d Assigned EDC: 03/19/2021

FETAL ANATOMY

Lateral Ventricles: Appears normal

Thalami/CSP: Appears normal

Posterior Fossa:  Appears normal

Nuchal Region: Appears normal   NFT= N/A > 20 WKS

Upper Lip: Appears normal

Spine: Appears normal

4 Chamber Heart on Left: Appears normal

LVOT: Appears normal

RVOT: Appears normal

Stomach on Left: Appears normal

3 Vessel Cord: Appears normal

Cord Insertion site: Appears normal

Kidneys: Normal left kidney; mild right renal pyelectasis measuring
4 mm

Bladder: Appears normal

Extremities: Appears normal

Sex: Male

Maternal Findings:

Cervix: 4.3 cm TA
IMPRESSION: Assigned GA currently 25 weeks 3 days.  Appropriate fetal growth.

Mild right fetal renal pyelectasis measuring 4 mm. No other fetal
abnormality identified. Followup by ultrasound is also recommended
in 6-8 weeks.

## 2021-11-28 DIAGNOSIS — N871 Moderate cervical dysplasia: Secondary | ICD-10-CM

## 2021-11-28 HISTORY — DX: Moderate cervical dysplasia: N87.1

## 2021-12-04 ENCOUNTER — Encounter: Payer: Self-pay | Admitting: Obstetrics and Gynecology

## 2021-12-05 LAB — SURGICAL PATHOLOGY

## 2021-12-07 ENCOUNTER — Encounter: Payer: Self-pay | Admitting: Obstetrics and Gynecology

## 2021-12-11 ENCOUNTER — Other Ambulatory Visit: Payer: Self-pay | Admitting: Obstetrics and Gynecology

## 2021-12-23 ENCOUNTER — Encounter
Admission: RE | Admit: 2021-12-23 | Discharge: 2021-12-23 | Disposition: A | Discharge status: Home / Self Care | Payer: No Typology Code available for payment source | Source: Ambulatory Visit | Attending: Obstetrics and Gynecology | Admitting: Obstetrics and Gynecology

## 2021-12-23 VITALS — Ht 69.0 in | Wt 158.0 lb

## 2021-12-23 DIAGNOSIS — Z01812 Encounter for preprocedural laboratory examination: Secondary | ICD-10-CM

## 2021-12-25 ENCOUNTER — Encounter: Payer: Self-pay | Admitting: Urgent Care

## 2021-12-25 ENCOUNTER — Encounter
Admission: RE | Admit: 2021-12-25 | Discharge: 2021-12-25 | Disposition: A | Discharge status: Home / Self Care | Payer: No Typology Code available for payment source | Source: Ambulatory Visit | Attending: Obstetrics and Gynecology | Admitting: Obstetrics and Gynecology

## 2021-12-25 DIAGNOSIS — Z01812 Encounter for preprocedural laboratory examination: Secondary | ICD-10-CM | POA: Diagnosis present

## 2021-12-25 LAB — CBC
HCT: 38.3 % (ref 36.0–46.0)
Hemoglobin: 12.2 g/dL (ref 12.0–15.0)
MCH: 27.1 pg (ref 26.0–34.0)
MCHC: 31.9 g/dL (ref 30.0–36.0)
MCV: 84.9 fL (ref 80.0–100.0)
Platelets: 249 10*3/uL (ref 150–400)
RBC: 4.51 MIL/uL (ref 3.87–5.11)
RDW: 13.8 % (ref 11.5–15.5)
WBC: 5.6 10*3/uL (ref 4.0–10.5)
nRBC: 0 % (ref 0.0–0.2)

## 2021-12-30 ENCOUNTER — Ambulatory Visit
Admission: RE | Admit: 2021-12-30 | Discharge: 2021-12-30 | Disposition: A | Discharge status: Home / Self Care | Payer: No Typology Code available for payment source | Attending: Obstetrics and Gynecology | Admitting: Obstetrics and Gynecology

## 2021-12-30 ENCOUNTER — Encounter
Admission: RE | Disposition: A | Discharge status: Home / Self Care | Payer: Self-pay | Source: Home / Self Care | Attending: Obstetrics and Gynecology

## 2021-12-30 ENCOUNTER — Encounter: Payer: Self-pay | Admitting: Obstetrics and Gynecology

## 2021-12-30 ENCOUNTER — Ambulatory Visit: Payer: No Typology Code available for payment source | Admitting: Certified Registered"

## 2021-12-30 ENCOUNTER — Other Ambulatory Visit: Payer: Self-pay

## 2021-12-30 DIAGNOSIS — Z01812 Encounter for preprocedural laboratory examination: Secondary | ICD-10-CM

## 2021-12-30 DIAGNOSIS — D060 Carcinoma in situ of endocervix: Secondary | ICD-10-CM | POA: Insufficient documentation

## 2021-12-30 DIAGNOSIS — D069 Carcinoma in situ of cervix, unspecified: Secondary | ICD-10-CM | POA: Diagnosis not present

## 2021-12-30 DIAGNOSIS — Z9889 Other specified postprocedural states: Secondary | ICD-10-CM

## 2021-12-30 HISTORY — PX: LEEP: SHX91

## 2021-12-30 LAB — POCT PREGNANCY, URINE: Preg Test, Ur: NEGATIVE

## 2021-12-30 SURGERY — LEEP (LOOP ELECTROSURGICAL EXCISION PROCEDURE)
Anesthesia: General

## 2021-12-30 MED ORDER — PROPOFOL 10 MG/ML IV BOLUS
INTRAVENOUS | Status: AC
  Filled 2021-12-30: qty 20

## 2021-12-30 MED ORDER — LACTATED RINGERS IV SOLN: INTRAVENOUS | Status: DC

## 2021-12-30 MED ORDER — LIDOCAINE HCL (PF) 2 % IJ SOLN
INTRAMUSCULAR | Status: AC
  Filled 2021-12-30: qty 5

## 2021-12-30 MED ORDER — FERRIC SUBSULFATE 259 MG/GM EX SOLN
CUTANEOUS | Status: AC
  Filled 2021-12-30: qty 8

## 2021-12-30 MED ORDER — IBUPROFEN 800 MG PO TABS: 800.0000 mg | ORAL_TABLET | Freq: Three times a day (TID) | ORAL | 1 refills | Status: DC | PRN

## 2021-12-30 MED ORDER — ONDANSETRON HCL 4 MG/2ML IJ SOLN
INTRAMUSCULAR | Status: AC
  Administered 2021-12-30: 4 mg via INTRAVENOUS
  Filled 2021-12-30: qty 2

## 2021-12-30 MED ORDER — FENTANYL CITRATE (PF) 100 MCG/2ML IJ SOLN: 25.0000 ug | INTRAMUSCULAR | Status: DC | PRN

## 2021-12-30 MED ORDER — LIDOCAINE-EPINEPHRINE 1 %-1:100000 IJ SOLN
INTRAMUSCULAR | Status: AC
  Filled 2021-12-30: qty 1

## 2021-12-30 MED ORDER — PROPOFOL 10 MG/ML IV BOLUS
INTRAVENOUS | Status: DC | PRN
  Administered 2021-12-30: 150 mg via INTRAVENOUS

## 2021-12-30 MED ORDER — ORAL CARE MOUTH RINSE: 15.0000 mL | Freq: Once | OROMUCOSAL | Status: AC

## 2021-12-30 MED ORDER — FENTANYL CITRATE (PF) 250 MCG/5ML IJ SOLN
INTRAMUSCULAR | Status: AC
  Filled 2021-12-30: qty 5

## 2021-12-30 MED ORDER — MIDAZOLAM HCL 2 MG/2ML IJ SOLN
INTRAMUSCULAR | Status: AC
  Filled 2021-12-30: qty 2

## 2021-12-30 MED ORDER — SUCCINYLCHOLINE CHLORIDE 200 MG/10ML IV SOSY
PREFILLED_SYRINGE | INTRAVENOUS | Status: AC
  Filled 2021-12-30: qty 10

## 2021-12-30 MED ORDER — FERRIC SUBSULFATE 259 MG/GM EX SOLN
CUTANEOUS | Status: DC | PRN
  Administered 2021-12-30: 1 via TOPICAL

## 2021-12-30 MED ORDER — IODINE STRONG (LUGOLS) 5 % PO SOLN
ORAL | Status: AC
  Filled 2021-12-30: qty 1

## 2021-12-30 MED ORDER — CHLORHEXIDINE GLUCONATE 0.12 % MT SOLN
OROMUCOSAL | Status: AC
  Administered 2021-12-30: 15 mL via OROMUCOSAL
  Filled 2021-12-30: qty 15

## 2021-12-30 MED ORDER — FAMOTIDINE 20 MG PO TABS
ORAL_TABLET | ORAL | Status: AC
  Filled 2021-12-30: qty 1

## 2021-12-30 MED ORDER — ROCURONIUM BROMIDE 10 MG/ML (PF) SYRINGE
PREFILLED_SYRINGE | INTRAVENOUS | Status: AC
  Filled 2021-12-30: qty 10

## 2021-12-30 MED ORDER — IODINE STRONG (LUGOLS) 5 % PO SOLN
ORAL | Status: DC | PRN
  Administered 2021-12-30: 2 mL

## 2021-12-30 MED ORDER — MIDAZOLAM HCL 2 MG/2ML IJ SOLN
INTRAMUSCULAR | Status: DC | PRN
  Administered 2021-12-30: 2 mg via INTRAVENOUS

## 2021-12-30 MED ORDER — FAMOTIDINE 20 MG PO TABS
20.0000 mg | ORAL_TABLET | Freq: Once | ORAL | Status: AC
  Administered 2021-12-30: 20 mg via ORAL

## 2021-12-30 MED ORDER — FENTANYL CITRATE (PF) 100 MCG/2ML IJ SOLN
INTRAMUSCULAR | Status: DC | PRN
  Administered 2021-12-30 (×2): 50 ug via INTRAVENOUS

## 2021-12-30 MED ORDER — ONDANSETRON HCL 4 MG/2ML IJ SOLN: 4.0000 mg | Freq: Once | INTRAMUSCULAR | Status: AC | PRN

## 2021-12-30 MED ORDER — LIDOCAINE-EPINEPHRINE 1 %-1:100000 IJ SOLN
INTRAMUSCULAR | Status: DC | PRN
  Administered 2021-12-30: 10 mL

## 2021-12-30 MED ORDER — LIDOCAINE HCL (CARDIAC) PF 100 MG/5ML IV SOSY
PREFILLED_SYRINGE | INTRAVENOUS | Status: DC | PRN
  Administered 2021-12-30: 60 mg via INTRAVENOUS

## 2021-12-30 MED ORDER — CHLORHEXIDINE GLUCONATE 0.12 % MT SOLN: 15.0000 mL | Freq: Once | OROMUCOSAL | Status: AC

## 2021-12-30 SURGICAL SUPPLY — 33 items
APPLICATOR COTTON TIP 6 STRL (MISCELLANEOUS) ×1 IMPLANT
APPLICATOR COTTON TIP 6IN STRL (MISCELLANEOUS) ×2
APPLICATOR SWAB PROCTO LG 16IN (MISCELLANEOUS) ×2 IMPLANT
BACTOSHIELD CHG 4% 4OZ (MISCELLANEOUS) ×1
DRAPE UNDER BUTTOCK W/FLU (DRAPES) ×2 IMPLANT
DRSG TELFA 3X8 NADH (GAUZE/BANDAGES/DRESSINGS) ×2 IMPLANT
ELECT LEEP LOOP 1.0CM .7CM (MISCELLANEOUS) ×2
ELECT LEEP LOOP 20X10 R2010 (MISCELLANEOUS) ×2
ELECT REM PT RETURN 9FT ADLT (ELECTROSURGICAL) ×2
ELECTRODE LEEP LOOP 1.0CM .7CM (MISCELLANEOUS) ×1 IMPLANT
ELECTRODE LEP LOOP 20X10 R2010 (MISCELLANEOUS) ×1 IMPLANT
ELECTRODE REM PT RTRN 9FT ADLT (ELECTROSURGICAL) ×1 IMPLANT
GLOVE BIO SURGEON STRL SZ 6.5 (GLOVE) ×2 IMPLANT
GLOVE SURG UNDER LTX SZ7 (GLOVE) ×2 IMPLANT
GOWN STRL REUS W/ TWL LRG LVL3 (GOWN DISPOSABLE) ×2 IMPLANT
GOWN STRL REUS W/TWL LRG LVL3 (GOWN DISPOSABLE) ×2
HANDLE YANKAUER SUCT BULB TIP (MISCELLANEOUS) ×2 IMPLANT
KIT TURNOVER CYSTO (KITS) ×2 IMPLANT
LABEL OR SOLS (LABEL) ×2 IMPLANT
MANIFOLD NEPTUNE II (INSTRUMENTS) ×2 IMPLANT
NDL SPNL 22GX3.5 QUINCKE BK (NEEDLE) ×1 IMPLANT
NEEDLE SPNL 22GX3.5 QUINCKE BK (NEEDLE) ×2 IMPLANT
PACK DNC HYST (MISCELLANEOUS) ×2 IMPLANT
PAD DRESSING TELFA 3X8 NADH (GAUZE/BANDAGES/DRESSINGS) ×1 IMPLANT
PAD PREP 24X41 OB/GYN DISP (PERSONAL CARE ITEMS) ×2 IMPLANT
PENCIL ELECTRO HAND CTR (MISCELLANEOUS) ×2 IMPLANT
SCRUB CHG 4% DYNA-HEX 4OZ (MISCELLANEOUS) ×1 IMPLANT
SET CYSTO W/LG BORE CLAMP LF (SET/KITS/TRAYS/PACK) IMPLANT
SOL PREP PVP 2OZ (MISCELLANEOUS) ×2
SOLUTION PREP PVP 2OZ (MISCELLANEOUS) ×1 IMPLANT
STRAW SMOKE EVAC LEEP 6150 NON (MISCELLANEOUS) ×2 IMPLANT
SYR 10ML LL (SYRINGE) ×2 IMPLANT
WATER STERILE IRR 500ML POUR (IV SOLUTION) ×2 IMPLANT

## 2021-12-30 NOTE — Op Note (Signed)
Procedure(s): LOOP ELECTROSURGICAL EXCISION PROCEDURE (LEEP) Procedure Note  Janet Thompson female 30 y.o. 12/30/2021  Indications: The patient is a 31 y.o. G88P2002 female with moderate to severe cervical dysplasia (CIN II-III) on colposcopic biopsies with negative ECC. History of ASCUS HR HPV+ pap smear.   Pre-operative Diagnosis: Moderate to severe cervical dysplasia.   Post-operative Diagnosis: Same  Surgeon: Rubie Maid, MD  Assistants:  None.   Anesthesia: General endotracheal anesthesia  Findings: Areas of decreased uptake at 3 o'clock after application of Lugol's solution.   Procedure Details: The patient was seen in the Holding Room. The risks, benefits, complications, treatment options, and expected outcomes were discussed with the patient.  The patient concurred with the proposed plan, giving informed consent.  The site of surgery properly noted/marked. The patient was taken to the Operating Room, identified as Janet Thompson and the procedure verified as Procedure(s) (LRB): LOOP ELECTROSURGICAL EXCISION PROCEDURE (LEEP) (N/A).   She was then placed under general anesthesia without difficulty. She was placed in the dorsal lithotomy position, and was prepped and draped in a sterile manner.  A Time Out was held and the above information confirmed.  A straight catheterization was performed. The bivalved coated speculum was placed in the patient's vagina. A grounding pad placed on the patient. Lugol's solution was applied to the cervix and areas of decreased uptake were noted around the transformation zone.   Local anesthesia was administered via an intracervical block using 10 ml of 1% Lidocaine with epinephrine. The suction was turned on and the Large Fisher Cone Biopsy Excisor on 60 Watts of blended current was used to excise the area of decreased uptake and excise the entire transformation zone. Excellent hemostasis was achieved using roller ball coagulation set at 60 Watts  coagulation current. Monsel's solution was then applied and the speculum was removed from the vagina. Specimens were sent to pathology.  ?The patient tolerated the procedure well. All instruments, needles, and sponge counts were correct x 2. The patient was taken to the recovery room awake, extubated and in stable condition.   Post-operative instructions given to patient, including instruction to seek medical attention for persistent bright red bleeding, fever, abdominal/pelvic pain, dysuria, nausea or vomiting. She was also told about the possibility of having copious yellow to black tinged discharge for weeks. She was counseled to avoid anything in the vagina (sex/douching/tampons) for 3 weeks. She will have a 4 week post-operative check to assess wound healing, review results and discuss further management.    Estimated Blood Loss:  10 ml      Drains: straight catheterization prior to procedure with 100 ml of clear urine         Total IV Fluids: 200 ml  Specimens: Ectocervix LEEP specimen         Implants: None         Complications:  None; patient tolerated the procedure well.         Disposition: PACU - hemodynamically stable.         Condition: stable   Rubie Maid, MD [facility]

## 2021-12-30 NOTE — Anesthesia Postprocedure Evaluation (Signed)
Anesthesia Post Note  Patient: Janet Thompson  Procedure(s) Performed: LOOP ELECTROSURGICAL EXCISION PROCEDURE (LEEP)  Patient location during evaluation: PACU Anesthesia Type: General Level of consciousness: awake and awake and alert Pain management: satisfactory to patient Vital Signs Assessment: post-procedure vital signs reviewed and stable Respiratory status: spontaneous breathing and nonlabored ventilation Cardiovascular status: stable Anesthetic complications: no   No notable events documented.   Last Vitals:  Vitals:   12/30/21 1245 12/30/21 1251  BP: 107/64 (!) 138/56  Pulse: (!) 55 69  Resp: 17 16  Temp: (!) 36.1 C 36.6 C  SpO2: 98% 100%    Last Pain:  Vitals:   12/30/21 1251  TempSrc: Temporal  PainSc: 0-No pain                 VAN STAVEREN,Jonh Mcqueary

## 2021-12-30 NOTE — Transfer of Care (Signed)
Immediate Anesthesia Transfer of Care Note  Patient: Taren Dymek  Procedure(s) Performed: LOOP ELECTROSURGICAL EXCISION PROCEDURE (LEEP)  Patient Location: PACU  Anesthesia Type:General  Level of Consciousness: awake and sedated  Airway & Oxygen Therapy: Patient Spontanous Breathing and Patient connected to face mask oxygen  Post-op Assessment: Report given to RN and Post -op Vital signs reviewed and stable  Post vital signs: Reviewed and stable  Last Vitals:  Vitals Value Taken Time  BP    Temp    Pulse    Resp    SpO2      Last Pain:  Vitals:   12/30/21 0926  TempSrc: Oral  PainSc: 0-No pain         Complications: No notable events documented.

## 2021-12-30 NOTE — Anesthesia Preprocedure Evaluation (Addendum)
Anesthesia Evaluation  Patient identified by MRN, date of birth, ID band Patient awake    Reviewed: Allergy & Precautions, NPO status , Patient's Chart, lab work & pertinent test results  History of Anesthesia Complications Negative for: history of anesthetic complications  Airway Mallampati: II  TM Distance: >3 FB Neck ROM: Full    Dental  (+) Teeth Intact   Pulmonary neg pulmonary ROS,    Pulmonary exam normal breath sounds clear to auscultation       Cardiovascular Exercise Tolerance: Good negative cardio ROS Normal cardiovascular exam+ dysrhythmias Supra Ventricular Tachycardia  Rhythm:Regular Rate:Normal     Neuro/Psych  Headaches, negative neurological ROS  negative psych ROS   GI/Hepatic negative GI ROS, Neg liver ROS,   Endo/Other  negative endocrine ROS  Renal/GU negative Renal ROS  negative genitourinary   Musculoskeletal   Abdominal Normal abdominal exam  (+)   Peds negative pediatric ROS (+)  Hematology negative hematology ROS (+)   Anesthesia Other Findings Past Medical History: 11/2021: Cervical dysplasia, moderate No date: Frequent headaches No date: History of fainting spells of unknown cause No date: Menstrual irregularity No date: Migraines 05/07/2018: Placenta previa antepartum in second trimester No date: Positive QuantiFERON-TB Gold test  Past Surgical History: No date: WISDOM TOOTH EXTRACTION  BMI    Body Mass Index: 23.63 kg/m      Reproductive/Obstetrics negative OB ROS                             Anesthesia Physical Anesthesia Plan  ASA: 1  Anesthesia Plan: General   Post-op Pain Management:    Induction: Intravenous  PONV Risk Score and Plan: 1 and Ondansetron and Dexamethasone  Airway Management Planned: LMA  Additional Equipment:   Intra-op Plan:   Post-operative Plan: Extubation in OR  Informed Consent: I have reviewed the  patients History and Physical, chart, labs and discussed the procedure including the risks, benefits and alternatives for the proposed anesthesia with the patient or authorized representative who has indicated his/her understanding and acceptance.       Plan Discussed with: CRNA and Surgeon  Anesthesia Plan Comments:         Anesthesia Quick Evaluation

## 2021-12-30 NOTE — Anesthesia Procedure Notes (Signed)
Procedure Name: LMA Insertion Date/Time: 12/30/2021 11:15 AM  Performed by: Jonna Clark, CRNAPre-anesthesia Checklist: Patient identified, Patient being monitored, Timeout performed, Emergency Drugs available and Suction available Patient Re-evaluated:Patient Re-evaluated prior to induction Oxygen Delivery Method: Circle system utilized Preoxygenation: Pre-oxygenation with 100% oxygen Induction Type: IV induction Ventilation: Mask ventilation without difficulty LMA: LMA inserted LMA Size: 4.0 Tube type: Oral Number of attempts: 1 Placement Confirmation: positive ETCO2 and breath sounds checked- equal and bilateral Tube secured with: Tape Dental Injury: Teeth and Oropharynx as per pre-operative assessment

## 2021-12-30 NOTE — Discharge Instructions (Signed)
AMBULATORY SURGERY  DISCHARGE INSTRUCTIONS   The drugs that you were given will stay in your system until tomorrow so for the next 24 hours you should not:  Drive an automobile Make any legal decisions Drink any alcoholic beverage   You may resume regular meals tomorrow.  Today it is better to start with liquids and gradually work up to solid foods.  You may eat anything you prefer, but it is better to start with liquids, then soup and crackers, and gradually work up to solid foods.   Please notify your doctor immediately if you have any unusual bleeding, trouble breathing, redness and pain at the surgery site, drainage, fever, or pain not relieved by medication.       Please contact your physician with any problems or Same Day Surgery at [phone], Monday through Friday 6 am to 4 pm, or [place] [facility] [place] [place] Main number at [phone].  

## 2021-12-30 NOTE — H&P (Signed)
GYNECOLOGY PREOPERATIVE HISTORY AND PHYSICAL   Subjective:  Janet Thompson is a 31 y.o. [id number] here for surgical management of moderate cervical dysplasia.  . No significant preoperative concerns.  Proposed surgery: LEEP    Pertinent Gynecological History: Menses: regular every month without intermenstrual spotting Contraception: vasectomy Last pap: abnormal: ASCUS HR HPV+  Date: 10/23/2021. S/p LEEP on 11/20/2021 with CIN II-III, negative ECC.   Past Medical History:  Diagnosis Date   Cervical dysplasia, moderate 11/2021   Frequent headaches    History of fainting spells of unknown cause    Menstrual irregularity    Migraines    Placenta previa antepartum in second trimester 05/07/2018   Positive QuantiFERON-TB Gold test     Past Surgical History:  Procedure Laterality Date   WISDOM TOOTH EXTRACTION      OB History  Gravida Para Term Preterm AB Living  '3 2 2 '$ 0 0 2  SAB IAB Ectopic Multiple Live Births  0 0 0 0 2    # Outcome Date GA Lbr Len/2nd Weight Sex Delivery Anes PTL Lv  3 Gravida           2 Term 11/04/18 [redacted]w[redacted]d/ 00:31 3884 g M Vag-Spont EPI N LIV  1 Term 06/24/15 327w5d0:50 / 01:39 3760 g M Vag-Spont EPI  LIV    Family History  Problem Relation Age of Onset   Healthy Mother        heavy menses; early hysterectomy   High blood pressure Father    Alcohol abuse Maternal Grandfather        colon   Diabetes Paternal Grandmother    Colon cancer Maternal Aunt [id number]  Social History   Socioeconomic History   Marital status: Married    Spouse name: John   Number of children: 3   Years of education: Not on file   Highest education level: Not on file  Occupational History   Not on file  Tobacco Use   Smoking status: Never   Smokeless tobacco: Never  Vaping Use   Vaping Use: Never used  Substance and Sexual Activity   Alcohol use: Yes    Comment: occasional   Drug use: No   Sexual activity: Yes    Birth control/protection: None  Other  Topics Concern   Not on file  Social History Narrative   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   No current outpatient medications on file prior to encounter.    No Known Allergies    Review of Systems Constitutional: No recent fever/chills/sweats Respiratory: No recent cough/bronchitis Cardiovascular: No chest pain Gastrointestinal: No recent nausea/vomiting/diarrhea Genitourinary: No UTI symptoms Hematologic/lymphatic:No history of coagulopathy or recent blood thinner use    Objective:   Blood pressure 138/90, pulse 86, temperature 97.9 F (36.6 C), temperature source Oral, resp. rate 16, height '5\' 9"'$  (1.753 m), weight 72.6 kg, last menstrual period 12/11/2021, SpO2 100 %.  CONSTITUTIONAL: Well-developed, well-nourished female in no acute distress.  HENT:  Normocephalic, atraumatic, External right and left ear normal. Oropharynx is clear and moist EYES: Conjunctivae and EOM are normal. Pupils are equal,  round, and reactive to light. No scleral icterus.  NECK: Normal range of motion, supple, no masses SKIN: Skin is warm and dry. No rash noted. Not diaphoretic. No erythema. No pallor. NEUROLOGIC: Alert and oriented to person, place, and time. Normal reflexes, muscle tone coordination. No cranial nerve deficit noted. PSYCHIATRIC: Normal mood and affect. Normal behavior. Normal judgment and thought content. CARDIOVASCULAR: Normal heart rate noted, regular rhythm RESPIRATORY: Effort and breath sounds normal, no problems with respiration noted ABDOMEN: Soft, nontender, nondistended. PELVIC: Deferred MUSCULOSKELETAL: Normal range of  motion. No edema and no tenderness. 2+ distal pulses.    Labs: Results for orders placed or performed during the hospital encounter of 12/30/21 (from the past 336 hour(s))  Pregnancy, urine POC   Collection Time: 12/30/21  9:33 AM  Result Value Ref Range   Preg Test, Ur NEGATIVE NEGATIVE  Results for orders placed or performed during the hospital encounter of 12/25/21 (from the past 336 hour(s))  CBC per protocol   Collection Time: 12/25/21  8:44 AM  Result Value Ref Range   WBC 5.6 4.0 - 10.5 K/uL   RBC 4.51 3.87 - 5.11 MIL/uL   Hemoglobin 12.2 12.0 - 15.0 g/dL   HCT 38.3 36.0 - 46.0 %   MCV 84.9 80.0 - 100.0 fL   MCH 27.1 26.0 - 34.0 pg   MCHC 31.9 30.0 - 36.0 g/dL   RDW 13.8 11.5 - 15.5 %   Platelets 249 150 - 400 K/uL   nRBC 0.0 0.0 - 0.2 %    Pathology (Colposcopy, performed 11/20/2021):   A. CERVIX, 12 O'CLOCK, BIOPSY:  -  High-grade squamous intraepithelial lesion (CIN 2-3; moderate to  severe dysplasia)  -  See comment   B. CERVIX, 6 O'CLOCK, BIOPSY:  -  High-grade squamous intraepithelial lesion (CIN 2-3; moderate to  severe dysplasia) extending into the endocervical glands  -  See comment   C. ENDOCERVIX, CURETTAGE:  -  Predominantly mucoid inflammatory debris with scant atypical squamous  epithelium and benign endocervical glandular epithelium   Imaging Studies: No results found.  Assessment:     CIN II-III      Plan:   - Counseling: Procedure, risks, reasons, benefits and complications (including injury to bowel, bladder, major blood vessel, ureter, bleeding, possibility of transfusion, infection, or fistula formation) reviewed in detail. Likelihood of success in alleviating the patient's condition was discussed. Routine postoperative instructions were reviewed with the patient and her family in detail after surgery.  The patient concurred with the proposed plan, giving informed written consent for the surgery.   - Patient has been NPO since midnight.      Rubie Maid, MD [facility]

## 2021-12-31 ENCOUNTER — Encounter: Payer: Self-pay | Admitting: Obstetrics and Gynecology

## 2022-01-01 LAB — SURGICAL PATHOLOGY

## 2022-01-07 IMAGING — US US MFM OB DETAIL+14 WK
1 series · 13 of 28 positions shown · non-contrast
Comparison: none

[Series 1: us mfm ob detail+14 wk · 13 of 86 slices shown]
[im 4/86]
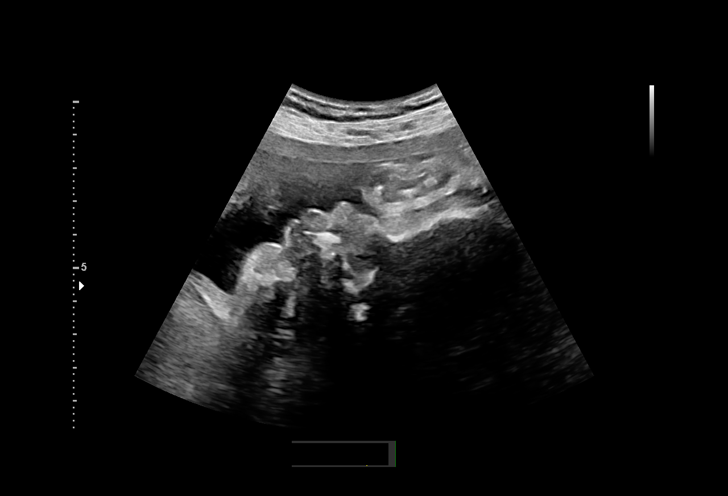
[im 10/86]
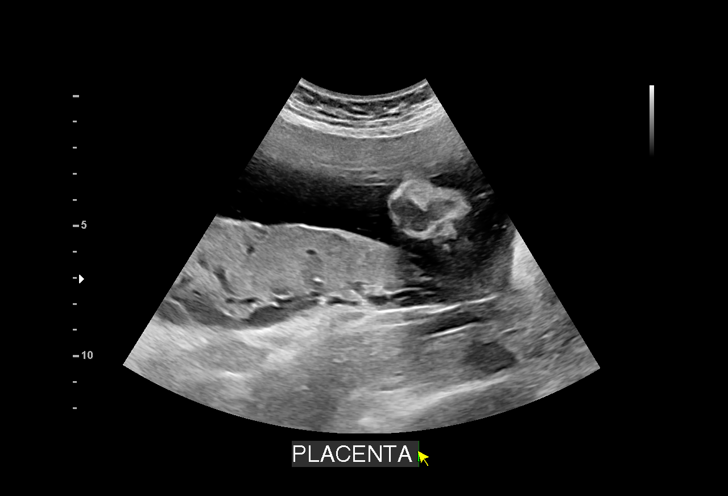
[im 16/86]
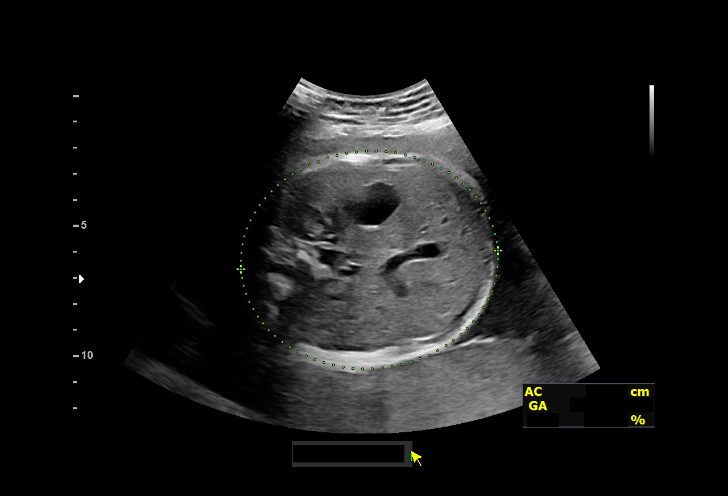
[im 23/86]
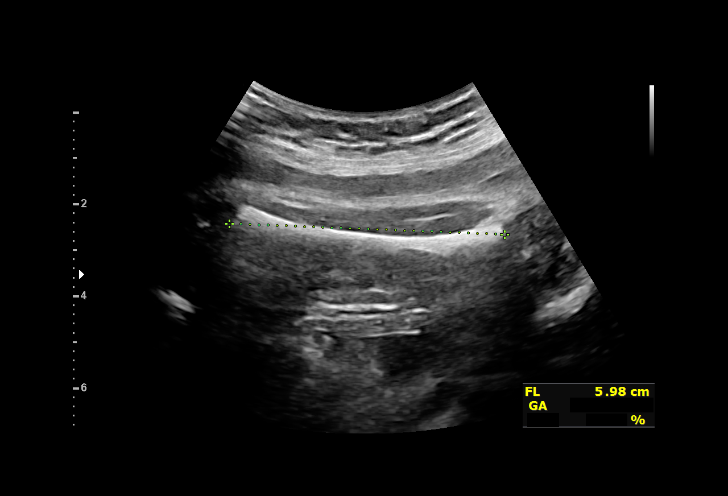
[im 29/86]
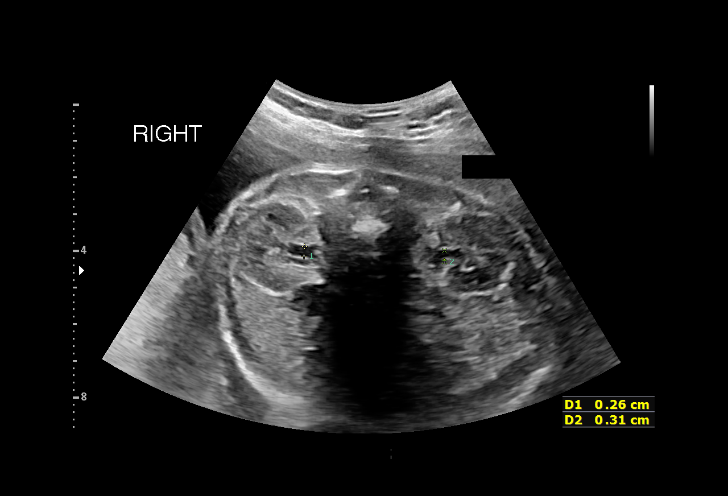
[im 35/86]
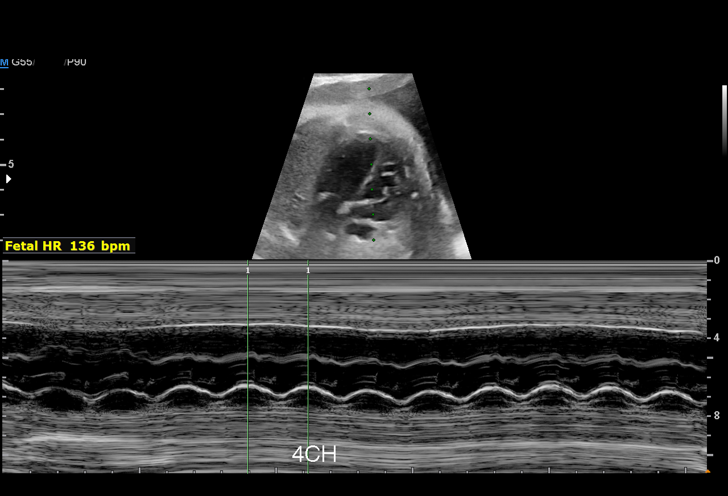
[im 45/86]
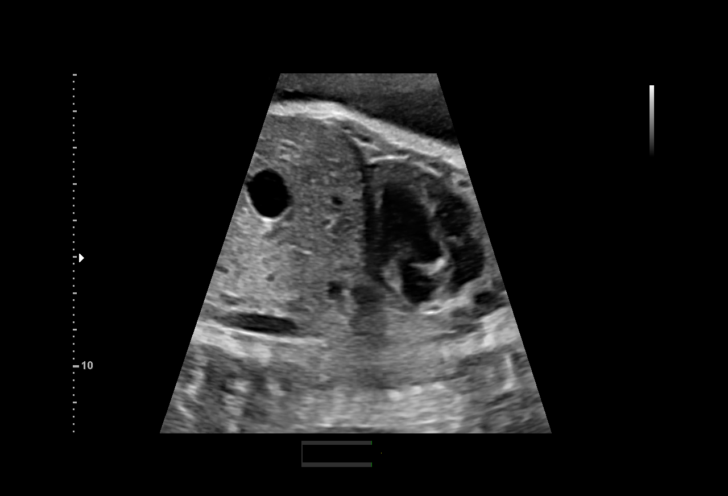
[im 51/86]
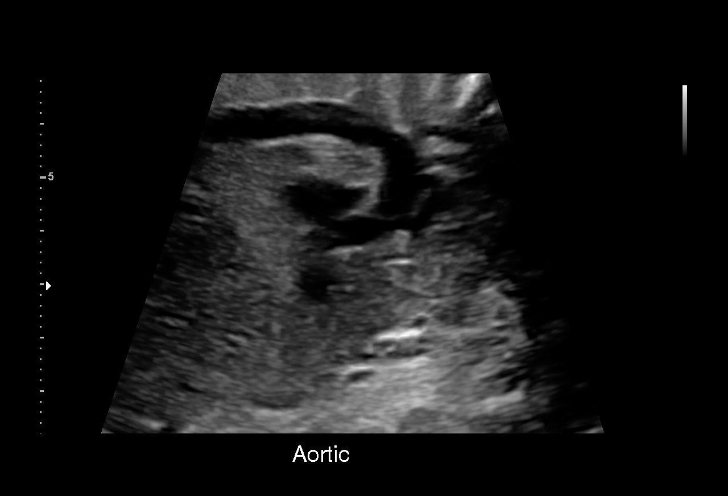
[im 57/86]
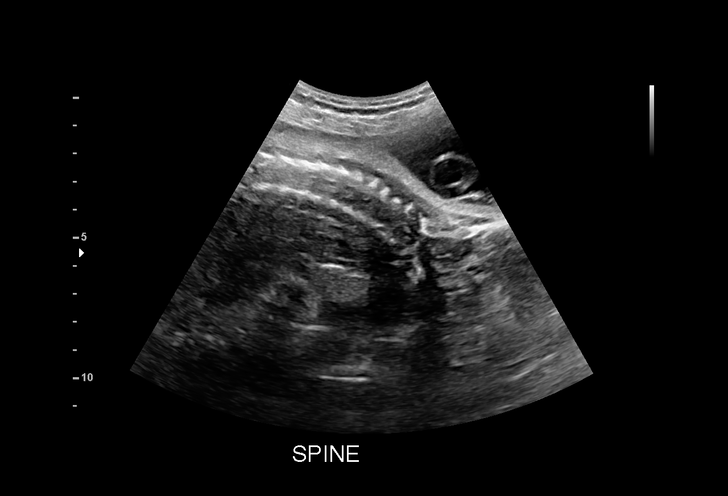
[im 63/86]
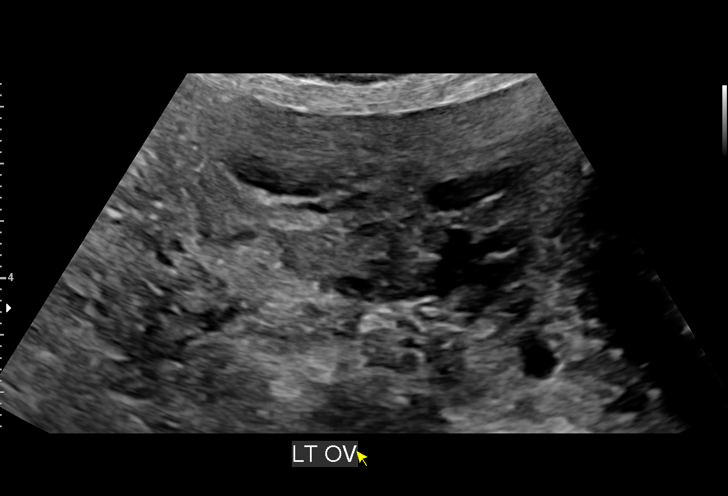
[im 70/86]
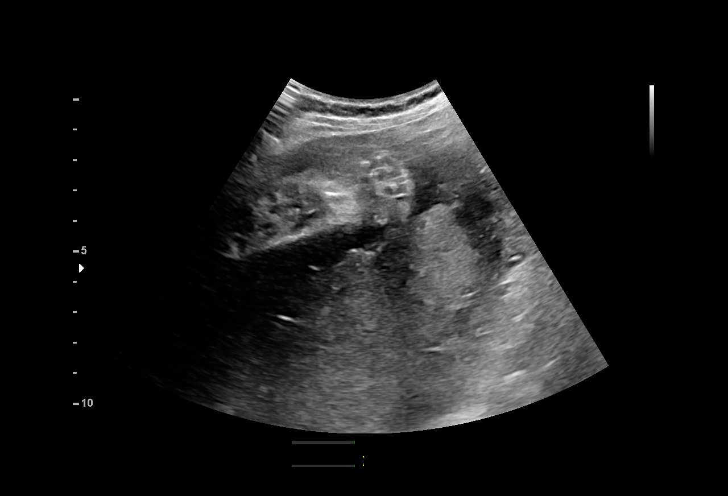
[im 76/86]
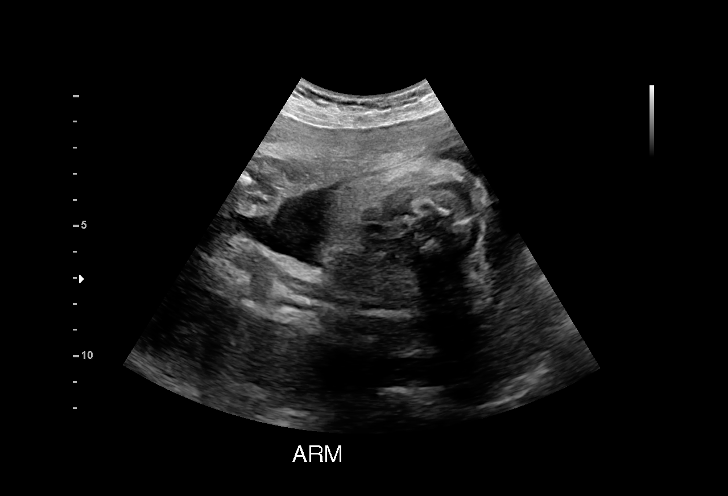
[im 82/86]
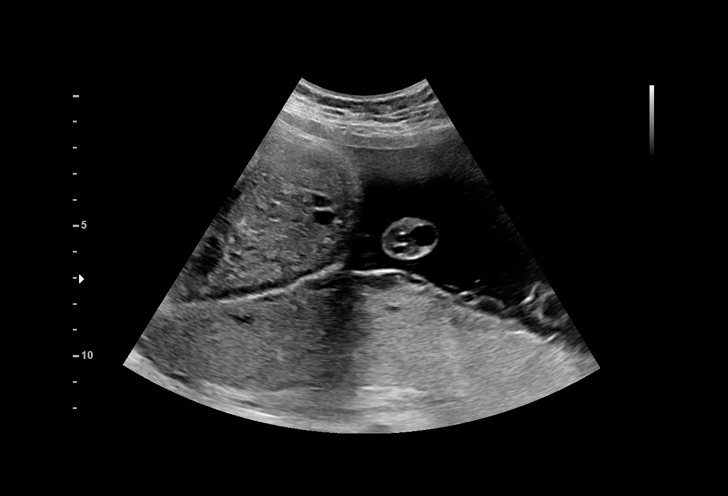

[13 of 28 positions shown; findings below may reference images not displayed]

[REDACTED]
                   [facility] CNM

                                                      THIBODEAUX

Indications

 Pyelectasis of fetus on prenatal ultrasound
 32 weeks gestation of pregnancy
Fetal Evaluation

 Num Of Fetuses:         1
 Fetal Heart Rate(bpm):  136
 Cardiac Activity:       Observed
 Presentation:           Cephalic
 Placenta:               Posterior
 P. Cord Insertion:      Visualized, central

 Amniotic Fluid
 AFI FV:      Within normal limits

 AFI Sum(cm)     %Tile       Largest Pocket(cm)
 14.3            49

 RUQ(cm)       RLQ(cm)       LUQ(cm)        LLQ(cm)

Biometry

 BPD:      89.2  mm     G. Age:  36w 1d       > 99  %    CI:        78.48   %    70 - 86
                                                         FL/HC:      18.7   %    19.1 -
 HC:      318.5  mm     G. Age:  35w 6d         95  %    HC/AC:      1.11        0.96 -
 AC:      287.3  mm     G. Age:  32w 5d         67  %    FL/BPD:     66.9   %    71 - 87
 FL:       59.7  mm     G. Age:  31w 1d         13  %    FL/AC:      20.8   %    20 - 24
 HUM:        54  mm     G. Age:  31w 3d         37  %
 CER:      43.8  mm     G. Age:  34w 2d         84  %
 LV:        6.3  mm
 CM:          5  mm

 Est. FW:    9218  gm      4 lb 9 oz     63  %
OB History

 Gravidity:    3         Term:   2
 Living:       2
Gestational Age

 LMP:           32w 1d        Date:  06/13/20                 EDD:   03/20/21
 U/S Today:     34w 0d                                        EDD:   03/07/21
 Best:          32w 1d     Det. By:  LMP  (06/13/20)          EDD:   03/20/21
Anatomy

 Cranium:               Appears normal         Aortic Arch:            Appears normal
 Cavum:                 Appears normal         Ductal Arch:            Appears normal
 Ventricles:            Appears normal         Diaphragm:              Appears normal
 Choroid Plexus:        Appears normal         Stomach:                Appears normal, left
                                                                       sided
 Cerebellum:            Appears normal         Abdomen:                Appears normal
 Posterior Fossa:       Appears normal         Abdominal Wall:         Appears nml (cord
                                                                       insert, abd wall)
 Nuchal Fold:           Appears normal         Cord Vessels:           Appears normal (3
                                                                       vessel cord)
 Face:                  Appears normal         Kidneys:                Appear normal
                        (orbits and profile)
 Lips:                  Appears normal         Bladder:                Appears normal
 Heart:                 Appears normal         Spine:                  Appears normal
                        (4CH, axis, and
                        situs)
 RVOT:                  Appears normal         Upper Extremities:      Appears normal
 LVOT:                  Appears normal         Lower Extremities:      Appears normal
Impression

 G3 P2.  Patient is here for fetal anatomy scan to evaluate the
 kidneys.  On ultrasound performed at 25 weeks gestation,
 mild right urinary tract dilation was seen.
 Her prenatal course has otherwise been uneventful.  She
 does not have gestational diabetes.

 Obstetrical history is significant for 2 term vaginal deliveries.

 On today's ultrasound, fetal growth is appropriate for
 gestational age.  Amniotic fluid is normal and good fetal
 activity seen.  No obvious fetal structural defects are seen.
 Both kidneys appear normal with no evidence of urinary tract
 dilations.
 I reassured the patient of the findings.
Recommendations

 Follow-up scans as clinically indicated.
                 Sejje, Jonathan Harry

## 2022-01-27 NOTE — Progress Notes (Unsigned)
    OBSTETRICS/GYNECOLOGY POST-OPERATIVE CLINIC VISIT  Subjective:     Megin Consalvo is a 31 y.o. female who presents to the clinic 4 weeks status post  [place] (LEEP)for MODERATE CERVICAL DYSPLASIA. Eating a regular diet without difficulty. Bowel movements are normal. The patient is not having any pain.  {Common ambulatory SmartLinks:19316}  Review of Systems {ros; complete:30496}   Objective:   There were no vitals taken for this visit. There is no height or weight on file to calculate BMI.  General:  alert and no distress  Abdomen: soft, bowel sounds active, non-tender  Incision:   healing well, no drainage, no erythema, no hernia, no seroma, no swelling, no dehiscence, incision well approximated    Pathology:    Assessment:   Patient s/p LOOP ELECTROSURGICAL EXCISION PROCEDURE (LEEP) (surgery)  Doing well postoperatively.   Plan:   1. Continue any current medications as instructed by provider. 2. Wound care discussed. 3. Operative findings again reviewed. Pathology report discussed. 4. Activity restrictions: none 5. Anticipated return to work: {work return:14002}. 6. Follow up: {4-16:38453} {time; units:18646} for ***    Rubie Maid, MD [facility]

## 2022-01-28 ENCOUNTER — Ambulatory Visit (INDEPENDENT_AMBULATORY_CARE_PROVIDER_SITE_OTHER): Payer: No Typology Code available for payment source | Admitting: Obstetrics and Gynecology

## 2022-01-28 ENCOUNTER — Encounter: Payer: Self-pay | Admitting: Obstetrics and Gynecology

## 2022-01-28 VITALS — BP 133/82 | HR 90 | Ht 70.0 in | Wt 156.0 lb

## 2022-01-28 DIAGNOSIS — D069 Carcinoma in situ of cervix, unspecified: Secondary | ICD-10-CM | POA: Insufficient documentation

## 2022-01-28 DIAGNOSIS — Z4889 Encounter for other specified surgical aftercare: Secondary | ICD-10-CM

## 2022-01-28 DIAGNOSIS — Z9889 Other specified postprocedural states: Secondary | ICD-10-CM

## 2022-03-10 ENCOUNTER — Ambulatory Visit (INDEPENDENT_AMBULATORY_CARE_PROVIDER_SITE_OTHER): Payer: No Typology Code available for payment source | Admitting: Family Medicine

## 2022-03-10 ENCOUNTER — Encounter: Payer: Self-pay | Admitting: Family Medicine

## 2022-03-10 VITALS — BP 116/80 | HR 82 | Temp 97.9°F | Ht 70.0 in | Wt 156.3 lb

## 2022-03-10 DIAGNOSIS — G43809 Other migraine, not intractable, without status migrainosus: Secondary | ICD-10-CM | POA: Diagnosis not present

## 2022-03-10 DIAGNOSIS — Z23 Encounter for immunization: Secondary | ICD-10-CM

## 2022-03-10 NOTE — Progress Notes (Unsigned)
   Established Patient Office Visit  Subjective   Patient ID: Janet Thompson, female    DOB: October 22, 1990  Age: 31 y.o. MRN: [id number]  Chief Complaint  Patient presents with   Establish Care    Patient is here for transition of care visit. Patient reports she is off her OCP's and had improvement in her migraine symptoms since her pregnancy and birth of her son. Patient states that she is off all of her migraine medication, states she never tried the [place]. States that she is doing very well otherwise. Patient reports she had a LEEP procedure in July and will have to have pap smears every 6 months.   No current outpatient medications   Patient Active Problem List   Diagnosis Date Noted   CIN III (cervical intraepithelial neoplasia grade III) with severe dysplasia 01/28/2022   Migraines 08/17/2017      Review of Systems  Neurological:  Negative for headaches.  All other systems reviewed and are negative.     Objective:     BP 116/80 (BP Location: Left Arm, Patient Position: Sitting, Cuff Size: Normal)   Pulse 82   Temp 97.9 F (36.6 C) (Oral)   Ht '5\' 10"'$  (1.778 m)   Wt 156 lb 4.8 oz (70.9 kg)   LMP 03/03/2022 (Exact Date)   SpO2 100%   BMI 22.43 kg/m    Physical Exam Vitals reviewed.  Constitutional:      Appearance: Normal appearance. She is well-groomed and normal weight.  HENT:     Head: Normocephalic and atraumatic.  Eyes:     Conjunctiva/sclera: Conjunctivae normal.  Cardiovascular:     Rate and Rhythm: Normal rate and regular rhythm.     Heart sounds: S1 normal and S2 normal.  Pulmonary:     Effort: Pulmonary effort is normal.     Breath sounds: Normal breath sounds and air entry.  Abdominal:     General: Bowel sounds are normal.  Musculoskeletal:        General: Normal range of motion.     Cervical back: Normal range of motion and neck supple.  Neurological:     Mental Status: She is alert and oriented to person, place, and time. Mental status is  at baseline.     Gait: Gait is intact.  Psychiatric:        Mood and Affect: Mood and affect normal.        Speech: Speech normal.        Behavior: Behavior normal.        Judgment: Judgment normal.      No results found for any visits on 03/10/22.    The ASCVD Risk score (Arnett DK, et al., 2019) failed to calculate for the following reasons:   The 2019 ASCVD risk score is only valid for ages 5 to 53    Assessment & Plan:   Problem List Items Addressed This Visit       Cardiovascular and Mediastinum   Migraines    Resolved s/p stopping OCP's, states she only has them occasionally and uses PRN NSAIDS OTC. Will continue these as needed.      Other Visit Diagnoses     Immunization due    -  Primary   Relevant Orders   Flu Vaccine QUAD 6+ mos PF IM (Fluarix Quad PF) (Completed)       Return in about 1 year (around 03/11/2023) for Annual physical .    Farrel Conners, MD

## 2022-03-12 NOTE — Assessment & Plan Note (Signed)
Resolved s/p stopping OCP's, states she only has them occasionally and uses PRN NSAIDS OTC. Will continue these as needed.

## 2022-05-27 NOTE — Progress Notes (Unsigned)
    GYNECOLOGY PROGRESS NOTE  Subjective:    Patient ID: Janet Thompson, female    DOB: January 22, 1991, 31 y.o.   MRN: [id number]  HPI  Patient is a 31 y.o. [id number] female who presents for follow up pap smear. Her last pap smear was ASC-US and positive for High Risk HPV. Colposcopy was done on 11/20/2021 and results were: High-grade squamous intraepithelial lesion (CIN 2-3; moderate to severe dysplasia). She had a LEEP on 12/30/2021 and the results were: [place] (HSIL/CIN-3).   The following portions of the patient's history were reviewed and updated as appropriate: allergies, current medications, past family history, past medical history, past social history, past surgical history, and problem list.  Review of Systems Pertinent items are noted in HPI.   Objective:   There were no vitals taken for this visit. There is no height or weight on file to calculate BMI. General appearance: alert, cooperative, and no distress Abdomen: {abdominal exam:16834} Pelvic: {pelvic exam:16852::"cervix normal in appearance","external genitalia normal","no adnexal masses or tenderness","no cervical motion tenderness","rectovaginal septum normal","uterus normal size, shape, and consistency","vagina normal without discharge"} Extremities: {extremity exam:5109} Neurologic: {neuro exam:17854}   Assessment:   1. CIN III (cervical intraepithelial neoplasia grade III) with severe dysplasia      Plan:   1. CIN III (cervical intraepithelial neoplasia grade III) with severe dysplasia ***      Rubie Maid, MD [place]

## 2022-05-28 ENCOUNTER — Ambulatory Visit (INDEPENDENT_AMBULATORY_CARE_PROVIDER_SITE_OTHER): Payer: No Typology Code available for payment source | Admitting: Obstetrics and Gynecology

## 2022-05-28 ENCOUNTER — Encounter: Payer: Self-pay | Admitting: Obstetrics and Gynecology

## 2022-05-28 ENCOUNTER — Other Ambulatory Visit (HOSPITAL_COMMUNITY)
Admission: RE | Admit: 2022-05-28 | Discharge: 2022-05-28 | Disposition: A | Discharge status: Home / Self Care | Payer: No Typology Code available for payment source | Source: Ambulatory Visit | Attending: Obstetrics and Gynecology | Admitting: Obstetrics and Gynecology

## 2022-05-28 VITALS — BP 129/83 | HR 86 | Resp 16 | Ht 69.0 in | Wt 153.8 lb

## 2022-05-28 DIAGNOSIS — D069 Carcinoma in situ of cervix, unspecified: Secondary | ICD-10-CM

## 2022-05-28 NOTE — Addendum Note (Signed)
Addended by: Chilton Greathouse on: 05/28/2022 09:19 AM   Modules accepted: Orders

## 2022-06-05 LAB — CYTOLOGY - PAP
Comment: NEGATIVE
Diagnosis: UNDETERMINED — AB
High risk HPV: NEGATIVE

## 2022-06-21 ENCOUNTER — Ambulatory Visit: Payer: No Typology Code available for payment source

## 2022-10-27 ENCOUNTER — Ambulatory Visit (INDEPENDENT_AMBULATORY_CARE_PROVIDER_SITE_OTHER): Payer: 59 | Admitting: Certified Nurse Midwife

## 2022-10-27 ENCOUNTER — Other Ambulatory Visit (HOSPITAL_COMMUNITY)
Admission: RE | Admit: 2022-10-27 | Discharge: 2022-10-27 | Disposition: A | Discharge status: Home / Self Care | Payer: 59 | Source: Ambulatory Visit | Attending: Certified Nurse Midwife | Admitting: Certified Nurse Midwife

## 2022-10-27 ENCOUNTER — Encounter: Payer: Self-pay | Admitting: Certified Nurse Midwife

## 2022-10-27 VITALS — BP 125/79 | HR 81 | Resp 16 | Ht 69.0 in | Wt 157.7 lb

## 2022-10-27 DIAGNOSIS — Z01419 Encounter for gynecological examination (general) (routine) without abnormal findings: Secondary | ICD-10-CM

## 2022-10-27 DIAGNOSIS — Z124 Encounter for screening for malignant neoplasm of cervix: Secondary | ICD-10-CM

## 2022-10-27 NOTE — Progress Notes (Signed)
GYNECOLOGY ANNUAL PREVENTATIVE CARE ENCOUNTER NOTE  History:     Janet Thompson is a 32 y.o. [id number] female here for a routine annual gynecologic exam.  Current complaints: has some spotting 1-2 prior to cycle since being off of BC. It has been comitant with each cylcle. She denies any burning, itching, or odor.   Denies abnormal vaginal bleeding, discharge, pelvic pain, problems with intercourse or other gynecologic concerns.     Social Relationship: married  Living: spouse and children Work: 2 x week at hospital  Exercise: walking  Smoke/Alcohol/drug use: rare alcohol use.   Gynecologic History Patient's last menstrual period was 10/02/2022 (exact date). Contraception: vasectomy Last Pap: 05/28/2022. Results were: ascus with negative HPV Colposocopy: 5/24/203 Leep: 12/30/2021 Last mammogram: n/a .  Obstetric History OB History  Gravida Para Term Preterm AB Living  3 2 2  0 0 2  SAB IAB Ectopic Multiple Live Births  0 0 0 0 2    # Outcome Date GA Lbr Len/2nd Weight Sex Delivery Anes PTL Lv  3 Gravida           2 Term 11/04/18 [redacted]w[redacted]d / 00:31 8 lb 9 oz (3.884 kg) M Vag-Spont EPI N LIV  1 Term 06/24/15 [redacted]w[redacted]d 10:50 / 01:39 8 lb 4.6 oz (3.76 kg) M Vag-Spont EPI  LIV    Past Medical History:  Diagnosis Date   Cervical dysplasia, moderate 11/2021   Frequent headaches    History of fainting spells of unknown cause    Menstrual irregularity    Migraines    Placenta previa antepartum in second trimester 05/07/2018   Positive 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   No current facility-administered medications on file prior to visit.    No Known  Allergies  Social History:  reports that she has never smoked. She has never used smokeless tobacco. She reports current alcohol use. She reports that she does not use drugs.  Family History  Problem Relation Age of Onset   Healthy Mother        heavy menses; early hysterectomy   High blood pressure Father    Alcohol abuse Maternal Grandfather        colon   Diabetes Paternal Grandmother    Colon cancer Maternal Aunt 67    The following portions of the patient's history were reviewed and updated as appropriate: allergies, current medications, past family history, past medical history, past social history, past surgical history and problem list.  Review of Systems Pertinent items noted in HPI and remainder of comprehensive ROS otherwise negative.  Physical Exam:  BP 125/79   Pulse 81   Resp 16   Ht 5\' 9"  (1.753 m)   Wt 157 lb 11.2 oz (71.5 kg)   LMP 10/02/2022 (Exact Date)   BMI 23.29 kg/m  CONSTITUTIONAL: Well-developed, well-nourished female in no acute distress.  HENT:  Normocephalic, atraumatic, External right and left ear normal. Oropharynx is clear and moist EYES: Conjunctivae and EOM are normal. Pupils are equal, round, and reactive to light. No scleral icterus.  NECK: Normal range of motion, supple, no masses.  Normal thyroid.  SKIN: Skin is warm  and dry. No rash noted. Not diaphoretic. No erythema. No pallor. MUSCULOSKELETAL: Normal range of motion. No tenderness.  No cyanosis, clubbing, or edema.  2+ distal pulses. NEUROLOGIC: Alert and oriented to person, place, and time. Normal reflexes, muscle tone coordination.  PSYCHIATRIC: Normal mood and affect. Normal behavior. Normal judgment and thought content. CARDIOVASCULAR: Normal heart rate noted, regular rhythm RESPIRATORY: Clear to auscultation bilaterally. Effort and breath sounds normal, no problems with respiration noted. BREASTS: Symmetric in size. No masses, tenderness, skin changes, nipple drainage, or  lymphadenopathy bilaterally.  ABDOMEN: Soft, no distention noted.  No tenderness, rebound or guarding.  PELVIC: Normal appearing external genitalia and urethral meatus; normal appearing vaginal mucosa and cervix.  No abnormal discharge noted.  Pap smear obtained.  Normal uterine size, no other palpable masses, no uterine or adnexal tenderness.  .   Assessment and Plan:    1. Encounter for annual routine gynecological examination   Pap: Will follow up results of pap smear and manage accordingly. Mammogram : n/a  Labs: none  Refills: none Referral: none  Routine preventative health maintenance measures emphasized. Please refer to After Visit Summary for other counseling recommendations.      Doreene Burke, CNM [facility]  [facility],  [facility]

## 2022-10-27 NOTE — Patient Instructions (Signed)

## 2022-10-30 LAB — CYTOLOGY - PAP
Comment: NEGATIVE
High risk HPV: NEGATIVE

## 2022-11-11 ENCOUNTER — Encounter: Payer: Self-pay | Admitting: Certified Nurse Midwife

## 2022-11-14 ENCOUNTER — Emergency Department (HOSPITAL_COMMUNITY)
Admission: EM | Admit: 2022-11-14 | Discharge: 2022-11-14 | Disposition: A | Discharge status: Home / Self Care | Payer: 59 | Attending: Emergency Medicine | Admitting: Emergency Medicine

## 2022-11-14 ENCOUNTER — Emergency Department (HOSPITAL_COMMUNITY): Payer: 59

## 2022-11-14 DIAGNOSIS — R109 Unspecified abdominal pain: Secondary | ICD-10-CM | POA: Diagnosis not present

## 2022-11-14 DIAGNOSIS — R0789 Other chest pain: Secondary | ICD-10-CM

## 2022-11-14 DIAGNOSIS — K824 Cholesterolosis of gallbladder: Secondary | ICD-10-CM | POA: Diagnosis not present

## 2022-11-14 DIAGNOSIS — R079 Chest pain, unspecified: Secondary | ICD-10-CM | POA: Diagnosis not present

## 2022-11-14 LAB — BASIC METABOLIC PANEL
Anion gap: 11 (ref 5–15)
BUN: 11 mg/dL (ref 6–20)
CO2: 19 mmol/L — ABNORMAL LOW (ref 22–32)
Calcium: 8.8 mg/dL — ABNORMAL LOW (ref 8.9–10.3)
Chloride: 106 mmol/L (ref 98–111)
Creatinine, Ser: 0.73 mg/dL (ref 0.44–1.00)
GFR, Estimated: >60 mL/min (ref >60)
Glucose, Bld: 88 mg/dL (ref 70–99)
Potassium: 3.4 mmol/L — ABNORMAL LOW (ref 3.5–5.1)
Sodium: 136 mmol/L (ref 135–145)

## 2022-11-14 LAB — CBC
HCT: 38.5 % (ref 36.0–46.0)
Hemoglobin: 13.1 g/dL (ref 12.0–15.0)
MCH: 29.3 pg (ref 26.0–34.0)
MCHC: 34 g/dL (ref 30.0–36.0)
MCV: 86.1 fL (ref 80.0–100.0)
Platelets: 209 10*3/uL (ref 150–400)
RBC: 4.47 MIL/uL (ref 3.87–5.11)
RDW: 12.4 % (ref 11.5–15.5)
WBC: 4.3 10*3/uL (ref 4.0–10.5)
nRBC: 0 % (ref 0.0–0.2)

## 2022-11-14 LAB — HEPATIC FUNCTION PANEL
ALT: 25 U/L (ref 0–44)
AST: 21 U/L (ref 15–41)
Albumin: 4.2 g/dL (ref 3.5–5.0)
Alkaline Phosphatase: 59 U/L (ref 38–126)
Bilirubin, Direct: 0.1 mg/dL (ref 0.0–0.2)
Indirect Bilirubin: 0.7 mg/dL (ref 0.3–0.9)
Total Bilirubin: 0.8 mg/dL (ref 0.3–1.2)
Total Protein: 6.8 g/dL (ref 6.5–8.1)

## 2022-11-14 LAB — I-STAT BETA HCG BLOOD, ED (MC, WL, AP ONLY): I-stat hCG, quantitative: <5 m[IU]/mL (ref <5)

## 2022-11-14 LAB — D-DIMER, QUANTITATIVE: D-Dimer, Quant: <0.27 ug/mL-FEU (ref 0.00–0.50)

## 2022-11-14 LAB — TROPONIN I (HIGH SENSITIVITY)
Troponin I (High Sensitivity): <2 ng/L (ref <18)
Troponin I (High Sensitivity): 3 ng/L (ref <18)

## 2022-11-14 NOTE — Discharge Instructions (Addendum)
Repeat ultrasound of the gallbladder is recommended in 6 months.

## 2022-11-14 NOTE — ED Triage Notes (Signed)
Pt states for the last three weeks she's had intermittent midsternal CP described as "heaviness and squeezing" radiating up to the R side of her neck. Pt denies SOB. Hx of SVT.

## 2022-11-14 NOTE — ED Provider Notes (Signed)
[place] EMERGENCY DEPARTMENT AT [facility] Provider Note   CSN: [id number] Arrival date & time: 11/14/22  1010     History  Chief Complaint  Patient presents with   Chest Pain    Janet Thompson is a 32 y.o. female.  Pt is a 32 yo female with pmhx significant for SVT, cervical dysplasia and placenta previa.  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  She has been having intermittent cp that feels like a heavy, squeezing sensation.  None now.  She had an episode on the way to work and called her doctor to try to schedule an appt, but they told her to be seen in the ED.  Pt did see cards for SVT in 2021 and was given a rx for metoprolol.  She never took it and has not had any more episodes of SVT.         Home Medications Prior to Admission medications   Not on File      Allergies    Patient has no known allergies.    Review of Systems   Review of Systems  Cardiovascular:  Positive for chest pain.  All other systems reviewed and are negative.   Physical Exam Updated Vital Signs BP 119/75   Pulse 78   Temp 98.9 F (37.2 C) (Oral)   Resp 19   LMP 10/02/2022 (Exact Date)   SpO2 99%  Physical Exam Vitals and nursing note reviewed.  Constitutional:      Appearance: She is well-developed.  HENT:     Head: Normocephalic and atraumatic.  Eyes:     Extraocular Movements: Extraocular movements intact.     Pupils: Pupils are equal, round, and reactive to light.  Cardiovascular:     Rate and Rhythm: Normal rate and regular rhythm.     Heart sounds: Normal heart sounds.  Pulmonary:     Effort: Pulmonary effort is normal.     Breath sounds: Normal breath sounds.  Abdominal:     General: Bowel sounds are normal.     Palpations: Abdomen is soft.  Musculoskeletal:        General: Normal range of motion.     Cervical back: Normal range of motion and neck supple.  Skin:    General: Skin is warm.     Capillary Refill: Capillary refill takes less than 2  seconds.  Neurological:     General: No focal deficit present.     Mental Status: She is alert and oriented to person, place, and time.  Psychiatric:        Mood and Affect: Mood normal.        Behavior: Behavior normal.     ED Results / Procedures / Treatments   Labs (all labs ordered are listed, but only abnormal results are displayed) Labs Reviewed  BASIC METABOLIC PANEL - Abnormal; Notable for the following components:      Result Value   Potassium 3.4 (*)    CO2 19 (*)    Calcium 8.8 (*)    All other components within normal limits  CBC  HEPATIC FUNCTION PANEL  D-DIMER, QUANTITATIVE  I-STAT BETA HCG BLOOD, ED ([facility], [facility], [facility] ONLY)  TROPONIN I (HIGH SENSITIVITY)  TROPONIN I (HIGH SENSITIVITY)    EKG EKG Interpretation  Date/Time:  Friday Nov 14 2022 10:18:56 EDT Ventricular Rate:  81 PR Interval:  117 QRS Duration: 94 QT Interval:  371 QTC Calculation: 431 R Axis:   79 Text  Interpretation: Sinus rhythm Borderline short PR interval RSR' in V1 or V2, right VCD or RVH Since last tracing rate slower Confirmed by Jacalyn Lefevre [phone]) on 11/14/2022 10:48:30 AM  Radiology US Abdomen Limited RUQ (LIVER/GB)  Result Date: 11/14/2022 CLINICAL DATA:  Pain EXAM: ULTRASOUND ABDOMEN LIMITED RIGHT UPPER QUADRANT COMPARISON:  None available FINDINGS: Gallbladder: Gallstones: None Sludge: None Gallbladder Wall: Within normal limits Pericholecystic fluid: None Sonographic Murphy's Sign: Negative per technologist Non-mobile, echogenic structure measuring 4 and 6 mm, appear adherent to the gallbladder wall most likely represent polyp or tumefactive sludge. Common bile duct: Diameter: 4 mm Liver: Parenchymal echogenicity: Within normal limits Contours: Normal Lesions: None Portal vein: Patent.  Hepatopetal flow Other: None. IMPRESSION: 1. No acute abnormality of the liver or gallbladder. 2. Multiple non mobile echogenic structures measuring up to 6 mm are likely polyps or tumefactive sludge. A  follow-up ultrasound is recommended in 6 months to document stability. Electronically Signed   By: Acquanetta Belling M.D.   On: 11/14/2022 12:52   DG Chest Port 1 View  Result Date: 11/14/2022 CLINICAL DATA:  Chest pain. EXAM: PORTABLE CHEST 1 VIEW COMPARISON:  April 13, 2014. FINDINGS: The heart size and mediastinal contours are within normal limits. Both lungs are clear. The visualized skeletal structures are unremarkable. IMPRESSION: No active disease. Electronically Signed   By: Lupita Raider M.D.   On: 11/14/2022 10:50    Procedures Procedures    Medications Ordered in ED Medications - No data to display  ED Course/ Medical Decision Making/ A&P                             Medical Decision Making Amount and/or Complexity of Data Reviewed Labs: ordered. Radiology: ordered.   This patient presents to the ED for concern of cp, this involves an extensive number of treatment options, and is a complaint that carries with it a high risk of complications and morbidity.  The differential diagnosis includes cardiac, pulm, gi   Co morbidities that complicate the patient evaluation  SVT, cervical dysplasia and placenta previa   Additional history obtained:  Additional history obtained from epic chart review  Lab Tests:  I Ordered, and personally interpreted labs.  The pertinent results include:  preg neg, cbc nl, bmp and lfts nl, trop neg times 2, ddimer neg   Imaging Studies ordered:  I ordered imaging studies including cxr and gb US I independently visualized and interpreted imaging which showed  CXR: No active disease.  GB US:  No acute abnormality of the liver or gallbladder.  2. Multiple non mobile echogenic structures measuring up to 6 mm are  likely polyps or tumefactive sludge. A follow-up ultrasound is  recommended in 6 months to document stability.   I agree with the radiologist interpretation   Cardiac Monitoring:  The patient was maintained on a cardiac  monitor.  I personally viewed and interpreted the cardiac monitored which showed an underlying rhythm of: nsr   Medicines ordered and prescription drug management:   I have reviewed the patients home medicines and have made adjustments as needed   Test Considered:  [place]  Problem List / ED Course:  CP:  heart score of zero.  2 neg troponins.  Ddimer neg.  Gb US with polyps and sludge.  This could possibly be causing pain.  Pt told to eat a gb diet and get a repeat US in 6 months to ensure stability of  polyps.  Return if worse.  F/u with pcp.   Reevaluation:  After the interventions noted above, I reevaluated the patient and found that they have :improved   Social Determinants of Health:  Lives at home   Dispostion:  After consideration of the diagnostic results and the patients response to treatment, I feel that the patent would benefit from discharge with outpatient f/u.          Final Clinical Impression(s) / ED Diagnoses Final diagnoses:  Atypical chest pain  Gallbladder polyp    Rx / DC Orders ED Discharge Orders     None         Jacalyn Lefevre, MD 11/14/22 1414

## 2022-11-28 ENCOUNTER — Ambulatory Visit (INDEPENDENT_AMBULATORY_CARE_PROVIDER_SITE_OTHER): Payer: 59 | Admitting: Family Medicine

## 2022-11-28 ENCOUNTER — Encounter: Payer: Self-pay | Admitting: Family Medicine

## 2022-11-28 VITALS — BP 122/90 | HR 80 | Temp 98.1°F | Ht 69.0 in | Wt 155.6 lb

## 2022-11-28 DIAGNOSIS — G43809 Other migraine, not intractable, without status migrainosus: Secondary | ICD-10-CM

## 2022-11-28 DIAGNOSIS — K824 Cholesterolosis of gallbladder: Secondary | ICD-10-CM | POA: Diagnosis not present

## 2022-11-28 MED ORDER — SUMATRIPTAN SUCCINATE 100 MG PO TABS: 100.0000 mg | ORAL_TABLET | Freq: Every day | ORAL | 2 refills | Status: DC | PRN

## 2022-11-28 NOTE — Progress Notes (Unsigned)
   Acute Office Visit  Subjective:     Patient ID: Janet Thompson, female    DOB: [birth year], 32 y.o.   MRN: [id number]  Chief Complaint  Patient presents with   Chest Pain    Patient complains of chest tightness intermittently x2 weeks, seen in the ER 1 week ago and states each test related to the heart was negative, told gallbladder polyps were present   Migraine    Patient complains of a severe headache she feels was a migraine approximately 2 weeks ago, which she felt resolved after discontinuing birth control pills, requests to discuss preventative Rx for migraines    Chest Pain  Associated symptoms include nausea. Pertinent negatives include no diaphoresis, fever, malaise/fatigue or shortness of breath.  Migraine  Associated symptoms include nausea. Pertinent negatives include no fever or weight loss.   Patient is in today for chest discomfort-- states it has been doing on for a couple of weeks, went to the ED on 5/17 for the same issues. States that it was a squeezing sensation with sharp pain as well. States that she had a work up there which was unremarkable, she did have a GB US. The pain is not associated with anything, states that it is relatively constant, occasional squeezing. States that palpating the chest does not reproduce the pain. States that the first week she felt nauseated but this has resolved, states at the time she was having watery BM's for a few days. These have resolved, however the chest pain  Review of Systems  Constitutional:  Negative for chills, diaphoresis, fever, malaise/fatigue and weight loss.  Respiratory:  Negative for shortness of breath.   Cardiovascular:  Positive for chest pain.  Gastrointestinal:  Positive for diarrhea and nausea. Negative for heartburn.        Objective:    BP (!) 122/90 (BP Location: Right Arm, Patient Position: Sitting, Cuff Size: Normal)   Pulse 80   Temp 98.1 F (36.7 C) (Oral)   Ht 5\' 9"  (1.753 m)   Wt 155 lb  9.6 oz (70.6 kg)   LMP 11/23/2022 (Exact Date)   SpO2 99%   BMI 22.98 kg/m  {Vitals History (Optional):23777}  Physical Exam  No results found for any visits on 11/28/22.      Assessment & Plan:   Problem List Items Addressed This Visit       Unprioritized   Migraines   Relevant Medications   SUMAtriptan (IMITREX) 100 MG tablet   Other Visit Diagnoses     Gallbladder polyp    -  Primary   Relevant Orders   US Abdomen Limited RUQ (LIVER/GB)       Meds ordered this encounter  Medications   SUMAtriptan (IMITREX) 100 MG tablet    Sig: Take 1 tablet (100 mg total) by mouth daily as needed for migraine.    Dispense:  10 tablet    Refill:  2    No follow-ups on file.  Karie Georges, MD

## 2022-12-27 NOTE — Progress Notes (Unsigned)
Cardiology Clinic Note   Patient Name: Janet Thompson Date of Encounter: 12/30/2022  Primary Care Provider:  Karie Georges, MD Primary Cardiologist:  Thurmon Fair, MD  Patient Profile    Janet Thompson 32 year old female presents to the clinic today for follow-up evaluation of palpitations.  Past Medical History    Past Medical History:  Diagnosis Date   Cervical dysplasia, moderate 11/2021   Frequent headaches    History of fainting spells of unknown cause    Menstrual irregularity    Migraines    Placenta previa antepartum in second trimester 05/07/2018   Positive QuantiFERON-TB Gold test    Past Surgical History:  Procedure Laterality Date   LEEP N/A 12/30/2021   Procedure: LOOP ELECTROSURGICAL EXCISION PROCEDURE (LEEP);  Surgeon: Hildred Laser, MD;  Location: [facility] ORS;  Service: Gynecology;  Laterality: N/A;   WISDOM TOOTH EXTRACTION      Allergies  No Known Allergies  History of Present Illness    She has past medical history of palpitations, frequent headaches/migraines, fainting spells of unknown cause, menstrual irregularities, placenta previa, and positive QuantiFERON gold test.   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  She was placed on cardiac monitor by coworkers and noted to have a heart rate of 170 bpm.  She then presented to the emergency room and denied symptoms of presyncope, syncope, dyspnea, angina, but was noted to have unpleasant palpitations.  She also felt very tired.  While on telemetry in the emergency room she was noted to have an abrupt cessation of tachycardia and what appeared to be a PAC with distinct P wave.  Subsequently she converted normal sinus rhythm 70 bpm.  Her EKG during tachycardia showed a retrograde P wave on the terminal segment of the S in multiple leads.  She indicated that she had several episodes over the years dating back to when she was a teenager.  She indicated that  they usually terminate spontaneously with them just a few minutes.  She stated that recently she had an episode around 4 weeks ago.   She also indicated that she has had numerous syncopal events dating back to her childhood.  These were brought on by standing up too quickly.  She indicated that she would feel better with immediately laying back down.  Her husband has witnessed these events and confirmed that they were brief in nature.  These events were not associated with palpitations.  After 2 pregnancies and gaining some weight episodes of syncope had reduced in frequency however not completely resolved.  She has no known structural heart disease.  She was told during one of her pregnancies that she had a heart murmur.  Of note she has occasional migraine headaches which she treats with sumatriptan.  The medication has not triggered tachycardia or syncope.   She was seen virtually 02/20/20 for follow-up evaluation and stated she was reluctant to start metoprolol medication as prescribed because she was not sure if she needed it or not.  She  had 1 brief episode of accelerated heart rate since her visit to the emergency department.  She presented to her PCPs office who encouraged her to fill her prescription of metoprolol.  She described palpitations 1-2 times per month that lasted for minutes before converting back to regular rate/rhythm.  She stated she maintained a physically active lifestyle and followed a healthy diet.  Her other concern with taking daily metoprolol was increased fatigue.  Her echocardiogram was reviewed and she had no questions at the time.  I prescribed metoprolol tartrate 12.5 mg twice daily as needed and planned follow-up with Dr. Salena Saner in 6 months.  She presents to the clinic today for follow-up evaluation and states she had a 2-week period of chest discomfort which she describes as tightness.  The tightness was consistent.  It radiated to her neck.  She followed up with her PCP who  directed her to the emergency department.  Her blood work was unremarkable.  She had a gallbladder ultrasound which showed no acute abnormality in her liver or gallbladder and multiple nonmobile echogenic structures measuring up to 6 mm.  Follow-up in 6 months was recommended to document stability.  She denies exertional chest discomfort.  We reviewed options for further prognostication.  She wishes to defer coronary calcium scoring at this time.  I will repeat echocardiogram for further prognostication and have her follow-up as needed.   Today she denies chest pain, shortness of breath, lower extremity edema, fatigue, palpitations, melena, hematuria, hemoptysis, diaphoresis, weakness, presyncope, syncope, orthopnea, and PND.      Home Medications    Prior to Admission medications   Medication Sig Start Date End Date Taking? Authorizing Provider  SUMAtriptan (IMITREX) 100 MG tablet Take 1 tablet (100 mg total) by mouth daily as needed for migraine. 11/28/22   Karie Georges, MD    Family History    Family History  Problem Relation Age of Onset   Healthy Mother        heavy menses; early hysterectomy   High blood pressure Father    Alcohol abuse Maternal Grandfather        colon   Diabetes Paternal Grandmother    Colon cancer Maternal Aunt 50   She indicated that her mother is alive. She indicated that her father is alive. She indicated that both of her sisters are alive. She indicated that her maternal grandmother is deceased. She indicated that her maternal grandfather is deceased. She indicated that her paternal grandmother is deceased. She indicated that her paternal grandfather is deceased. She indicated that her maternal aunt is alive.  Social History    Social History   Socioeconomic History   Marital status: Married    Spouse name: John   Number of children: 3   Years of education: Not on file   Highest education level: Bachelor's degree (e.g., BA, AB, BS)   Occupational History   Not on file  Tobacco Use   Smoking status: Never   Smokeless tobacco: Never  Vaping Use   Vaping Use: Never used  Substance and Sexual Activity   Alcohol use: Yes    Comment: occasional   Drug use: No   Sexual activity: Yes    Birth control/protection: None  Other Topics Concern   Not on file  Social History Narrative   Work or School: RN pediatrics [employer]   Social Determinants of Health   Financial Resource Strain: Low Risk  (11/27/2022)   Overall Financial Resource Strain (CARDIA)    Difficulty of Paying Living Expenses: Not hard at all  Food Insecurity: No Food Insecurity (11/27/2022)   Hunger Vital Sign    Worried About Running Out of Food in the Last Year: Never true    Ran Out of Food in the Last Year: Never true  Transportation Needs: No Transportation Needs (11/27/2022)   PRAPARE - Transportation    Lack of Transportation (Medical): No    Lack of  Transportation (Non-Medical): No  Physical Activity: Sufficiently Active (11/27/2022)   Exercise Vital Sign    Days of Exercise per Week: 5 days    Minutes of Exercise per Session: 60 min  Stress: No Stress Concern Present (11/27/2022)   [employer] of Occupational Health - Occupational Stress Questionnaire    Feeling of Stress : Not at all  Social Connections: Socially Integrated (11/27/2022)   Social Connection and Isolation Panel [NHANES]    Frequency of Communication with Friends and Family: More than three times a week    Frequency of Social Gatherings with Friends and Family: Three times a week    Attends Religious Services: More than 4 times per year    Active Member of Clubs or Organizations: Yes    Attends Banker Meetings: More than 4 times per year    Marital Status: Married  Catering manager Violence: Not on file     Review of Systems    General:  No chills, fever, night sweats or weight changes.  Cardiovascular:  No chest pain, dyspnea on exertion, edema,  orthopnea, palpitations, paroxysmal nocturnal dyspnea. Dermatological: No rash, lesions/masses Respiratory: No cough, dyspnea Urologic: No hematuria, dysuria Abdominal:   No nausea, vomiting, diarrhea, bright red blood per rectum, melena, or hematemesis Neurologic:  No visual changes, wkns, changes in mental status. All other systems reviewed and are otherwise negative except as noted above.  Physical Exam    VS:  BP 112/78 (BP Location: Left Arm, Patient Position: Sitting, Cuff Size: Normal)   Pulse 82   Ht 5\' 9"  (1.753 m)   Wt 160 lb (72.6 kg)   SpO2 99%   BMI 23.63 kg/m  , BMI Body mass index is 23.63 kg/m. GEN: Well nourished, well developed, in no acute distress. HEENT: normal. Neck: Supple, no JVD, carotid bruits, or masses. Cardiac: RRR, no murmurs, rubs, or gallops. No clubbing, cyanosis, edema.  Radials/DP/PT 2+ and equal bilaterally.  Respiratory:  Respirations regular and unlabored, clear to auscultation bilaterally. GI: Soft, nontender, nondistended, BS + x 4. MS: no deformity or atrophy. Skin: warm and dry, no rash. Neuro:  Strength and sensation are intact. Psych: Normal affect.  Accessory Clinical Findings    Recent Labs: 11/14/2022: ALT 25; BUN 11; Creatinine, Ser 0.73; Hemoglobin 13.1; Platelets 209; Potassium 3.4; Sodium 136   Recent Lipid Panel    Component Value Date/Time   CHOL 149 03/21/2019 1244   TRIG 73.0 03/21/2019 1244   HDL 68.60 03/21/2019 1244   CHOLHDL 2 03/21/2019 1244   VLDL 14.6 03/21/2019 1244   LDLCALC 66 03/21/2019 1244         ECG personally reviewed by me today-none today.  Echocardiogram 02/13/2020 FINDINGS   Left Ventricle: Left ventricular ejection fraction, by estimation, is 55  to 60%. The left ventricle has normal function. The left ventricle has no  regional wall motion abnormalities. The left ventricular internal cavity  size was normal in size. There is   no left ventricular hypertrophy. Left ventricular diastolic  parameters  were normal.   Right Ventricle: The right ventricular size is normal. No increase in  right ventricular wall thickness. Right ventricular systolic function is  normal.   Left Atrium: Left atrial size was normal in size.   Right Atrium: Right atrial size was normal in size.   Pericardium: There is no evidence of pericardial effusion.   Mitral Valve: The mitral valve is normal in structure. Normal mobility of  the mitral valve leaflets. No evidence  of mitral valve regurgitation. No  evidence of mitral valve stenosis.   Tricuspid Valve: The tricuspid valve is normal in structure. Tricuspid  valve regurgitation is not demonstrated. No evidence of tricuspid  stenosis.   Aortic Valve: The aortic valve was not well visualized. Aortic valve  regurgitation is not visualized. No aortic stenosis is present.   Pulmonic Valve: The pulmonic valve was normal in structure. Pulmonic valve  regurgitation is not visualized. No evidence of pulmonic stenosis.   Aorta: The aortic root is normal in size and structure.   IAS/Shunts: No atrial level shunt detected by color flow Doppler.     Assessment & Plan   1.  Chest discomfort-reports a 2-week period of chest discomfort in May of this year.  She was seen in the emergency department and her labs were unremarkable.  She describes her discomfort as pressure that radiated to her neck.  She had a ultrasound of her gallbladder 11/14/2022 which showed no acute abnormalities of the liver or gallbladder and multiple nonmobile echogenic structure measuring up to 6 mm.  Follow-up ultrasound was recommended in 6 months to document stability.  Denies exertional chest discomfort.  Her chest discomfort is atypical.  Offered coronary calcium scoring.  Used shared decision making to defer at this time. Repeat echocardiogram for further prognostication.  Palpitations/SVT-heart rate today 82 bpm.  Denies recent episodes of accelerated or irregular  heartbeat.   Previously seen in the emergency department for consultation 01/26/2020 after having an episode of palpitations at work.  Coworkers placed her on cardiac monitor which showed a heart rate of 170 bpm.  She presented to the emergency department and converted back to sinus rhythm.  She has a long history of tachypalpitations dating back to her childhood.  She was prescribed metoprolol succinate 25 mg daily and referred to EP to discuss ablation options. Did not use metoprolol Avoid triggers caffeine, chocolate, EtOH etc.-reviewed   Mitral valve prolapse-previously told during one of her pregnancies that she had cardiac murmur.  Prior echocardiogram showed no significant valvular abnormalities EF 55-60%     Vasovagal presyncope-resolved.  Previously had frequent episodes in adolescence which continued through her teen years and into her 19s.  Previously noted an increase in the incidence between 53 and 20 she was doing intense athletic training.  Have not been as common recently.   Reviewed need for increased p.o. fluids and sodium in diet.  Maintain p.o. fluid intake      Disposition: Follow-up with Dr. Royann Shivers follow up prn.   Thomasene Ripple. Jalonda Antigua NP-C     12/30/2022, 3:51 PM [facility] [address] Office [phone] Fax [phone]    I spent 13 minutes examining this patient, reviewing medications, and using patient centered shared decision making involving her cardiac care.  Prior to her visit I spent greater than 20 minutes reviewing her past medical history,  medications, and prior cardiac tests.

## 2022-12-30 ENCOUNTER — Encounter: Payer: Self-pay | Admitting: General Practice

## 2022-12-30 ENCOUNTER — Ambulatory Visit: Payer: 59 | Attending: General Practice | Admitting: General Practice

## 2022-12-30 VITALS — BP 112/78 | HR 82 | Ht 69.0 in | Wt 160.0 lb

## 2022-12-30 DIAGNOSIS — R55 Syncope and collapse: Secondary | ICD-10-CM | POA: Diagnosis not present

## 2022-12-30 DIAGNOSIS — I341 Nonrheumatic mitral (valve) prolapse: Secondary | ICD-10-CM | POA: Diagnosis not present

## 2022-12-30 DIAGNOSIS — R002 Palpitations: Secondary | ICD-10-CM | POA: Diagnosis not present

## 2022-12-30 NOTE — Patient Instructions (Addendum)
Medication Instructions:  NO CHANGES *If you need a refill on your cardiac medications before your next appointment, please call your pharmacy*   Lab Work: NONE ORDERED If you have labs (blood work) drawn today and your tests are completely normal, you will receive your results only by: MyChart Message (if you have MyChart) OR A paper copy in the mail If you have any lab test that is abnormal or we need to change your treatment, we will call you to review the results.   Testing/Procedures: Your physician has requested that you have an echocardiogram. Echocardiography is a painless test that uses sound waves to create images of your heart. It provides your doctor with information about the size and shape of your heart and how well your heart's chambers and valves are working. This procedure takes approximately one hour. There are no restrictions for this procedure. Please do NOT wear cologne, perfume, aftershave, or lotions (deodorant is allowed). Please arrive 15 minutes prior to your appointment time.    Follow-Up: At [facility], you and your health needs are our priority.  As part of our continuing mission to provide you with exceptional heart care, we have created designated Provider Care Teams.  These Care Teams include your primary Cardiologist (physician) and Advanced Practice Providers (APPs -  Physician Assistants and Nurse Practitioners) who all work together to provide you with the care you need, when you need it.    Your next appointment:    PENDING RESULTS OF ECHOCARDIOGRAM  Provider:   Edd Fabian, FNP        Other Instructions MAINTAIN PHYSICAL ACTIVITY AND HEART HEALTHY DIET

## 2023-02-06 ENCOUNTER — Ambulatory Visit (HOSPITAL_COMMUNITY): Payer: 59 | Attending: Cardiology

## 2023-02-06 DIAGNOSIS — I341 Nonrheumatic mitral (valve) prolapse: Secondary | ICD-10-CM | POA: Insufficient documentation

## 2023-02-06 LAB — ECHOCARDIOGRAM COMPLETE
Area-P 1/2: 3.72 cm2
S' Lateral: 2.8 cm

## 2023-04-29 ENCOUNTER — Other Ambulatory Visit (HOSPITAL_COMMUNITY)
Admission: RE | Admit: 2023-04-29 | Discharge: 2023-04-29 | Disposition: A | Discharge status: Home / Self Care | Payer: 59 | Source: Ambulatory Visit | Attending: Certified Nurse Midwife | Admitting: Certified Nurse Midwife

## 2023-04-29 ENCOUNTER — Encounter: Payer: Self-pay | Admitting: Certified Nurse Midwife

## 2023-04-29 ENCOUNTER — Ambulatory Visit: Payer: 59 | Admitting: Certified Nurse Midwife

## 2023-04-29 VITALS — BP 127/82 | HR 76 | Wt 155.1 lb

## 2023-04-29 DIAGNOSIS — Z124 Encounter for screening for malignant neoplasm of cervix: Secondary | ICD-10-CM | POA: Diagnosis present

## 2023-04-29 DIAGNOSIS — R87619 Unspecified abnormal cytological findings in specimens from cervix uteri: Secondary | ICD-10-CM

## 2023-04-29 NOTE — Progress Notes (Signed)
GYN ENCOUNTER NOTE  Subjective:       Janet Thompson is a 32 y.o. G84P2002 female is here for gynecologic evaluation of the following issues:  1. Repeat pap smear, pt seen earlier this year for annual at that time pap completed results  Atypical endocervical cells, NOS Abnormal   2. Pt state she feels a bulging on her right vaginal wall. She denies pain    Gynecologic History Patient's last menstrual period was 04/07/2023 (exact date). Contraception: vasectomy Last Pap: 10/27/2022. Results were: abnormal  Obstetric History OB History  Gravida Para Term Preterm AB Living  3 2 2  0 0 2  SAB IAB Ectopic Multiple Live Births  0 0 0 0 2    # Outcome Date GA Lbr Len/2nd Weight Sex Type Anes PTL Lv  3 Gravida           2 Term 11/04/18 [redacted]w[redacted]d / 00:31 8 lb 9 oz (3.884 kg) M Vag-Spont EPI N LIV  1 Term 06/24/15 [redacted]w[redacted]d 10:50 / 01:39 8 lb 4.6 oz (3.76 kg) M Vag-Spont EPI  LIV    Past Medical History:  Diagnosis Date   Cervical dysplasia, moderate 11/2021   Frequent headaches    History of fainting spells of unknown cause    Menstrual irregularity    Migraines    Placenta previa antepartum in second trimester 05/07/2018   Positive QuantiFERON-TB Gold test     Past Surgical History:  Procedure Laterality Date   LEEP N/A 12/30/2021   Procedure: LOOP ELECTROSURGICAL EXCISION PROCEDURE (LEEP);  Surgeon: Hildred Laser, MD;  Location: [facility] ORS;  Service: Gynecology;  Laterality: N/A;   WISDOM TOOTH EXTRACTION      Current Outpatient Medications on File Prior to Visit  Medication Sig Dispense Refill   SUMAtriptan (IMITREX) 100 MG tablet Take 1 tablet (100 mg total) by mouth daily as needed for migraine. 10 tablet 2   No current facility-administered medications on file prior to visit.    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[employer]   Social Determinants of Health   Financial Resource Strain: Low Risk  (11/27/2022)   Overall Financial Resource Strain (CARDIA)    Difficulty of Paying Living Expenses: Not hard at all  Food Insecurity: No Food Insecurity (11/27/2022)   Hunger Vital Sign    Worried About Running Out of Food in the Last Year: Never true    Ran Out of Food in the Last Year: Never true  Transportation Needs: No Transportation Needs (11/27/2022)   PRAPARE - Administrator, Civil Service (Medical): No    Lack of Transportation (Non-Medical): No  Physical Activity: Sufficiently Active (11/27/2022)   Exercise Vital Sign    Days of Exercise per Week: 5 days    Minutes of Exercise per  Session: 60 min  Stress: No Stress Concern Present (11/27/2022)   [employer] of Occupational Health - Occupational Stress Questionnaire    Feeling of Stress : Not at all  Social Connections: Socially Integrated (11/27/2022)   Social Connection and Isolation Panel [NHANES]    Frequency of Communication with Friends and Family: More than three times a week    Frequency of Social Gatherings with Friends and Family: Three times a week    Attends Religious Services: More than 4 times per year    Active Member of Clubs or Organizations: Yes    Attends Engineer, structural: More than 4 times per year    Marital Status: Married  Catering manager Violence: Not on file    Family History  Problem Relation Age of Onset   Healthy Mother        heavy menses; early hysterectomy    High blood pressure Father    Alcohol abuse Maternal Grandfather        colon   Diabetes Paternal Grandmother    Colon cancer Maternal Aunt 50    The following portions of the patient's history were reviewed and updated as appropriate: allergies, current medications, past family history, past medical history, past social history, past surgical history and problem list.  Review of Systems Review of Systems - Negative except as mentioned in HPI Review of Systems - General ROS: negative for - chills, fatigue, fever, hot flashes, malaise or night sweats Hematological and Lymphatic ROS: negative for - bleeding problems or swollen lymph nodes Gastrointestinal ROS: negative for - abdominal pain, blood in stools, change in bowel habits and nausea/vomiting Musculoskeletal ROS: negative for - joint pain, muscle pain or muscular weakness Genito-Urinary ROS: negative for - change in menstrual cycle, dysmenorrhea, dyspareunia, dysuria, genital discharge, genital ulcers, hematuria, incontinence, irregular/heavy menses, nocturia or pelvic pain.   Objective:   BP 127/82   Pulse 76   Wt 155 lb 1.6 oz (70.4 kg)   LMP 04/07/2023 (Exact Date)   BMI 22.90 kg/m  CONSTITUTIONAL: Well-developed, well-nourished female in no acute distress.  HENT:  Normocephalic, atraumatic.  NECK: Normal range of motion, supple, no masses.  Normal thyroid.  SKIN: Skin is warm and dry. No rash noted. Not diaphoretic. No erythema. No pallor. NEUROLGIC: Alert and oriented to person, place, and time. PSYCHIATRIC: Normal mood and affect. Normal behavior. Normal judgment and thought content. CARDIOVASCULAR:Not Examined RESPIRATORY: Not Examined BREASTS: Not Examined ABDOMEN: Soft, non distended; Non tender.  No Organomegaly. PELVIC:  External Genitalia: Normal  BUS: Normal  Vagina: Normal, no bulging noted on exam , discussed possible varicosities or maybe due to standing being active all day   Cervix:  Normal   MUSCULOSKELETAL: Normal range of motion. No tenderness.  No cyanosis, clubbing, or edema.     Assessment:   1. Encounter for repeat Papanicolaou smear of cervix      Plan:   Pap collected, will follow up with results . Discussed pelvic floor exercise to improve vaginal tone. She verbalizes and agree.   Doreene Burke, CNM

## 2023-05-06 LAB — CYTOLOGY - PAP
Comment: NEGATIVE
Diagnosis: NEGATIVE
Diagnosis: REACTIVE
High risk HPV: NEGATIVE

## 2023-05-07 ENCOUNTER — Encounter: Payer: Self-pay | Admitting: Certified Nurse Midwife

## 2023-05-08 ENCOUNTER — Ambulatory Visit: Payer: 59 | Admitting: Family Medicine

## 2023-05-08 ENCOUNTER — Ambulatory Visit (INDEPENDENT_AMBULATORY_CARE_PROVIDER_SITE_OTHER): Payer: 59 | Admitting: Family Medicine

## 2023-05-08 ENCOUNTER — Encounter: Payer: Self-pay | Admitting: Family Medicine

## 2023-05-08 VITALS — BP 98/70 | HR 88 | Temp 98.2°F | Ht 69.0 in | Wt 154.2 lb

## 2023-05-08 DIAGNOSIS — K824 Cholesterolosis of gallbladder: Secondary | ICD-10-CM

## 2023-05-08 NOTE — Patient Instructions (Signed)
Pepcid AC-- twice daily dosing.

## 2023-05-08 NOTE — Progress Notes (Signed)
   Established Patient Office Visit  Subjective   Patient ID: Janet Thompson, female    DOB: Jul 30, 1990  Age: 32 y.o. MRN: [id number]  Chief Complaint  Patient presents with   Abdominal Pain    Patient complains of recurrent RUQ abdominal pain x2 months, worse past 2 weeks, had nausea and was not feeling well a few weeks ago, suspected virus as her family was sick also    Pt is continuing to have RUQ pain, states that it feels like a dull pain, other days it is more like pressure. She does have a repeat US scheduled in December. States that it is more persistent, not coming and going, not a lot of nausea, no diarrhea, maybe some abdominal bloating.     Current Outpatient Medications  Medication Instructions   SUMAtriptan (IMITREX) 100 mg, Oral, Daily PRN    Patient Active Problem List   Diagnosis Date Noted   CIN III (cervical intraepithelial neoplasia grade III) with severe dysplasia 01/28/2022   Migraines 08/17/2017      Review of Systems  All other systems reviewed and are negative.     Objective:     BP 98/70 (BP Location: Left Arm, Patient Position: Sitting, Cuff Size: Normal)   Pulse 88   Temp 98.2 F (36.8 C) (Oral)   Ht 5\' 9"  (1.753 m)   Wt 154 lb 3.2 oz (69.9 kg)   LMP 04/02/2023 (Exact Date)   SpO2 98%   BMI 22.77 kg/m    Physical Exam Vitals reviewed.  Constitutional:      Appearance: She is well-developed and normal weight.  Pulmonary:     Effort: Pulmonary effort is normal.     Breath sounds: Normal breath sounds.  Abdominal:     General: Abdomen is flat. Bowel sounds are normal. There is no distension.     Palpations: Abdomen is soft.     Tenderness: There is abdominal tenderness in the right upper quadrant. There is no guarding or rebound.  Neurological:     Mental Status: She is alert.      No results found for any visits on 05/08/23.    The ASCVD Risk score (Arnett DK, et al., 2019) failed to calculate for the following reasons:    The 2019 ASCVD risk score is only valid for ages 42 to 43    Assessment & Plan:  Gallbladder polyp -     Ambulatory referral to General Surgery   Pt has repeat US schedule for 12/2. Will ask if it can be moved up to an earlier time. Will also place the referral for her to see the surgeon to discuss removal since her RUQ pain has persisted for many months. RTC in the spring for follow up .   Return in about 6 months (around 11/05/2023) for annual physical exam.    Karie Georges, MD

## 2023-05-11 ENCOUNTER — Ambulatory Visit (HOSPITAL_BASED_OUTPATIENT_CLINIC_OR_DEPARTMENT_OTHER)
Admission: RE | Admit: 2023-05-11 | Discharge: 2023-05-11 | Disposition: A | Discharge status: Home / Self Care | Payer: 59 | Source: Ambulatory Visit | Attending: Family Medicine | Admitting: Family Medicine

## 2023-05-11 DIAGNOSIS — K824 Cholesterolosis of gallbladder: Secondary | ICD-10-CM | POA: Diagnosis not present

## 2023-05-15 DIAGNOSIS — K824 Cholesterolosis of gallbladder: Secondary | ICD-10-CM | POA: Diagnosis not present

## 2023-06-01 ENCOUNTER — Ambulatory Visit (HOSPITAL_BASED_OUTPATIENT_CLINIC_OR_DEPARTMENT_OTHER): Payer: 59

## 2023-06-02 ENCOUNTER — Ambulatory Visit: Payer: 59 | Admitting: Family Medicine

## 2023-07-10 ENCOUNTER — Ambulatory Visit: Payer: Self-pay | Admitting: Surgery

## 2023-07-14 NOTE — Progress Notes (Addendum)
COVID Vaccine received:  []  No [x]  Yes Date of any COVID positive Test in last 90 days: no PCP - Nira Conn MD Cardiologist - Edd Fabian NP  Chest x-ray - 11/14/22 Epic EKG -  11/17/22 Epic Stress Test -  ECHO - 02/06/23 Epic Cardiac Cath -   Bowel Prep - [x]  No  []   Yes ______  Pacemaker / ICD device [x]  No []  Yes   Spinal Cord Stimulator:[x]  No []  Yes       History of Sleep Apnea? [x]  No []  Yes   CPAP used?- [x]  No []  Yes    Does the patient monitor blood sugar?          [x]  No []  Yes  []  N/A  Patient has: [x]  NO Hx DM   []  Pre-DM                 []  DM1  []   DM2 Does patient have a [employer] or Dexacom? []  No []  Yes   Fasting Blood Sugar Ranges-  Checks Blood Sugar _____ times a day  GLP1 agonist / usual dose - no GLP1 instructions:  SGLT-2 inhibitors / usual dose - no SGLT-2 instructions:   Blood Thinner / Instructions:no Aspirin Instructions:no  Comments:   Activity level: Patient is able  to climb a flight of stairs without difficulty; [x]  No CP  [x]  No SOB,  ___   Patient can perform ADLs without assistance.   Anesthesia review: SVT  Patient denies shortness of breath, fever, cough and chest pain at PAT appointment.  Patient verbalized understanding and agreement to the Pre-Surgical Instructions that were given to them at this PAT appointment. Patient was also educated of the need to review these PAT instructions again prior to his/her surgery.I reviewed the appropriate phone numbers to call if they have any and questions or concerns.

## 2023-07-17 ENCOUNTER — Encounter (HOSPITAL_COMMUNITY)
Admission: RE | Admit: 2023-07-17 | Discharge: 2023-07-17 | Disposition: A | Discharge status: Home / Self Care | Payer: 59 | Source: Ambulatory Visit | Attending: Surgery | Admitting: Surgery

## 2023-07-17 ENCOUNTER — Encounter (HOSPITAL_COMMUNITY): Payer: Self-pay

## 2023-07-17 ENCOUNTER — Other Ambulatory Visit: Payer: Self-pay

## 2023-07-17 VITALS — BP 131/92 | HR 86 | Temp 97.8°F | Resp 16 | Ht 69.5 in | Wt 156.0 lb

## 2023-07-17 DIAGNOSIS — Z79899 Other long term (current) drug therapy: Secondary | ICD-10-CM | POA: Insufficient documentation

## 2023-07-17 DIAGNOSIS — I471 Supraventricular tachycardia, unspecified: Secondary | ICD-10-CM | POA: Diagnosis not present

## 2023-07-17 DIAGNOSIS — Z01812 Encounter for preprocedural laboratory examination: Secondary | ICD-10-CM | POA: Insufficient documentation

## 2023-07-17 DIAGNOSIS — Z01818 Encounter for other preprocedural examination: Secondary | ICD-10-CM | POA: Diagnosis present

## 2023-07-17 DIAGNOSIS — K824 Cholesterolosis of gallbladder: Secondary | ICD-10-CM | POA: Diagnosis not present

## 2023-07-17 HISTORY — DX: Cardiac arrhythmia, unspecified: I49.9

## 2023-07-17 LAB — CBC
HCT: 39.8 % (ref 36.0–46.0)
Hemoglobin: 13.2 g/dL (ref 12.0–15.0)
MCH: 29.6 pg (ref 26.0–34.0)
MCHC: 33.2 g/dL (ref 30.0–36.0)
MCV: 89.2 fL (ref 80.0–100.0)
Platelets: 232 10*3/uL (ref 150–400)
RBC: 4.46 MIL/uL (ref 3.87–5.11)
RDW: 12.2 % (ref 11.5–15.5)
WBC: 6.5 10*3/uL (ref 4.0–10.5)
nRBC: 0 % (ref 0.0–0.2)

## 2023-07-20 ENCOUNTER — Encounter (HOSPITAL_COMMUNITY): Payer: Self-pay

## 2023-07-20 NOTE — Progress Notes (Signed)
Case: [id number] Date/Time: 07/24/23 1445   Procedure: LAPAROSCOPIC CHOLECYSTECTOMY   Anesthesia type: General   Pre-op diagnosis: GALLBLADDER POLYP AND BILIARY COLIC   Location: [facility] ROOM 01 / [facility]   Surgeons: Stechschulte, Hyman Hopes, MD       DISCUSSION: Janet Thompson is a 33 yo female who presents to PAT prior to surgery above. PMH of migraines, SVT, hx of syncope.  Patient was seen by cardiology on 12/30/2022 for evaluation of palpitations and syncope.  She is prescribed a beta-blocker and was referred to EP to discuss ablation.  Has not followed up with EP yet. She does not use beta-blocker due to fatigue.  Advised to avoid triggers.  Patient also has history of syncope for years with unclear etiology.  No recent episodes.  Echo obtained in August 2024 showed normal EF with no significant valvular abnormality.  VS: BP (!) 131/92   Pulse 86   Temp 36.6 C (Oral)   Resp 16   Ht 5' 9.5" (1.765 m)   Wt 70.8 kg   LMP 06/25/2023 (Exact Date)   SpO2 100%   BMI 22.71 kg/m   PROVIDERS: Karie Georges, MD   LABS: Labs reviewed: Acceptable for surgery. (all labs ordered are listed, but only abnormal results are displayed)  Labs Reviewed  CBC     IMAGES: CXR 11/14/22:  FINDINGS: The heart size and mediastinal contours are within normal limits. Both lungs are clear. The visualized skeletal structures are unremarkable.   IMPRESSION: No active disease.  EKG:   CV:   Echo 02/06/2023: IMPRESSIONS    1. Left ventricular ejection fraction, by estimation, is 60 to 65%. The left ventricle has normal function. The left ventricle has no regional wall motion abnormalities. Left ventricular diastolic parameters were normal. The average left ventricular global longitudinal strain is 26.9 %. The global longitudinal strain is normal.  2. Right ventricular systolic function is normal. The right ventricular size is normal.  3. The mitral valve is myxomatous. Trivial mitral valve  regurgitation. No evidence of mitral stenosis. There is mild holosystolic prolapse of the middle segment of the anterior leaflet of the mitral valve.  4. The aortic valve is tricuspid. Aortic valve regurgitation is not visualized. No aortic stenosis is present.  5. The inferior vena cava is normal in size with greater than 50% respiratory variability, suggesting right atrial pressure of 3 mmHg.   Past Medical History:  Diagnosis Date   Cervical dysplasia, moderate 11/2021   Dysrhythmia    Frequent headaches    History of fainting spells of unknown cause    Menstrual irregularity    Migraines    Placenta previa antepartum in second trimester 05/07/2018   Positive QuantiFERON-TB Gold test     Past Surgical History:  Procedure Laterality Date   LEEP N/A 12/30/2021   Procedure: LOOP ELECTROSURGICAL EXCISION PROCEDURE (LEEP);  Surgeon: Hildred Laser, MD;  Location: [facility] ORS;  Service: Gynecology;  Laterality: N/A;   WISDOM TOOTH EXTRACTION      MEDICATIONS:  ibuprofen (ADVIL) 200 MG tablet   SUMAtriptan (IMITREX) 100 MG tablet   No current facility-administered medications for this encounter.   Marcille Blanco [facility] Short Stay/Anesthesiology [facility] Phone [phone] 07/20/2023 11:11 AM

## 2023-07-20 NOTE — Anesthesia Preprocedure Evaluation (Addendum)
Anesthesia Evaluation  Patient identified by MRN, date of birth, ID band Patient awake    Reviewed: Allergy & Precautions, H&P , NPO status , Patient's Chart, lab work & pertinent test results  Airway Mallampati: II  TM Distance: >3 FB Neck ROM: Full    Dental no notable dental hx. (+) Dental Advisory Given, Teeth Intact   Pulmonary neg pulmonary ROS   Pulmonary exam normal breath sounds clear to auscultation       Cardiovascular + dysrhythmias Supra Ventricular Tachycardia  Rhythm:Regular Rate:Normal     Neuro/Psych  Headaches  negative psych ROS   GI/Hepatic negative GI ROS, Neg liver ROS,,,  Endo/Other  negative endocrine ROS    Renal/GU negative Renal ROS  negative genitourinary   Musculoskeletal   Abdominal   Peds  Hematology negative hematology ROS (+)   Anesthesia Other Findings   Reproductive/Obstetrics negative OB ROS                             Anesthesia Physical Anesthesia Plan  ASA: 2  Anesthesia Plan: General   Post-op Pain Management: Tylenol PO (pre-op)*   Induction: Intravenous  PONV Risk Score and Plan: 4 or greater and Ondansetron, Dexamethasone and Midazolam  Airway Management Planned: Oral ETT  Additional Equipment:   Intra-op Plan:   Post-operative Plan: Extubation in OR  Informed Consent: I have reviewed the patients History and Physical, chart, labs and discussed the procedure including the risks, benefits and alternatives for the proposed anesthesia with the patient or authorized representative who has indicated his/her understanding and acceptance.     Dental advisory given  Plan Discussed with: CRNA  Anesthesia Plan Comments: (See PAT note from 1/17 by Sherlie Ban PA-C )        Anesthesia Quick Evaluation

## 2023-07-24 ENCOUNTER — Ambulatory Visit (HOSPITAL_COMMUNITY)
Admission: RE | Admit: 2023-07-24 | Discharge: 2023-07-24 | Disposition: A | Discharge status: Home / Self Care | Payer: 59 | Attending: Surgery | Admitting: Surgery

## 2023-07-24 ENCOUNTER — Encounter (HOSPITAL_COMMUNITY): Payer: Self-pay | Admitting: Surgery

## 2023-07-24 ENCOUNTER — Ambulatory Visit (HOSPITAL_BASED_OUTPATIENT_CLINIC_OR_DEPARTMENT_OTHER): Payer: 59 | Admitting: Anesthesiology

## 2023-07-24 ENCOUNTER — Other Ambulatory Visit: Payer: Self-pay

## 2023-07-24 ENCOUNTER — Encounter (HOSPITAL_COMMUNITY)
Admission: RE | Disposition: A | Discharge status: Home / Self Care | Payer: Self-pay | Source: Home / Self Care | Attending: Surgery

## 2023-07-24 ENCOUNTER — Ambulatory Visit (HOSPITAL_COMMUNITY): Payer: 59 | Admitting: Medical

## 2023-07-24 DIAGNOSIS — K819 Cholecystitis, unspecified: Secondary | ICD-10-CM

## 2023-07-24 DIAGNOSIS — K8044 Calculus of bile duct with chronic cholecystitis without obstruction: Secondary | ICD-10-CM | POA: Diagnosis not present

## 2023-07-24 DIAGNOSIS — K8050 Calculus of bile duct without cholangitis or cholecystitis without obstruction: Secondary | ICD-10-CM | POA: Diagnosis not present

## 2023-07-24 DIAGNOSIS — K824 Cholesterolosis of gallbladder: Secondary | ICD-10-CM

## 2023-07-24 DIAGNOSIS — K8064 Calculus of gallbladder and bile duct with chronic cholecystitis without obstruction: Secondary | ICD-10-CM | POA: Diagnosis not present

## 2023-07-24 HISTORY — PX: CHOLECYSTECTOMY: SHX55

## 2023-07-24 LAB — POCT PREGNANCY, URINE: Preg Test, Ur: NEGATIVE

## 2023-07-24 SURGERY — LAPAROSCOPIC CHOLECYSTECTOMY
Anesthesia: General | Site: Abdomen

## 2023-07-24 MED ORDER — 0.9 % SODIUM CHLORIDE (POUR BTL) OPTIME
TOPICAL | Status: DC | PRN
  Administered 2023-07-24: 1000 mL

## 2023-07-24 MED ORDER — LIDOCAINE 2% (20 MG/ML) 5 ML SYRINGE
INTRAMUSCULAR | Status: DC | PRN
  Administered 2023-07-24: 60 mg via INTRAVENOUS

## 2023-07-24 MED ORDER — PROPOFOL 10 MG/ML IV BOLUS
INTRAVENOUS | Status: AC
Start: 2023-07-24 — End: ?
  Filled 2023-07-24: qty 20

## 2023-07-24 MED ORDER — DEXAMETHASONE SODIUM PHOSPHATE 10 MG/ML IJ SOLN
INTRAMUSCULAR | Status: DC | PRN
  Administered 2023-07-24: 10 mg via INTRAVENOUS

## 2023-07-24 MED ORDER — FENTANYL CITRATE (PF) 100 MCG/2ML IJ SOLN
INTRAMUSCULAR | Status: AC
  Filled 2023-07-24: qty 2

## 2023-07-24 MED ORDER — OXYCODONE HCL 5 MG PO TABS
ORAL_TABLET | ORAL | Status: AC
  Filled 2023-07-24: qty 1

## 2023-07-24 MED ORDER — OXYCODONE-ACETAMINOPHEN 5-325 MG PO TABS: 1.0000 | ORAL_TABLET | ORAL | 0 refills | Status: DC | PRN

## 2023-07-24 MED ORDER — DEXAMETHASONE SODIUM PHOSPHATE 10 MG/ML IJ SOLN
INTRAMUSCULAR | Status: AC
Start: 2023-07-24 — End: ?
  Filled 2023-07-24: qty 1

## 2023-07-24 MED ORDER — BUPIVACAINE-EPINEPHRINE 0.25% -1:200000 IJ SOLN
INTRAMUSCULAR | Status: DC | PRN
  Administered 2023-07-24: 20 mL

## 2023-07-24 MED ORDER — CHLORHEXIDINE GLUCONATE CLOTH 2 % EX PADS: 6.0000 | MEDICATED_PAD | Freq: Once | CUTANEOUS | Status: DC

## 2023-07-24 MED ORDER — FENTANYL CITRATE (PF) 100 MCG/2ML IJ SOLN
INTRAMUSCULAR | Status: DC | PRN
  Administered 2023-07-24 (×2): 100 ug via INTRAVENOUS

## 2023-07-24 MED ORDER — SUGAMMADEX SODIUM 200 MG/2ML IV SOLN
INTRAVENOUS | Status: DC | PRN
  Administered 2023-07-24: 200 mg via INTRAVENOUS

## 2023-07-24 MED ORDER — CELECOXIB 200 MG PO CAPS
400.0000 mg | ORAL_CAPSULE | ORAL | Status: AC
Start: 2023-07-25 — End: 2023-07-24
  Administered 2023-07-24: 400 mg via ORAL
  Filled 2023-07-24: qty 2

## 2023-07-24 MED ORDER — BUPIVACAINE-EPINEPHRINE 0.25% -1:200000 IJ SOLN
INTRAMUSCULAR | Status: AC
  Filled 2023-07-24: qty 1

## 2023-07-24 MED ORDER — ONDANSETRON HCL 4 MG/2ML IJ SOLN
INTRAMUSCULAR | Status: AC
  Filled 2023-07-24: qty 2

## 2023-07-24 MED ORDER — ORAL CARE MOUTH RINSE: 15.0000 mL | Freq: Once | OROMUCOSAL | Status: AC

## 2023-07-24 MED ORDER — MIDAZOLAM HCL 5 MG/5ML IJ SOLN
INTRAMUSCULAR | Status: DC | PRN
  Administered 2023-07-24: 2 mg via INTRAVENOUS

## 2023-07-24 MED ORDER — LIDOCAINE HCL (PF) 2 % IJ SOLN
INTRAMUSCULAR | Status: AC
Start: 2023-07-24 — End: ?
  Filled 2023-07-24: qty 5

## 2023-07-24 MED ORDER — LACTATED RINGERS IV SOLN
INTRAVENOUS | Status: DC
Start: 2023-07-24 — End: 2023-07-24

## 2023-07-24 MED ORDER — OXYCODONE HCL 5 MG PO TABS
5.0000 mg | ORAL_TABLET | Freq: Once | ORAL | Status: AC
  Administered 2023-07-24: 5 mg via ORAL

## 2023-07-24 MED ORDER — BUPIVACAINE LIPOSOME 1.3 % IJ SUSP: 20.0000 mL | Freq: Once | INTRAMUSCULAR | Status: DC

## 2023-07-24 MED ORDER — GABAPENTIN 300 MG PO CAPS
300.0000 mg | ORAL_CAPSULE | ORAL | Status: AC
  Administered 2023-07-24: 300 mg via ORAL
  Filled 2023-07-24: qty 1

## 2023-07-24 MED ORDER — PROPOFOL 10 MG/ML IV BOLUS
INTRAVENOUS | Status: DC | PRN
  Administered 2023-07-24: 150 mg via INTRAVENOUS

## 2023-07-24 MED ORDER — MIDAZOLAM HCL 2 MG/2ML IJ SOLN
INTRAMUSCULAR | Status: AC
  Filled 2023-07-24: qty 2

## 2023-07-24 MED ORDER — CHLORHEXIDINE GLUCONATE 0.12 % MT SOLN
15.0000 mL | Freq: Once | OROMUCOSAL | Status: AC
  Administered 2023-07-24: 15 mL via OROMUCOSAL

## 2023-07-24 MED ORDER — CEFAZOLIN SODIUM-DEXTROSE 2-4 GM/100ML-% IV SOLN
2.0000 g | INTRAVENOUS | Status: AC
  Administered 2023-07-24: 2 g via INTRAVENOUS
  Filled 2023-07-24: qty 100

## 2023-07-24 MED ORDER — ROCURONIUM BROMIDE 10 MG/ML (PF) SYRINGE
PREFILLED_SYRINGE | INTRAVENOUS | Status: DC | PRN
  Administered 2023-07-24: 60 mg via INTRAVENOUS

## 2023-07-24 MED ORDER — ACETAMINOPHEN 500 MG PO TABS
1000.0000 mg | ORAL_TABLET | ORAL | Status: AC
  Administered 2023-07-24: 1000 mg via ORAL
  Filled 2023-07-24: qty 2

## 2023-07-24 MED ORDER — HYDROMORPHONE HCL 1 MG/ML IJ SOLN: 0.2500 mg | INTRAMUSCULAR | Status: DC | PRN

## 2023-07-24 MED ORDER — ONDANSETRON HCL 4 MG/2ML IJ SOLN
INTRAMUSCULAR | Status: DC | PRN
  Administered 2023-07-24: 4 mg via INTRAVENOUS

## 2023-07-24 SURGICAL SUPPLY — 35 items
APPLIER CLIP ROT 10 11.4 M/L (STAPLE) ×1
BAG COUNTER SPONGE SURGICOUNT (BAG) IMPLANT
CABLE HIGH FREQUENCY MONO STRZ (ELECTRODE) ×1 IMPLANT
CATH URETL OPEN 5X70 (CATHETERS) IMPLANT
CHLORAPREP W/TINT 26 (MISCELLANEOUS) ×1 IMPLANT
CLIP APPLIE ROT 10 11.4 M/L (STAPLE) ×1 IMPLANT
COVER MAYO STAND XLG (MISCELLANEOUS) ×1 IMPLANT
COVER SURGICAL LIGHT HANDLE (MISCELLANEOUS) ×1 IMPLANT
DERMABOND ADVANCED .7 DNX12 (GAUZE/BANDAGES/DRESSINGS) ×1 IMPLANT
DRAPE C-ARM 42X120 X-RAY (DRAPES) IMPLANT
ELECT REM PT RETURN 15FT ADLT (MISCELLANEOUS) ×1 IMPLANT
ENDOLOOP SUT PDS II 0 18 (SUTURE) ×1 IMPLANT
GLOVE BIO SURGEON STRL SZ7.5 (GLOVE) ×1 IMPLANT
GLOVE INDICATOR 8.0 STRL GRN (GLOVE) ×1 IMPLANT
GOWN STRL REUS W/ TWL XL LVL3 (GOWN DISPOSABLE) ×2 IMPLANT
GRASPER SUT TROCAR 14GX15 (MISCELLANEOUS) IMPLANT
HEMOSTAT SNOW SURGICEL 2X4 (HEMOSTASIS) IMPLANT
IRRIG SUCT STRYKERFLOW 2 WTIP (MISCELLANEOUS) ×1
IRRIGATION SUCT STRKRFLW 2 WTP (MISCELLANEOUS) ×1 IMPLANT
IV CATH 14GX2 1/4 (CATHETERS) ×1 IMPLANT
KIT BASIN OR (CUSTOM PROCEDURE TRAY) ×1 IMPLANT
KIT TURNOVER KIT A (KITS) IMPLANT
NDL INSUFFLATION 14GA 120MM (NEEDLE) ×1 IMPLANT
NEEDLE INSUFFLATION 14GA 120MM (NEEDLE) ×1
POUCH RETRIEVAL ECOSAC 10 (ENDOMECHANICALS) ×1 IMPLANT
SCISSORS LAP 5X35 DISP (ENDOMECHANICALS) ×1 IMPLANT
SET TUBE SMOKE EVAC HIGH FLOW (TUBING) ×1 IMPLANT
SLEEVE Z-THREAD 5X100MM (TROCAR) ×2 IMPLANT
SPIKE FLUID TRANSFER (MISCELLANEOUS) ×1 IMPLANT
STOPCOCK 4 WAY LG BORE MALE ST (IV SETS) IMPLANT
SUT MNCRL AB 4-0 PS2 18 (SUTURE) ×1 IMPLANT
TOWEL OR 17X26 10 PK STRL BLUE (TOWEL DISPOSABLE) ×1 IMPLANT
TRAY LAPAROSCOPIC (CUSTOM PROCEDURE TRAY) ×1 IMPLANT
TROCAR ADV FIXATION 12X100MM (TROCAR) ×1 IMPLANT
TROCAR Z-THREAD OPTICAL 5X100M (TROCAR) ×1 IMPLANT

## 2023-07-24 NOTE — H&P (Signed)
Admitting Physician: Hyman Hopes Pranathi Winfree  Service: General Surgery  CC: Gallbladder polyp  Subjective   HPI: Janet Thompson is an 33 y.o. female who is here for cholecystectomy  Past Medical History:  Diagnosis Date   Cervical dysplasia, moderate 11/2021   Dysrhythmia    Frequent headaches    History of fainting spells of unknown cause    Menstrual irregularity    Migraines    Placenta previa antepartum in second trimester 05/07/2018   Positive QuantiFERON-TB Gold test     Past Surgical History:  Procedure Laterality Date   LEEP N/A 12/30/2021   Procedure: LOOP ELECTROSURGICAL EXCISION PROCEDURE (LEEP);  Surgeon: Hildred Laser, MD;  Location: [facility] ORS;  Service: Gynecology;  Laterality: N/A;   WISDOM TOOTH EXTRACTION      Family History  Problem Relation Age of Onset   Healthy Mother        heavy menses; early hysterectomy   High blood pressure Father    Alcohol abuse Maternal Grandfather        colon   Diabetes Paternal Grandmother    Colon cancer Maternal Aunt 25    Social:  reports that she has never smoked. She has never used smokeless tobacco. She reports that she does not currently use alcohol. She reports that she does not use drugs.  Allergies: No Known Allergies  Medications: Current Outpatient Medications  Medication Instructions   ibuprofen (ADVIL) 400-600 mg, Every 6 hours PRN   SUMAtriptan (IMITREX) 100 mg, Oral, Daily PRN    ROS - all of the below systems have been reviewed with the patient and positives are indicated with bold text General: chills, fever or night sweats Eyes: blurry vision or double vision ENT: epistaxis or sore throat Allergy/Immunology: itchy/watery eyes or nasal congestion Hematologic/Lymphatic: bleeding problems, blood clots or swollen lymph nodes Endocrine: temperature intolerance or unexpected weight changes Breast: new or changing breast lumps or nipple discharge Resp: cough, shortness of breath, or wheezing CV:  chest pain or dyspnea on exertion GI: as per HPI GU: dysuria, trouble voiding, or hematuria MSK: joint pain or joint stiffness Neuro: TIA or stroke symptoms Derm: pruritus and skin lesion changes Psych: anxiety and depression  Objective   PE Blood pressure (!) 160/91, pulse 94, temperature 97.9 F (36.6 C), temperature source Oral, resp. rate 16, height 5' 9.5" (1.765 m), weight 70.8 kg, last menstrual period 06/25/2023, SpO2 100%. Constitutional: NAD; conversant; no deformities Eyes: Moist conjunctiva; no lid lag; anicteric; PERRL Neck: Trachea midline; no thyromegaly Lungs: Normal respiratory effort; no tactile fremitus CV: RRR; no palpable thrills; no pitting edema GI: Abd soft, nontender; no palpable hepatosplenomegaly MSK: Normal range of motion of extremities; no clubbing/cyanosis Psychiatric: Appropriate affect; alert and oriented x3 Lymphatic: No palpable cervical or axillary lymphadenopathy  No results found for this or any previous visit (from the past 24 hours).  Imaging Orders  No imaging studies ordered today  RUQ [place] 05/11/2023  1. 6 mm gallbladder polyp. Recommend follow-up ultrasound in 1 year. 2. No cholelithiasis or sonographic evidence for acute cholecystitis.  RUQ [place] 11/14/22  1. No acute abnormality of the liver or gallbladder. 2. Multiple non mobile echogenic structures measuring up to 6 mm are likely polyps or tumefactive sludge. A follow-up ultrasound is recommended in 6 months to document stability.     Assessment and Plan   Janet Thompson has gallbladder polyps measuring 6 mm and symptoms consistent with biliary colic. We discussed observation versus surgery. We discussed laparoscopic cholecystectomy. I  explained the procedure itself as well as its risk, benefits, and alternatives. I explained observation would mean another repeat ultrasound per the radiologist recommendations.  She decided to proceed with cholecystectomy.  We will proceed today as  scheduled.     ICD-10-CM   1. Cholecystitis  K81.9 POCT Pregnancy, Urine       Quentin Ore, MD  [facility] Use [email or web address] to contact on call provider

## 2023-07-24 NOTE — Anesthesia Procedure Notes (Signed)
Procedure Name: Intubation Date/Time: 07/24/2023 1:43 PM  Performed by: Doran Clay, CRNAPre-anesthesia Checklist: Patient identified, Emergency Drugs available, Patient being monitored, Suction available and Timeout performed Patient Re-evaluated:Patient Re-evaluated prior to induction Oxygen Delivery Method: Circle system utilized Preoxygenation: Pre-oxygenation with 100% oxygen Induction Type: IV induction Ventilation: Mask ventilation without difficulty Laryngoscope Size: Mac and 3 Grade View: Grade I Tube type: Oral Tube size: 7.0 mm Number of attempts: 1 Airway Equipment and Method: Stylet Placement Confirmation: ETT inserted through vocal cords under direct vision, positive ETCO2 and breath sounds checked- equal and bilateral Secured at: 23 cm Tube secured with: Tape Dental Injury: Teeth and Oropharynx as per pre-operative assessment

## 2023-07-24 NOTE — Anesthesia Postprocedure Evaluation (Signed)
Anesthesia Post Note  Patient: Janet Thompson  Procedure(s) Performed: LAPAROSCOPIC CHOLECYSTECTOMY (Abdomen)     Patient location during evaluation: PACU Anesthesia Type: General Level of consciousness: awake and alert Pain management: pain level controlled Vital Signs Assessment: post-procedure vital signs reviewed and stable Respiratory status: spontaneous breathing, nonlabored ventilation and respiratory function stable Cardiovascular status: blood pressure returned to baseline and stable Postop Assessment: no apparent nausea or vomiting Anesthetic complications: no  No notable events documented.  Last Vitals:  Vitals:   07/24/23 1500 07/24/23 1515  BP: 119/86 116/79  Pulse: 76 66  Resp: 14 17  Temp:  (!) 36.3 C  SpO2: 100% 100%    Last Pain:  Vitals:   07/24/23 1515  TempSrc:   PainSc: 0-No pain                 Tray Klayman,W. EDMOND

## 2023-07-24 NOTE — Op Note (Signed)
Patient: Janet Thompson ([birth year], [id number])  Date of Surgery: 07/24/2023  Preoperative Diagnosis: GALLBLADDER POLYP AND BILIARY COLIC   Postoperative Diagnosis: GALLBLADDER POLYP AND BILIARY COLIC   Surgical Procedure: LAPAROSCOPIC CHOLECYSTECTOMY:    Operative Team Members:  Surgeons and Role:    * Selena Swaminathan, Hyman Hopes, MD - Primary   Anesthesiologist: Gaynelle Adu, MD CRNA: Doran Clay, CRNA   Anesthesia: General   Fluids:  No intake/output data recorded.  Complications: None  Drains:  none   Specimen:  ID Type Source Tests Collected by Time Destination  1 : Gallbladder Tissue PATH Gallbladder SURGICAL PATHOLOGY Avangelina Flight, Hyman Hopes, MD 07/24/2023 1422      Disposition:  PACU - hemodynamically stable.  Plan of Care: Discharge to home after PACU    Indications for Procedure: Janet Thompson is a 33 y.o. female who presented with biliary colic and gallbladder polyp.  Laparoscopic cholecystectomy was recommended for the patient.  The procedure itself, as well as the risks, benefits and alternatives were discussed with the patient.  Risks discussed included but were not limited to the risk of infection, bleeding, damage to nearby structures, need to convert to open procedure, incisional hernia, bile leak, common bile duct injury and the need for additional procedures or surgeries.  With this discussion complete and all questions answered the patient granted consent to proceed.  Findings: Gallbladder not acutely inflamed  Infection status: Patient: Private Patient Elective Case Case: Elective Infection Present At Time Of Surgery (PATOS): None   Description of Procedure:   On the date stated above, the patient was taken to the operating room suite and placed in supine positioning.  Sequential compression devices were placed on the lower extremities to prevent blood clots.  General endotracheal anesthesia was induced. Preoperative antibiotics were  given.  The patient's abdomen was prepped and draped in the usual sterile fashion.  A time-out was completed verifying the correct patient, procedure, positioning and equipment needed for the case.  We began by anesthetizing the skin with local anesthetic and then making a 5 mm incision just below the umbilicus.  We dissected through the subcutaneous tissues to the fascia.  The fascia was grasped and elevated using a Kocher clamp.  A Veress needle was inserted into the abdomen and the abdomen was insufflated to 15 mmHg.  A 5 mm trocar was inserted in this position under optical guidance and then the abdomen was inspected.  There was no trauma to the underlying viscera with initial trocar placement.  Any abnormal findings, other than inflammation in the right upper quadrant, are listed above in the findings section.  Three additional trocars were placed, one 12 mm trocar in the subxiphoid position, one 5 mm trocar in the midline epigastric area and one 5mm trocar in the right upper quadrant subcostally.  These were placed under direct vision without any trauma to the underlying viscera.    The patient was then placed in head up, left side down positioning.  The gallbladder was identified and dissected free from its attachments to the omentum allowing the duodenum to fall away.  The infundibulum of the gallbladder was dissected free working laterally to medially.  The cystic duct and cystic artery were dissected free from surrounding connective tissue.  The infundibulum of the gallbladder was dissected off the cystic plate.  A critical view of safety was obtained with the cystic duct and cystic artery being cleared of connective tissues and clearly the only two structures entering into the gallbladder  with the liver clearly visible behind.  Clips were then applied to the cystic duct and cystic artery and then these structures were divided.  PDS endoloop was placed on the cystic duct stump. The gallbladder was  dissected off the cystic plate, placed in an endocatch bag and removed from the 12 mm subxiphoid port site.  The clips were inspected and appeared effective.  The cystic plate was inspected and hemostasis was obtained using electrocautery.  The operative field was dry on final inspection.  Attention was turned to closure.  The 12 mm subxiphoid port site was closed using a 0-vicryl suture on a fascial suture passer.  The abdomen was desufflated.  The skin was closed using 4-0 monocryl and dermabond.  All sponge and needle counts were correct at the conclusion of the case.    Ivar Drape, MD General, Bariatric, & Minimally Invasive Surgery [facility], [place]

## 2023-07-24 NOTE — Transfer of Care (Signed)
Immediate Anesthesia Transfer of Care Note  Patient: Janet Thompson  Procedure(s) Performed: LAPAROSCOPIC CHOLECYSTECTOMY (Abdomen)  Patient Location: PACU  Anesthesia Type:General  Level of Consciousness: sedated  Airway & Oxygen Therapy: Patient Spontanous Breathing and Patient connected to face mask  Post-op Assessment: Report given to RN and Post -op Vital signs reviewed and stable  Post vital signs: Reviewed and stable  Last Vitals:  Vitals Value Taken Time  BP 121/68 07/24/23 1437  Temp    Pulse 56 07/24/23 1439  Resp 15 07/24/23 1439  SpO2 100 % 07/24/23 1439  Vitals shown include unfiled device data.  Last Pain:  Vitals:   07/24/23 1308  TempSrc: Oral  PainSc:          Complications: No notable events documented.

## 2023-07-24 NOTE — Discharge Instructions (Signed)
CHOLECYSTECTOMY POST OPERATIVE INSTRUCTIONS  Thinking Clearly  The anesthesia may cause you to feel different for 1 or 2 days. Do not drive, drink alcohol, or make any big decisions for at least 2 days.  Nutrition When you wake up, you will be able to drink small amounts of liquid. If you do not feel sick, you can slowly advance your diet to regular foods. Continue to drink lots of fluids, usually about 8 to 10 glasses per day. Eat a high-fiber diet so you don't strain during bowel movements. High-Fiber Foods Foods high in fiber include beans, bran cereals and whole-grain breads, peas, dried fruit (figs, apricots, and dates), raspberries, blackberries, strawberries, sweet corn, broccoli, baked potatoes with skin, plums, pears, apples, greens, and nuts. Activity Slowly increase your activity. Be sure to get up and walk every hour or so to prevent blood clots. No heavy lifting or strenuous activity for 4 weeks following surgery to prevent hernias at your incision sites It is normal to feel tired. You may need more sleep than usual.  Get your rest but make sure to get up and move around frequently to prevent blood clots and pneumonia.  Work and Return to [employer] can go back to work when you feel well enough. Discuss the timing with your surgeon. You can usually go back to school or work 1 week after an operation. If your work requires heavy lifting or strenuous activity you need to be placed on light duty for 4 weeks following surgery. You can return to gym class, sports or other physical activities 4 weeks after surgery.  Wound Care Always wash your hands before and after touching near your incision site. Do not soak in a bathtub until cleared at your follow up appointment. You may take a shower 24 hours after surgery. A small amount of drainage from the incision is normal. If the drainage is thick and yellow or the site is red, you may have an infection, so call your surgeon. If you  have a drain in one of your incisions, it will be taken out in office when the drainage stops. Steri-Strips will fall off in 7 to 10 days or they will be removed during your first office visit. If you have dermabond glue covering over the incision, allow the glue to flake off on its own. Avoid wearing tight or rough clothing. It may rub your incisions and make it harder for them to heal. Protect the new skin, especially from the sun. The sun can burn and cause darker scarring. Your scar will heal in about 4 to 6 weeks and will become softer and continue to fade over the next year.  The cosmetic appearance of the incisions will improve over the course of the first year after surgery. Sensation around your incision will return in a few weeks or months.  Bowel Movements After intestinal surgery, you may have loose watery stools for several days. If watery diarrhea lasts longer than 3 days, contact your surgeon. Pain medication (narcotics) can cause constipation. Increase the fiber in your diet with high-fiber foods if you are constipated. You can take an over the counter stool softener like Colace to avoid constipation.  Additional over the counter medications can also be used if Colace isn't sufficient (for example, Milk of Magnesia or Miralax).  Pain The amount of pain is different for each person. Some people need only 1 to 3 doses of pain control medication, while others need more. Take alternating doses of tylenol  and ibuprofen around the clock for the first five days following surgery.  This will provide a baseline of pain control and help with inflammation.  Take the narcotic pain medication in addition if needed for severe pain.  Contact Your Surgeon at [phone], if you have: Pain in your right upper abdomen like a gallbladder attack. Pain that will not go away Pain that gets worse A fever of more than 101F (38.3C) Repeated vomiting Swelling, redness, bleeding, or bad-smelling  drainage from your wound site Strong abdominal pain No bowel movement or unable to pass gas for 3 days Watery diarrhea lasting longer than 3 days  Pain Control The goal of pain control is to minimize pain, keep you moving and help you heal. Your surgical team will work with you on your pain plan. Most often a combination of therapies and medications are used to control your pain. You may also be given medication (local anesthetic) at the surgical site. This may help control your pain for several days. Extreme pain puts extra stress on your body at a time when your body needs to focus on healing. Do not wait until your pain has reached a level "10" or is unbearable before telling your doctor or nurse. It is much easier to control pain before it becomes severe. Following a laparoscopic procedure, pain is sometimes felt in the shoulder. This is due to the gas inserted into your abdomen during the procedure. Moving and walking helps to decrease the gas and the right shoulder pain.  Use the guide below for ways to manage your post-operative pain. Learn more by going to facs.org/safepaincontrol.  How Intense Is My Pain Common Therapies to Feel Better       I hardly notice my pain, and it does not interfere with my activities.  I notice my pain and it distracts me, but I can still do activities (sitting up, walking, standing).  Non-Medication Therapies  Ice (in a bag, applied over clothing at the surgical site), elevation, rest, meditation, massage, distraction (music, TV, play) walking and mild exercise Splinting the abdomen with pillows +  Non-Opioid Medications Acetaminophen (Tylenol) Non-steroidal anti-inflammatory drugs (NSAIDS) Aspirin, Ibuprofen (Motrin, Advil) Naproxen (Aleve) Take these as needed, when you feel pain. Both acetaminophen and NSAIDs help to decrease pain and swelling (inflammation).      My pain is hard to ignore and is more noticeable even when I rest.  My  pain interferes with my usual activities.  Non-Medication Therapies  +  Non-Opioid medications  Take on a regular schedule (around-the-clock) instead of as needed. (For example, Tylenol every 6 hours at 9:00 am, 3:00 pm, 9:00 pm, 3:00 am and Motrin every 6 hours at 12:00 am, 6:00 am, 12:00 pm, 6:00 pm)         I am focused on my pain, and I am not doing my daily activities.  I am groaning in pain, and I cannot sleep. I am unable to do anything.  My pain is as bad as it could be, and nothing else matters.  Non-Medication Therapies  +  Around-the-Clock Non-Opioid Medications  +  Short-acting opioids  Opioids should be used with other medications to manage severe pain. Opioids block pain and give a feeling of euphoria (feel high). Addiction, a serious side effect of opioids, is rare with short-term (a few days) use.  Examples of short-acting opioids include: Tramadol (Ultram), Hydrocodone (Norco, Vicodin), Hydromorphone (Dilaudid), Oxycodone (Oxycontin)     The above directions have been adapted from  the [employer] of Sur[employer] Surgical Patient Education Program.  Please refer to the ACS website if needed: FreakyMates.de.ashx.   Ivar Drape, MD [facility] [address], [address], [place], [place]  [address] ?  [address] [address], [place], [place]   [address] [phone] ? [phone] ? FAX [phone] Web site: [email or web address]

## 2023-07-25 ENCOUNTER — Other Ambulatory Visit: Payer: Self-pay

## 2023-07-25 ENCOUNTER — Encounter (HOSPITAL_COMMUNITY): Payer: Self-pay | Admitting: Surgery

## 2023-07-27 LAB — SURGICAL PATHOLOGY

## 2023-08-16 ENCOUNTER — Encounter: Payer: Self-pay | Admitting: Obstetrics and Gynecology

## 2023-08-19 ENCOUNTER — Encounter: Payer: Self-pay | Admitting: Obstetrics and Gynecology

## 2023-08-19 ENCOUNTER — Ambulatory Visit: Payer: 59 | Admitting: Obstetrics and Gynecology

## 2023-08-19 VITALS — BP 126/91 | HR 96 | Ht 69.5 in | Wt 151.9 lb

## 2023-08-19 DIAGNOSIS — N750 Cyst of Bartholin's gland: Secondary | ICD-10-CM | POA: Diagnosis not present

## 2023-08-19 MED ORDER — SULFAMETHOXAZOLE-TRIMETHOPRIM 800-160 MG PO TABS: 1.0000 | ORAL_TABLET | Freq: Two times a day (BID) | ORAL | 0 refills | Status: DC

## 2023-08-19 NOTE — Progress Notes (Signed)
    GYNECOLOGY PROGRESS NOTE  Subjective:    Patient ID: Janet Thompson, female    DOB: 10/16/1990, 33 y.o.   MRN: [id number]  HPI  Patient is a 33 y.o. [id number] female who presents for evaluation of possible right vaginal cyst and pain.  Notes that she initially began to feel a bulge several weeks ago in her vagina however was initially not uncomfortable. During intercourse last week she began to notice more and more discomfort to the point now where it is painful to sit, to touch.  Denies fevers or chills, or vaginal discharge.  She has a cyst on her labia that has been very painful. She is unable to sit because of the cyst.   The following portions of the patient's history were reviewed and updated as appropriate: allergies, current medications, past family history, past medical history, past social history, past surgical history, and problem list.  Review of Systems Pertinent items are noted in HPI.   Objective:   Blood pressure (!) 126/91, pulse 96, height 5' 9.5" (1.765 m), weight 151 lb 14.4 oz (68.9 kg).  Body mass index is 22.11 kg/m. General appearance: alert and no distress Pelvic: right labia with large Bartholin's cyst, tender to palpation, no erythema.  Remainder of vulva was normal.  Extremities: extremities normal, atraumatic, no cyanosis or edema   Assessment:   Bartholin's cyst    Plan:   - Incision and drainage of Bartholin's gland cyst performed today. Copious amount of pus drained from the cyst.  Will initiate on antibiotics post-procedure) Bactrim DS for 7 days.    Diagnosis: abscess (Bartholin's cyst)- Location: Right labia minora Procedure: Incision & drainage Informed consent:  Discussed risks (infection, pain, bleeding, bruising, and recurrence of the condition) and benefits of the procedure, as well as the alternatives.  Informed consent was obtained. Anesthesia: Local, 2 ml of 1% lidocaine The area was prepared and draped in a standard fashion.  Two ml  of 1% lidocaine was injected into the cyst. A stab incision was made. The lesion drained pus. A large amount of fluid was drained. The patient tolerated the procedure well. The patient was instructed on post-op care.   Hildred Laser, MD [facility]

## 2023-08-19 NOTE — Patient Instructions (Signed)
 Bartholin's Cyst Incision and Drainage, Care After  After Bartholin's cyst incision and drainage, it is common to have light vaginal discharge. It may contain blood. It is also common to have:  Mild pain.  Discomfort.  Follow these instructions at home:  The instructions below may help you care for yourself at home. Your health care provider may give you more instructions. If you have questions, ask your health care provider.  Medicines  Take over-the-counter and prescription medicines only as told by your health care provider.  If you were prescribed antibiotics, take them as told by your health care provider. Do not stop taking them even if you start to feel better.  Bathing    Take sitz baths as told by your health care provider. You may be told to start these 1-2 days after your procedure. Take them once or twice each day.  Incision care  Follow instructions from your health care provider about:  Signs of infection.  How to take care of your incision. Make sure you:  Wash your hands with soap and water for at least 20 seconds before and after you change your bandage. If you cannot use soap and water, use hand sanitizer.  Change your bandage.  Leave stitches or skin glue in place for at least 2 weeks.  Leave tape strips alone unless you are told to take them off. You may trim the edges of the tape strips if they curl up.  General instructions  Return to your normal activities when your health care provider says that it is safe.  Do not have vaginal sex or insert anything in your vagina until your health care provider says it is safe.  Wear an absorbent pad if you have any discharge.  Keep all follow-up visits. This is important. If you have a catheter, return to your health care provider to have it removed.  Contact a health care provider if:  You have chills or a fever.  You have pain even after taking medicine.  Your incision has signs of infection.  Your catheter comes out.  Summary  After your procedure, it  is common to have mild pain and light discharge.  Start taking sitz baths as told by your health care provider.  Do not have vaginal sex or insert anything in your vagina until your health care provider says it is safe.  Contact your health care provider if your incision has signs of infection.  If you have a catheter, return to your health care provider to have it removed.  This information is not intended to replace advice given to you by your health care provider. Make sure you discuss any questions you have with your health care provider.  Document Revised: 10/17/2021 Document Reviewed: 10/18/2021  Elsevier Patient Education  2024 [employer].

## 2023-08-31 ENCOUNTER — Ambulatory Visit: Payer: 59 | Admitting: Certified Nurse Midwife

## 2023-12-29 ENCOUNTER — Encounter: Payer: Self-pay | Admitting: Family Medicine

## 2023-12-29 ENCOUNTER — Ambulatory Visit: Admitting: Family Medicine

## 2023-12-29 VITALS — BP 110/80 | HR 88 | Temp 98.3°F | Ht 69.5 in | Wt 151.6 lb

## 2023-12-29 DIAGNOSIS — H9192 Unspecified hearing loss, left ear: Secondary | ICD-10-CM | POA: Diagnosis not present

## 2023-12-29 DIAGNOSIS — G43809 Other migraine, not intractable, without status migrainosus: Secondary | ICD-10-CM

## 2023-12-29 DIAGNOSIS — R42 Dizziness and giddiness: Secondary | ICD-10-CM | POA: Diagnosis not present

## 2023-12-29 NOTE — Progress Notes (Signed)
 Established Patient Office Visit  Subjective   Patient ID: Janet Thompson, female    DOB: 05-Jun-1991  Age: 33 y.o. MRN: [id number]  Chief Complaint  Patient presents with   Ear Pain    Patient complains of left ear pain x1.5 months, states she noticed a sudden sound of cotton in the ear, weird feeling, pressure and discomfort noted   Dizziness    Intermittent episodes of dizziness x2 months   Headache    Worseing x2 months, history of migraines and states Imitrex  has not helped recently    Pt reports new symptoms today. She reports about 1.5 months ago she had a weird feeling in the left ear, states that it was a weird sound and since then it has been hurting intermittently. States that she is now very sensitive to loud sounds cause more pain in the ear. Describes it as a pressure sensation.   Headaches appear to have worsened over the last few months, has been taking the sumatriptan  occasionally for migraines but has noticed she is getting more frequent headaches. States that she does get some nausea with migraines when she has them. Sumatriptan  is not as effective at controlling the migraine, states that they are more intense and take longer to resolve.   Pt is also reporting dizziness/vertigo symptoms that are random and intermittent. States that she would just be sitting or possible standing up or bending over  and she would get a feeling of dizziness. It would last around 30-60 seconds then resolve. Not sure if it is related to the migraines or the ear issue.      Current Outpatient Medications  Medication Instructions   ibuprofen  (ADVIL ) 400-600 mg, Every 6 hours PRN   sulfamethoxazole -trimethoprim  (BACTRIM  DS) 800-160 MG tablet 1 tablet, Oral, 2 times daily   SUMAtriptan  (IMITREX ) 100 mg, Oral, Daily PRN      Review of Systems  Constitutional:  Negative for malaise/fatigue and weight loss.  HENT:  Positive for ear pain and hearing loss.   Eyes:  Negative for  blurred vision and double vision.  Gastrointestinal:  Positive for nausea.  Neurological:  Positive for dizziness and headaches. Negative for seizures, loss of consciousness and weakness.  All other systems reviewed and are negative.     Objective:     BP 110/80   Pulse 88   Temp 98.3 F (36.8 C) (Oral)   Ht 5' 9.5 (1.765 m)   Wt 151 lb 9.6 oz (68.8 kg)   LMP 12/07/2023 (Exact Date)   SpO2 98%   BMI 22.07 kg/m    Physical Exam Vitals reviewed.  Constitutional:      Appearance: She is well-developed and normal weight.  HENT:     Head: Normocephalic and atraumatic.     Right Ear: Tympanic membrane and ear canal normal.     Left Ear: Tympanic membrane and ear canal normal.   Neurological:     Mental Status: She is alert and oriented to person, place, and time. Mental status is at baseline.      No results found for any visits on 12/29/23.    The ASCVD Risk score (Arnett DK, et al., 2019) failed to calculate for the following reasons:   The 2019 ASCVD risk score is only valid for ages 49 to 20    Assessment & Plan:  Unilateral hearing loss, left -     MR BRAIN WO CONTRAST; Future  Vertigo -     MR BRAIN WO CONTRAST;  Future  Other migraine without status migrainosus, not intractable -     MR BRAIN WO CONTRAST; Future   Ears on exam are WNL. I am concerned that the unilateral hearing issues and the vertigo may represent neurologic symptoms with her migraines, or it could be new symptoms altogether, will order MRI of the brain to rule out tumors/ masses or other pathology. I gave pt samples of nurtec 75 mg daily PRN to see if this improves her migraine pain better than the sumatriptan . We will decide if she needs referral to a specialist after the MRI.   Return in about 4 months (around 04/30/2024) for annual physical exam.    Heron CHRISTELLA Sharper, MD

## 2023-12-30 ENCOUNTER — Ambulatory Visit
Admission: RE | Admit: 2023-12-30 | Discharge: 2023-12-30 | Disposition: A | Discharge status: Home / Self Care | Source: Ambulatory Visit | Attending: Family Medicine | Admitting: Family Medicine

## 2023-12-30 DIAGNOSIS — R42 Dizziness and giddiness: Secondary | ICD-10-CM | POA: Diagnosis not present

## 2023-12-30 DIAGNOSIS — H9192 Unspecified hearing loss, left ear: Secondary | ICD-10-CM | POA: Diagnosis not present

## 2023-12-30 DIAGNOSIS — G43809 Other migraine, not intractable, without status migrainosus: Secondary | ICD-10-CM

## 2024-01-06 ENCOUNTER — Ambulatory Visit: Payer: Self-pay | Admitting: Family Medicine

## 2024-01-06 DIAGNOSIS — J321 Chronic frontal sinusitis: Secondary | ICD-10-CM

## 2024-01-06 MED ORDER — AMOXICILLIN-POT CLAVULANATE 875-125 MG PO TABS: 1.0000 | ORAL_TABLET | Freq: Two times a day (BID) | ORAL | 0 refills | Status: DC

## 2024-03-03 ENCOUNTER — Encounter: Payer: Self-pay | Admitting: Family Medicine

## 2024-03-03 DIAGNOSIS — G43809 Other migraine, not intractable, without status migrainosus: Secondary | ICD-10-CM

## 2024-03-04 MED ORDER — NURTEC 75 MG PO TBDP
1.0000 | ORAL_TABLET | ORAL | 2 refills | Status: AC
Start: 2024-03-04 — End: ?

## 2024-03-07 ENCOUNTER — Telehealth: Payer: Self-pay

## 2024-03-07 ENCOUNTER — Other Ambulatory Visit (HOSPITAL_COMMUNITY): Payer: Self-pay

## 2024-03-07 NOTE — Telephone Encounter (Signed)
 See prior Mychart message.

## 2024-03-07 NOTE — Telephone Encounter (Signed)
 Pharmacy Patient Advocate Encounter  Received notification from [employer] that Prior Authorization for Nurtec 75 has been APPROVED from 03/07/24 to 09/03/24. Ran test claim, Copay is $0.00. This test claim was processed through [facility]- copay amounts may vary at other pharmacies due to pharmacy/plan contracts, or as the patient moves through the different stages of their insurance plan.     PA #/Case ID/Reference #: [id number]

## 2024-03-07 NOTE — Telephone Encounter (Signed)
 Pharmacy Patient Advocate Encounter   Received notification from Pt Calls Messages that prior authorization for Nurtec 75 is required/requested.   Insurance verification completed.   The patient is insured through [facility] .   Per test claim: PA required; PA submitted to above mentioned insurance via Latent Key/confirmation #/EOC [id number] Status is pending

## 2024-04-06 ENCOUNTER — Ambulatory Visit: Admitting: Family Medicine

## 2024-04-21 ENCOUNTER — Ambulatory Visit: Admitting: Obstetrics and Gynecology

## 2024-04-21 ENCOUNTER — Encounter: Payer: Self-pay | Admitting: Obstetrics and Gynecology

## 2024-04-21 VITALS — BP 131/77 | HR 94 | Ht 69.0 in | Wt 155.0 lb

## 2024-04-21 DIAGNOSIS — N644 Mastodynia: Secondary | ICD-10-CM

## 2024-04-21 DIAGNOSIS — M79621 Pain in right upper arm: Secondary | ICD-10-CM | POA: Diagnosis not present

## 2024-04-21 DIAGNOSIS — Z1231 Encounter for screening mammogram for malignant neoplasm of breast: Secondary | ICD-10-CM

## 2024-04-21 NOTE — Progress Notes (Signed)
 Janet Heron HERO, MD   Chief Complaint  Patient presents with   Breast Exam    Swollen lymph node under right armpit, some tenderness in RB on and off x 4-6 months.    HPI:      [name] is a 33 y.o. [id number] whose LMP was Patient's last menstrual period was 04/19/2024 (exact date)., presents today for RT axillary tenderness/swelling intermittently for the past 4-6 months; no discrete mass, no lesion, no d/c. Sometimes corresponds with RT breast fullness/tenderness. Doesn't seem related to menstrual cycle. No breast masses/erythema/nipple d/c. Drinks a lot of caffeine . No prior hx of breast masses/imaging. Had similar sx in LT breast 10/17 with neg axillary u/s.  No FH breast/ovar ca. No hx of infections, cuts, lesions RT arm/breast/chest area. No fevers/night sweats/LAN elsewhere. Normal CBC 1/25. NO SX TODAY. Menses are monthly, not on BC.   Patient Active Problem List   Diagnosis Date Noted   CIN III (cervical intraepithelial neoplasia grade III) with severe dysplasia 01/28/2022   Migraines 08/17/2017    Past Surgical History:  Procedure Laterality Date   CHOLECYSTECTOMY N/A 07/24/2023   Procedure: LAPAROSCOPIC CHOLECYSTECTOMY;  Surgeon: Lyndel Deward PARAS, MD;  Location: [facility];  Service: General;  Laterality: N/A;   LEEP N/A 12/30/2021   Procedure: LOOP ELECTROSURGICAL EXCISION PROCEDURE (LEEP);  Surgeon: Connell Davies, MD;  Location: [facility] ORS;  Service: Gynecology;  Laterality: N/A;   WISDOM TOOTH EXTRACTION      Family History  Problem Relation Age of Onset   Healthy Mother        heavy menses; early hysterectomy   High blood pressure Father    Alcohol abuse Maternal Grandfather        colon   Diabetes Paternal Grandmother    Colon cancer Maternal Aunt 5    Social History   Socioeconomic History   Marital status: Married    Spouse name: John   Number of children: 3   Years of education: Not on file   Highest education level: Bachelor's degree  (e.g., BA, AB, BS)  Occupational History   Not on file  Tobacco Use   Smoking status: Never   Smokeless tobacco: Never  Vaping Use   Vaping status: Never Used  Substance and Sexual Activity   Alcohol use: Not Currently    Comment: occasional   Drug use: No   Sexual activity: Yes    Birth control/protection: None  Other Topics Concern   Not on file  Social History Narrative   Work or School: RN pediatrics [employer]   Social Drivers of Health   Financial Resource Strain: Low Risk  (05/05/2023)   Overall Financial Resource Strain (CARDIA)    Difficulty of Paying Living Expenses: Not hard at all  Food Insecurity: No Food Insecurity (05/05/2023)   Hunger Vital Sign    Worried About Running Out of Food in the Last Year: Never true    Ran Out of Food in the Last Year: Never true  Transportation Needs: No Transportation Needs (05/05/2023)   PRAPARE - Administrator, Civil Service (Medical): No    Lack of Transportation (Non-Medical): No  Physical Activity: Sufficiently Active (05/05/2023)   Exercise Vital Sign    Days of Exercise per Week: 3 days    Minutes of Exercise per Session: 60 min  Stress: No Stress Concern Present (05/05/2023)   [employer] of Occupational Health - Occupational Stress Questionnaire    Feeling of Stress :  Only a little  Social Connections: Socially Integrated (05/05/2023)   Social Connection and Isolation Panel    Frequency of Communication with Friends and Family: Twice a week    Frequency of Social Gatherings with Friends and Family: Three times a week    Attends Religious Services: More than 4 times per year    Active Member of Clubs or Organizations: Yes    Attends Banker Meetings: More than 4 times per year    Marital Status: Married  Catering manager Violence: Not on file    Outpatient Medications Prior to Visit  Medication Sig Dispense Refill   Rimegepant Sulfate (NURTEC) 75 MG TBDP Take 1 tablet (75 mg total) by  mouth every other day. 15 tablet 2   amoxicillin -clavulanate (AUGMENTIN ) 875-125 MG tablet Take 1 tablet by mouth 2 (two) times daily. 20 tablet 0   ibuprofen  (ADVIL ) 200 MG tablet Take 400-600 mg by mouth every 6 (six) hours as needed for moderate pain (pain score 4-6).     sulfamethoxazole -trimethoprim  (BACTRIM  DS) 800-160 MG tablet Take 1 tablet by mouth 2 (two) times daily. 14 tablet 0   SUMAtriptan  (IMITREX ) 100 MG tablet Take 1 tablet (100 mg total) by mouth daily as needed for migraine. 10 tablet 2   No facility-administered medications prior to visit.      ROS:  Review of Systems  Constitutional:  Negative for fever.  Gastrointestinal:  Negative for blood in stool, constipation, diarrhea, nausea and vomiting.  Genitourinary:  Negative for dyspareunia, dysuria, flank pain, frequency, hematuria, urgency, vaginal bleeding, vaginal discharge and vaginal pain.  Musculoskeletal:  Negative for back pain.  Skin:  Negative for rash.   BREAST: No symptoms   OBJECTIVE:   Vitals:  BP 131/77   Pulse 94   Ht 5' 9 (1.753 m)   Wt 155 lb (70.3 kg)   LMP 04/19/2024 (Exact Date)   BMI 22.89 kg/m   Physical Exam Vitals reviewed.  Pulmonary:     Effort: Pulmonary effort is normal.  Chest:  Breasts:    Breasts are symmetrical.     Right: No inverted nipple, mass, nipple discharge, skin change or tenderness.     Left: No inverted nipple, mass, nipple discharge, skin change or tenderness.       Comments: NEG BREAST EXAM BILAT Musculoskeletal:        General: Normal range of motion.     Cervical back: Normal range of motion.  Skin:    General: Skin is warm and dry.  Neurological:     General: No focal deficit present.     Mental Status: She is alert and oriented to person, place, and time.     Cranial Nerves: No cranial nerve deficit.  Psychiatric:        Mood and Affect: Mood normal.        Behavior: Behavior normal.        Thought Content: Thought content normal.         Judgment: Judgment normal.    Assessment/Plan: Breast tenderness - Plan: MM 3D DIAGNOSTIC MAMMOGRAM BILATERAL BREAST, US  LIMITED ULTRASOUND INCLUDING AXILLA RIGHT BREAST, question hormonal; check dx mammo and u/s, will f/u with results. D/C caffeine . Keep sx diary in regards to ovulation/menses.   Axillary pain, right - Plan: MM 3D DIAGNOSTIC MAMMOGRAM BILATERAL BREAST, US  LIMITED ULTRASOUND INCLUDING AXILLA RIGHT BREAST, with swelling, question extra breast tissue responding to hormonal changes. Check mammo and u/s.   Encounter for screening mammogram for malignant neoplasm of  breast - Plan: MM 3D DIAGNOSTIC MAMMOGRAM BILATERAL BREAST, US  LIMITED ULTRASOUND INCLUDING AXILLA RIGHT BREAST, pt to schedule imaging   Return if symptoms worsen or fail to improve.  Saleh Ulbrich B. Edwing Figley, PA-C 04/21/2024 2:38 PM

## 2024-04-21 NOTE — Patient Instructions (Addendum)
 I value your feedback and you entrusting [place] with your care. If you get a [facility] patient survey, I would appreciate you taking the time to let us know about your experience today. Thank you!  [facility] ([place])--[phone]

## 2024-04-26 ENCOUNTER — Ambulatory Visit
Admission: RE | Admit: 2024-04-26 | Discharge: 2024-04-26 | Disposition: A | Discharge status: Home / Self Care | Source: Ambulatory Visit | Attending: Obstetrics and Gynecology | Admitting: Obstetrics and Gynecology

## 2024-04-26 ENCOUNTER — Ambulatory Visit: Payer: Self-pay | Admitting: Obstetrics and Gynecology

## 2024-04-26 DIAGNOSIS — R590 Localized enlarged lymph nodes: Secondary | ICD-10-CM | POA: Diagnosis not present

## 2024-04-26 DIAGNOSIS — N644 Mastodynia: Secondary | ICD-10-CM

## 2024-04-26 DIAGNOSIS — Z1231 Encounter for screening mammogram for malignant neoplasm of breast: Secondary | ICD-10-CM

## 2024-04-26 DIAGNOSIS — M79621 Pain in right upper arm: Secondary | ICD-10-CM | POA: Insufficient documentation

## 2024-04-26 DIAGNOSIS — R92343 Mammographic extreme density, bilateral breasts: Secondary | ICD-10-CM | POA: Diagnosis not present

## 2024-04-28 ENCOUNTER — Other Ambulatory Visit (HOSPITAL_COMMUNITY): Payer: Self-pay

## 2024-04-28 MED ORDER — FLUZONE 0.5 ML IM SUSY
0.5000 mL | PREFILLED_SYRINGE | Freq: Once | INTRAMUSCULAR | 0 refills | Status: AC
  Filled 2024-04-28: qty 0.5, 1d supply, fill #0

## 2024-06-05 NOTE — Progress Notes (Unsigned)
 PCP:  Ozell Heron HERO, MD   No chief complaint on file.    HPI:      [name] is a 33 y.o. [id number] whose LMP was No LMP recorded., presents today for her annual examination.  Her menses are {norm/abn:715}, lasting {number: 22536} days.  Dysmenorrhea {dysmen:716}. She {does:18564} have intermenstrual bleeding.  Sex activity: {sex active: 315163}. No pain/bleeding/dryness. Last Pap: 04/29/23  Results were:  NILM/neg HPV DNA  Hx of STDs: {STD hx:14358}  Last mammogram: 04/26/24 for RT breast fullness and RT axillary LAN, Results were: normal--routine follow-up age 43 There is no FH of breast cancer. There is no FH of ovarian cancer. The patient {does:18564} do self-breast exams.  Tobacco use: {tob:20664} Alcohol use: {Alcohol:11675} No drug use.  Exercise: {exercise:31265}  She {does:18564} get adequate calcium and Vitamin D  in her diet.  Patient Active Problem List   Diagnosis Date Noted   CIN III (cervical intraepithelial neoplasia grade III) with severe dysplasia 01/28/2022   Migraines 08/17/2017    Past Surgical History:  Procedure Laterality Date   CHOLECYSTECTOMY N/A 07/24/2023   Procedure: LAPAROSCOPIC CHOLECYSTECTOMY;  Surgeon: Lyndel Deward PARAS, MD;  Location: [facility];  Service: General;  Laterality: N/A;   LEEP N/A 12/30/2021   Procedure: LOOP ELECTROSURGICAL EXCISION PROCEDURE (LEEP);  Surgeon: Connell Davies, MD;  Location: [facility];  Service: Gynecology;  Laterality: N/A;   WISDOM TOOTH EXTRACTION      Family History  Problem Relation Age of Onset   Healthy Mother        heavy menses; early hysterectomy   High blood pressure Father    Alcohol abuse Maternal Grandfather        colon   Diabetes Paternal Grandmother    Colon cancer Maternal Aunt 2    Social History   Socioeconomic History   Marital status: Married    Spouse name: John   Number of children: 3   Years of education: Not on file   Highest education level: Bachelor's  degree (e.g., BA, AB, BS)  Occupational History   Not on file  Tobacco Use   Smoking status: Never   Smokeless tobacco: Never  Vaping Use   Vaping status: Never Used  Substance and Sexual Activity   Alcohol use: Not Currently    Comment: occasional   Drug use: No   Sexual activity: Yes    Birth control/protection: None  Other Topics Concern   Not on file  Social History Narrative   Work or School: RN pediatrics [employer]   Social Drivers of Health   Financial Resource Strain: Low Risk  (05/05/2023)   Overall Financial Resource Strain (CARDIA)    Difficulty of Paying Living Expenses: Not hard at all  Food Insecurity: No Food Insecurity (05/05/2023)   Hunger Vital Sign    Worried About Running Out of Food in the Last Year: Never true    Ran Out of Food in the Last Year: Never true  Transportation Needs: No Transportation Needs (05/05/2023)   PRAPARE - Administrator, Civil Service (Medical): No    Lack of Transportation (Non-Medical): No  Physical Activity: Sufficiently Active (05/05/2023)   Exercise Vital Sign    Days of Exercise per Week: 3 days    Minutes of Exercise per Session: 60 min  Stress: No Stress Concern Present (05/05/2023)   [employer] of Occupational Health - Occupational Stress Questionnaire    Feeling of Stress : Only a little  Social Connections:  Socially Integrated (05/05/2023)   Social Connection and Isolation Panel    Frequency of Communication with Friends and Family: Twice a week    Frequency of Social Gatherings with Friends and Family: Three times a week    Attends Religious Services: More than 4 times per year    Active Member of Clubs or Organizations: Yes    Attends Engineer, Structural: More than 4 times per year    Marital Status: Married  Catering Manager Violence: Not on file     Current Outpatient Medications:    Rimegepant Sulfate (NURTEC) 75 MG TBDP, Take 1 tablet (75 mg total) by mouth every other day.,  Disp: 15 tablet, Rfl: 2     ROS:  Review of Systems BREAST: No symptoms   Objective: There were no vitals taken for this visit.   OBGyn Exam  Results: No results found for this or any previous visit (from the past 24 hours).  Assessment/Plan: No diagnosis found.  No orders of the defined types were placed in this encounter.            GYN counsel {counseling: 16159}     F/U  No follow-ups on file.  Daquavion Catala B. Rozelia Catapano, PA-C 06/05/2024 7:45 PM

## 2024-06-06 ENCOUNTER — Ambulatory Visit: Admitting: Obstetrics and Gynecology

## 2024-06-06 ENCOUNTER — Other Ambulatory Visit (HOSPITAL_COMMUNITY)
Admission: RE | Admit: 2024-06-06 | Discharge: 2024-06-06 | Disposition: A | Discharge status: Home / Self Care | Source: Ambulatory Visit | Attending: Obstetrics and Gynecology | Admitting: Obstetrics and Gynecology

## 2024-06-06 ENCOUNTER — Encounter: Payer: Self-pay | Admitting: Obstetrics and Gynecology

## 2024-06-06 ENCOUNTER — Ambulatory Visit: Admitting: Obstetrics & Gynecology

## 2024-06-06 VITALS — BP 135/76 | HR 103 | Ht 69.0 in | Wt 158.0 lb

## 2024-06-06 DIAGNOSIS — Z01411 Encounter for gynecological examination (general) (routine) with abnormal findings: Secondary | ICD-10-CM | POA: Diagnosis not present

## 2024-06-06 DIAGNOSIS — N751 Abscess of Bartholin's gland: Secondary | ICD-10-CM

## 2024-06-06 DIAGNOSIS — N750 Cyst of Bartholin's gland: Secondary | ICD-10-CM

## 2024-06-06 DIAGNOSIS — D069 Carcinoma in situ of cervix, unspecified: Secondary | ICD-10-CM

## 2024-06-06 DIAGNOSIS — Z124 Encounter for screening for malignant neoplasm of cervix: Secondary | ICD-10-CM

## 2024-06-06 DIAGNOSIS — Z1151 Encounter for screening for human papillomavirus (HPV): Secondary | ICD-10-CM | POA: Diagnosis not present

## 2024-06-06 DIAGNOSIS — Z01419 Encounter for gynecological examination (general) (routine) without abnormal findings: Secondary | ICD-10-CM

## 2024-06-06 MED ORDER — DICLOXACILLIN SODIUM 500 MG PO CAPS: 500.0000 mg | ORAL_CAPSULE | Freq: Four times a day (QID) | ORAL | 0 refills | Status: AC

## 2024-06-06 NOTE — Patient Instructions (Signed)
 I value your feedback and you entrusting [place] with your care. If you get a [place] patient survey, I would appreciate you taking the time to let us know about your experience today. Thank you! ? ? ?

## 2024-06-06 NOTE — Progress Notes (Signed)
    GYNECOLOGY PROGRESS NOTE  Subjective:    Patient ID: Janet Thompson, female    DOB: March 17, 1991, 33 y.o.   MRN: [id number]  HPI  Patient is a 33 y.o. [id number] who has a 10 day h/o a left Bartholin's abscess. It is very painful and she is requesting an I&D. She had an I&D of the same cyst 08/2023. A Word catheter was not placed at that time.  The following portions of the patient's history were reviewed and updated as appropriate: allergies, current medications, past family history, past medical history, past social history, past surgical history, and problem list.  Review of Systems Pertinent items are noted in HPI.  She is a engineer, civil (consulting) at [employer], production designer, theatre/television/film.  Objective:   Last menstrual period 05/17/2024. There is no height or weight on file to calculate BMI. Well nourished, well hydrated White female, no apparent distress She is ambulating and conversing normally. She has an obvious left Bartholin's abscess. I prepped the area with Hurricaine spray and then betadine after consenting her for an I&D.  I then injected 3 mL of 1% lidocaine  with a 25 gauge needle. Once adequate anesthesia was assured, I made a stab incision into the abscess. A large amount of pus was expelled. I then used a small hemostat to assure that there were no more pockets of pus. I then placed a Word catheter and inflated it with 2 mL of saline. There was a significant amount of induration of the left labia majora even after the I&D. She tolerated the procedure well.   Assessment:  Left Bartholin's abscess (second time)  Plan:   Now s/p I&D I will treat with dicloxacillin  due to the marked induration of the left labia majora.

## 2024-06-09 LAB — CYTOLOGY - PAP
Comment: NEGATIVE
Diagnosis: NEGATIVE
Diagnosis: REACTIVE
High risk HPV: NEGATIVE

## 2024-06-13 ENCOUNTER — Encounter: Payer: Self-pay | Admitting: Obstetrics and Gynecology
